# Patient Record
Sex: Female | Born: 1955 | Race: Black or African American | Hispanic: No | Marital: Married | Smoking: Never smoker
Health system: Southern US, Community
[De-identification: ages and names within clinical notes are randomized; demographics above are authoritative.]

## PROBLEM LIST (undated history)

## (undated) DIAGNOSIS — J45991 Cough variant asthma: Secondary | ICD-10-CM

## (undated) DIAGNOSIS — I10 Essential (primary) hypertension: Secondary | ICD-10-CM

## (undated) DIAGNOSIS — J320 Chronic maxillary sinusitis: Secondary | ICD-10-CM

## (undated) DIAGNOSIS — I7300 Raynaud's syndrome without gangrene: Secondary | ICD-10-CM

## (undated) DIAGNOSIS — R928 Other abnormal and inconclusive findings on diagnostic imaging of breast: Secondary | ICD-10-CM

## (undated) DIAGNOSIS — K449 Diaphragmatic hernia without obstruction or gangrene: Secondary | ICD-10-CM

## (undated) DIAGNOSIS — K648 Other hemorrhoids: Secondary | ICD-10-CM

## (undated) DIAGNOSIS — K219 Gastro-esophageal reflux disease without esophagitis: Secondary | ICD-10-CM

## (undated) HISTORY — DX: Other abnormal and inconclusive findings on diagnostic imaging of breast: R92.8

## (undated) HISTORY — DX: Essential (primary) hypertension: I10

## (undated) HISTORY — DX: Diaphragmatic hernia without obstruction or gangrene: K44.9

## (undated) HISTORY — DX: Chronic maxillary sinusitis: J32.0

## (undated) HISTORY — DX: Cough variant asthma: J45.991

## (undated) HISTORY — DX: Raynaud's syndrome without gangrene: I73.00

## (undated) HISTORY — DX: Gastro-esophageal reflux disease without esophagitis: K21.9

## (undated) HISTORY — DX: Other hemorrhoids: K64.8

## (undated) HISTORY — PX: CHOLECYSTECTOMY: SHX55

---

## 1992-09-23 HISTORY — PX: KNEE SURGERY: SHX244

## 1994-09-23 HISTORY — PX: OTHER SURGICAL HISTORY: SHX169

## 2001-05-21 ENCOUNTER — Ambulatory Visit (HOSPITAL_COMMUNITY): Admission: RE | Admit: 2001-05-21 | Discharge: 2001-05-21 | Payer: Self-pay | Admitting: Gastroenterology

## 2001-05-21 ENCOUNTER — Encounter: Payer: Self-pay | Admitting: Gastroenterology

## 2001-09-15 ENCOUNTER — Emergency Department (HOSPITAL_COMMUNITY): Admission: EM | Admit: 2001-09-15 | Discharge: 2001-09-15 | Payer: Self-pay | Admitting: Emergency Medicine

## 2002-03-15 ENCOUNTER — Encounter: Payer: Self-pay | Admitting: Gastroenterology

## 2002-03-15 ENCOUNTER — Ambulatory Visit (HOSPITAL_COMMUNITY): Admission: RE | Admit: 2002-03-15 | Discharge: 2002-03-15 | Payer: Self-pay | Admitting: Gastroenterology

## 2004-11-30 ENCOUNTER — Ambulatory Visit: Payer: Self-pay | Admitting: Internal Medicine

## 2005-01-17 ENCOUNTER — Ambulatory Visit: Payer: Self-pay | Admitting: Internal Medicine

## 2005-03-20 ENCOUNTER — Ambulatory Visit: Payer: Self-pay | Admitting: Internal Medicine

## 2005-06-05 ENCOUNTER — Ambulatory Visit: Payer: Self-pay | Admitting: Internal Medicine

## 2005-06-25 ENCOUNTER — Ambulatory Visit: Payer: Self-pay | Admitting: Internal Medicine

## 2005-07-26 ENCOUNTER — Ambulatory Visit: Payer: Self-pay | Admitting: Internal Medicine

## 2005-07-31 ENCOUNTER — Ambulatory Visit: Payer: Self-pay | Admitting: Internal Medicine

## 2005-09-26 ENCOUNTER — Ambulatory Visit: Payer: Self-pay | Admitting: Internal Medicine

## 2005-11-13 ENCOUNTER — Ambulatory Visit: Payer: Self-pay | Admitting: Internal Medicine

## 2005-12-11 ENCOUNTER — Ambulatory Visit: Payer: Self-pay | Admitting: Internal Medicine

## 2006-02-28 ENCOUNTER — Ambulatory Visit: Payer: Self-pay | Admitting: Internal Medicine

## 2006-04-07 ENCOUNTER — Ambulatory Visit: Payer: Self-pay | Admitting: Internal Medicine

## 2006-06-24 ENCOUNTER — Ambulatory Visit: Payer: Self-pay | Admitting: Gastroenterology

## 2006-09-11 ENCOUNTER — Ambulatory Visit: Payer: Self-pay | Admitting: Family Medicine

## 2006-09-30 ENCOUNTER — Encounter: Payer: Self-pay | Admitting: Internal Medicine

## 2006-09-30 ENCOUNTER — Ambulatory Visit: Payer: Self-pay | Admitting: Gastroenterology

## 2006-09-30 LAB — HM COLONOSCOPY

## 2006-11-28 ENCOUNTER — Ambulatory Visit: Payer: Self-pay | Admitting: Internal Medicine

## 2006-12-30 ENCOUNTER — Ambulatory Visit: Payer: Self-pay | Admitting: Internal Medicine

## 2007-07-16 ENCOUNTER — Encounter: Payer: Self-pay | Admitting: Internal Medicine

## 2007-07-28 ENCOUNTER — Encounter: Payer: Self-pay | Admitting: Internal Medicine

## 2007-12-15 ENCOUNTER — Ambulatory Visit: Payer: Self-pay | Admitting: Internal Medicine

## 2007-12-15 DIAGNOSIS — J309 Allergic rhinitis, unspecified: Secondary | ICD-10-CM | POA: Insufficient documentation

## 2007-12-15 DIAGNOSIS — J320 Chronic maxillary sinusitis: Secondary | ICD-10-CM

## 2007-12-15 DIAGNOSIS — K648 Other hemorrhoids: Secondary | ICD-10-CM

## 2007-12-15 DIAGNOSIS — K219 Gastro-esophageal reflux disease without esophagitis: Secondary | ICD-10-CM | POA: Insufficient documentation

## 2007-12-15 DIAGNOSIS — K449 Diaphragmatic hernia without obstruction or gangrene: Secondary | ICD-10-CM

## 2007-12-15 DIAGNOSIS — I10 Essential (primary) hypertension: Secondary | ICD-10-CM | POA: Insufficient documentation

## 2007-12-15 DIAGNOSIS — I7300 Raynaud's syndrome without gangrene: Secondary | ICD-10-CM

## 2007-12-15 HISTORY — DX: Other hemorrhoids: K64.8

## 2007-12-15 HISTORY — DX: Chronic maxillary sinusitis: J32.0

## 2007-12-15 HISTORY — DX: Diaphragmatic hernia without obstruction or gangrene: K44.9

## 2007-12-15 HISTORY — DX: Raynaud's syndrome without gangrene: I73.00

## 2008-01-12 ENCOUNTER — Ambulatory Visit: Payer: Self-pay | Admitting: Internal Medicine

## 2008-05-10 ENCOUNTER — Encounter: Payer: Self-pay | Admitting: Internal Medicine

## 2008-05-23 ENCOUNTER — Ambulatory Visit: Payer: Self-pay | Admitting: Internal Medicine

## 2008-08-01 ENCOUNTER — Telehealth: Payer: Self-pay | Admitting: *Deleted

## 2008-08-01 DIAGNOSIS — R928 Other abnormal and inconclusive findings on diagnostic imaging of breast: Secondary | ICD-10-CM

## 2008-08-01 HISTORY — DX: Other abnormal and inconclusive findings on diagnostic imaging of breast: R92.8

## 2008-08-03 ENCOUNTER — Encounter: Payer: Self-pay | Admitting: Internal Medicine

## 2008-08-09 ENCOUNTER — Ambulatory Visit: Payer: Self-pay | Admitting: Internal Medicine

## 2008-08-09 LAB — CONVERTED CEMR LAB
BUN: 10 mg/dL (ref 6–23)
Basophils Absolute: 0 10*3/uL (ref 0.0–0.1)
Basophils Relative: 0.5 % (ref 0.0–3.0)
CO2: 30 meq/L (ref 19–32)
Calcium: 9 mg/dL (ref 8.4–10.5)
Chloride: 107 meq/L (ref 96–112)
Creatinine, Ser: 0.8 mg/dL (ref 0.4–1.2)
Eosinophils Absolute: 0.1 10*3/uL (ref 0.0–0.7)
Eosinophils Relative: 2.4 % (ref 0.0–5.0)
GFR calc Af Amer: 97 mL/min
GFR calc non Af Amer: 80 mL/min
Glucose, Bld: 91 mg/dL (ref 70–99)
HCT: 38.1 % (ref 36.0–46.0)
Hemoglobin: 13 g/dL (ref 12.0–15.0)
Hgb A1c MFr Bld: 5.8 % (ref 4.6–6.0)
Lymphocytes Relative: 42.6 % (ref 12.0–46.0)
MCHC: 34.1 g/dL (ref 30.0–36.0)
MCV: 89 fL (ref 78.0–100.0)
Monocytes Absolute: 0.3 10*3/uL (ref 0.1–1.0)
Monocytes Relative: 6.4 % (ref 3.0–12.0)
Neutro Abs: 1.9 10*3/uL (ref 1.4–7.7)
Neutrophils Relative %: 48.1 % (ref 43.0–77.0)
Platelets: 215 10*3/uL (ref 150–400)
Potassium: 3.5 meq/L (ref 3.5–5.1)
RBC: 4.28 M/uL (ref 3.87–5.11)
RDW: 12.6 % (ref 11.5–14.6)
Sodium: 142 meq/L (ref 135–145)
TSH: 1.41 microintl units/mL (ref 0.35–5.50)
WBC: 4 10*3/uL — ABNORMAL LOW (ref 4.5–10.5)

## 2008-09-12 ENCOUNTER — Telehealth: Payer: Self-pay | Admitting: Internal Medicine

## 2008-10-19 ENCOUNTER — Ambulatory Visit: Payer: Self-pay | Admitting: Internal Medicine

## 2008-10-19 DIAGNOSIS — J45991 Cough variant asthma: Secondary | ICD-10-CM

## 2008-10-19 HISTORY — DX: Cough variant asthma: J45.991

## 2009-06-12 ENCOUNTER — Emergency Department (HOSPITAL_COMMUNITY): Admission: EM | Admit: 2009-06-12 | Discharge: 2009-06-12 | Payer: Self-pay | Admitting: Emergency Medicine

## 2009-09-04 ENCOUNTER — Ambulatory Visit: Payer: Self-pay | Admitting: Internal Medicine

## 2009-09-04 ENCOUNTER — Telehealth: Payer: Self-pay | Admitting: Internal Medicine

## 2009-11-20 ENCOUNTER — Encounter: Payer: Self-pay | Admitting: Internal Medicine

## 2010-02-06 ENCOUNTER — Ambulatory Visit: Payer: Self-pay | Admitting: Family Medicine

## 2010-05-02 ENCOUNTER — Ambulatory Visit: Payer: Self-pay | Admitting: Internal Medicine

## 2010-05-02 DIAGNOSIS — M25579 Pain in unspecified ankle and joints of unspecified foot: Secondary | ICD-10-CM | POA: Insufficient documentation

## 2010-05-03 ENCOUNTER — Ambulatory Visit: Payer: Self-pay | Admitting: Internal Medicine

## 2010-06-06 ENCOUNTER — Ambulatory Visit: Payer: Self-pay | Admitting: Internal Medicine

## 2010-06-20 DIAGNOSIS — S335XXA Sprain of ligaments of lumbar spine, initial encounter: Secondary | ICD-10-CM | POA: Insufficient documentation

## 2010-07-14 ENCOUNTER — Ambulatory Visit: Payer: Self-pay | Admitting: Family Medicine

## 2010-07-16 ENCOUNTER — Telehealth: Payer: Self-pay | Admitting: Internal Medicine

## 2010-09-05 ENCOUNTER — Ambulatory Visit: Payer: Self-pay | Admitting: Internal Medicine

## 2010-10-23 NOTE — Assessment & Plan Note (Signed)
Summary: 1 month rov/njr   Vital Signs:  Patient profile:   55 year old female Height:      67 inches Weight:      218 pounds BMI:     34.27 Temp:     98.2 degrees F oral Pulse rate:   76 / minute Resp:     14 per minute BP sitting:   140 / 80  (left arm)  Vitals Entered By: Willy Eddy, [name] (June 06, 2010 4:45 PM) CC: roa- pt states she doesnt take any meds and didnt get prednisone or flexeril filled-requesting lxray results Is Patient Diabetic? No   Primary Care Provider:  Stacie Glaze MD  CC:  roa- pt states she doesnt take any meds and didnt get prednisone or flexeril filled-requesting lxray results.  History of Present Illness: pt had presented with acute back pain with symptosm consistant with sciatica she was given a course of steroids and muscle relaxants and lumbar films with flexion and extension were ordered she did not start the steroids the back pain has resolved and the xray did not show evidence of loss of disc height or spodylosis she has a hx of recurrent muscle spasm of back   Preventive Screening-Counseling & Management  Alcohol-Tobacco     Smoking Status: never  Problems Prior to Update: 1)  Ankle Pain, Right  (ICD-719.47) 2)  Sciatica  (ICD-724.3) 3)  Acute Sinusitis, Unspecified  (ICD-461.9) 4)  Cough Variant Asthma  (ICD-493.82) 5)  Chronic Maxillary Sinusitis  (ICD-473.0) 6)  Mammogram, Abnormal  (ICD-793.80) 7)  Weight Gain  (ICD-783.1) 8)  Chronic Maxillary Sinusitis  (ICD-473.0) 9)  Family History Diabetes 1st Degree Relative  (ICD-V18.0) 10)  Hypertension  (ICD-401.9) 11)  Raynaud's Disease  (ICD-443.0) 12)  Hiatal Hernia  (ICD-553.3) 13)  Hemorrhoids, Internal  (ICD-455.0) 14)  Allergic Rhinitis  (ICD-477.9) 15)  Gerd  (ICD-530.81)  Medications Prior to Update: 1)  Omeprazole 40 Mg Cpdr (Omeprazole) .... One By Mouth Daily 2)  Singulair 10 Mg  Tabs (Montelukast Sodium) .... Once Daily As Needed 3)  Cyclobenzaprine Hcl  10 Mg Tabs (Cyclobenzaprine Hcl) .... One By Mouth Three Times A Day For 10 Days 4)  Prednisone 20 Mg Tabs (Prednisone) .... Two By Mouth For 4 Days The 1 By Mouth For 4 Days The 1/2 By Mouth For 4 Days 5)  Lodrane 24 12 Mg Xr24h-Cap (Brompheniramine Maleate) .... One By Mouth Daily  Current Medications (verified): 1)  Singulair 10 Mg  Tabs (Montelukast Sodium) .... Once Daily As Needed 2)  Lodrane 24 12 Mg Xr24h-Cap (Brompheniramine Maleate) .... One By Mouth Daily As Needed 3)  Etodolac 300 Mg Caps (Etodolac) .... One By Mouth Two Times A Day  Call If This Medication Causes Stomach Pain 4)  Cyclobenzaprine Hcl 10 Mg Tabs (Cyclobenzaprine Hcl) .... One By Mouth Three Times A Day As Needed Spasm  Allergies (verified): No Known Drug Allergies  Past History:  Family History: Last updated: 12/15/2007 Father: HTN Mother: [id number] PULSE Siblings: 2 brothers and 2 sisters Family History Diabetes 1st degree relative Family History Hypertension  Social History: Last updated: 12/15/2007 Married Never Smoked  Risk Factors: Smoking Status: never (06/06/2010)  Past medical, surgical, family and social histories (including risk factors) reviewed, and no changes noted (except as noted below).  Past Medical History: Reviewed history from 12/15/2007 and no changes required. GERD Allergic rhinitis Hypertension  Past Surgical History: Reviewed history from 12/15/2007 and no changes required. Cholecystectomy 1995 knee surgery  1994 trauma to arm in machine 1996  Family History: Reviewed history from 12/15/2007 and no changes required. Father: HTN Mother: [id number] PULSE Siblings: 2 brothers and 2 sisters Family History Diabetes 1st degree relative Family History Hypertension  Social History: Reviewed history from 12/15/2007 and no changes required. Married Never Smoked  Review of Systems  The patient denies anorexia, fever, weight loss, weight gain, vision loss,  decreased hearing, hoarseness, chest pain, syncope, dyspnea on exertion, peripheral edema, prolonged cough, headaches, hemoptysis, abdominal pain, melena, hematochezia, severe indigestion/heartburn, hematuria, incontinence, genital sores, muscle weakness, suspicious skin lesions, transient blindness, difficulty walking, depression, unusual weight change, abnormal bleeding, enlarged lymph nodes, angioedema, and breast masses.    Physical Exam  General:  Well-developed,well-nourished,in no acute distress; alert,appropriate and cooperative throughout examination Head:  Normocephalic and atraumatic without obvious abnormalities. No apparent alopecia or balding. Eyes:  pupils equal and pupils round.   Ears:  R ear normal and L ear normal.   Neck:  No deformities, masses, or tenderness noted. Lungs:  Normal respiratory effort, chest expands symmetrically. Lungs are clear to auscultation, no crackles or wheezes. Heart:  Normal rate and regular rhythm. S1 and S2 normal without gallop, murmur, click, rub or other extra sounds. Abdomen:  Bowel sounds positive,abdomen soft and non-tender without masses, organomegaly or hernias noted. Msk:  no joint instability and trigger point tenderness.   Extremities:  No clubbing, cyanosis, edema, or deformity noted with normal full range of motion of all joints.   Neurologic:  No cranial nerve deficits noted. Station and gait are normal. Plantar reflexes are down-going bilaterally. DTRs are symmetrical throughout. Sensory, motor and coordinative functions appear intact.   Impression & Recommendations:  Problem # 1:  SCIATICA (ICD-724.3) the pt  did not complete the medication she feels better, diagnosis revied to muscel spasms The following medications were removed from the medication list:    Cyclobenzaprine Hcl 10 Mg Tabs (Cyclobenzaprine hcl) ..... One by mouth three times a day for 10 days Her updated medication list for this problem includes:    Etodolac 300  Mg Caps (Etodolac) ..... One by mouth two times a day  call if this medication causes stomach pain    Cyclobenzaprine Hcl 10 Mg Tabs (Cyclobenzaprine hcl) ..... One by mouth three times a day as needed spasm  Discussed use of moist heat or ice, modified activities, medications, and stretching/strengthening exercises. Back care instructions given. To be seen in 2 weeks if no improvement; sooner if worsening of symptoms.   Problem # 2:  HYPERTENSION (ICD-401.9) Assessment: Unchanged  BP today: 140/80 Prior BP: 130/80 (05/02/2010)  Prior 10 Yr Risk Heart Disease: Not enough information (12/15/2007)  Labs Reviewed: K+: 3.5 (08/09/2008) Creat: : 0.8 (08/09/2008)     Problem # 3:  LUMBAR STRAIN (ICD-847.2) posture, core strengthening and strething  Complete Medication List: 1)  Singulair 10 Mg Tabs (Montelukast sodium) .... Once daily as needed 2)  Lodrane 24 12 Mg Xr24h-cap (Brompheniramine maleate) .... One by mouth daily as needed 3)  Etodolac 300 Mg Caps (Etodolac) .... One by mouth two times a day  call if this medication causes stomach pain 4)  Cyclobenzaprine Hcl 10 Mg Tabs (Cyclobenzaprine hcl) .... One by mouth three times a day as needed spasm  Patient Instructions: 1)  Please schedule a follow-up appointment in 3 months. 2)  use peroxide and warm water to clean ears Prescriptions: CYCLOBENZAPRINE HCL 10 MG TABS (CYCLOBENZAPRINE HCL) one by mouth three times a day as needed spasm  #  30 x 0   Entered and Authorized by:   Stacie Glaze MD   Signed by:   Stacie Glaze MD on 06/06/2010   Method used:   Electronically to        [facility] (retail)       [address]       [place], [place]  [address]       Ph: [phone]       Fax: [phone]   RxID:   [id number] ETODOLAC 300 MG CAPS (ETODOLAC) one by mouth two times a day  call if this medication causes stomach pain  #60 x 3   Entered and Authorized by:   Stacie Glaze MD   Signed  by:   Stacie Glaze MD on 06/06/2010   Method used:   Electronically to        [facility] (retail)       [address]       [place], [place]  [address]       Ph: [phone]       Fax: [phone]   RxID:   [id number]

## 2010-10-23 NOTE — Assessment & Plan Note (Signed)
Summary: consult re: pain in lft/cjr   Vital Signs:  Patient profile:   55 year old female Height:      67 inches Weight:      214 pounds BMI:     33.64 Temp:     98.2 degrees F oral Pulse rate:   76 / minute Resp:     14 per minute BP sitting:   130 / 80  (left arm)  Vitals Entered By: Willy Eddy, [name] (May 02, 2010 4:32 PM) CC: c/o bilateral leg pain Is Patient Diabetic? No   CC:  c/o bilateral leg pain.  History of Present Illness: severe myalgias in legs sinus pain the left leg feels "sore to the bone" all down the legs the right leg hurts at the ankle she has no shoulder pain or arm pain she has mild SI joint pain and pan n the left groin  the pain interfers with sleep  she lost her mother 2 weeks ago and has increased grief  Preventive Screening-Counseling & Management  Alcohol-Tobacco     Smoking Status: never  Problems Prior to Update: 1)  Sciatica  (ICD-724.3) 2)  Acute Sinusitis, Unspecified  (ICD-461.9) 3)  Cough Variant Asthma  (ICD-493.82) 4)  Chronic Maxillary Sinusitis  (ICD-473.0) 5)  Mammogram, Abnormal  (ICD-793.80) 6)  Weight Gain  (ICD-783.1) 7)  Chronic Maxillary Sinusitis  (ICD-473.0) 8)  Family History Diabetes 1st Degree Relative  (ICD-V18.0) 9)  Hypertension  (ICD-401.9) 10)  Raynaud's Disease  (ICD-443.0) 11)  Hiatal Hernia  (ICD-553.3) 12)  Hemorrhoids, Internal  (ICD-455.0) 13)  Allergic Rhinitis  (ICD-477.9) 14)  Gerd  (ICD-530.81)  Current Problems (verified): 1)  Sciatica  (ICD-724.3) 2)  Acute Sinusitis, Unspecified  (ICD-461.9) 3)  Cough Variant Asthma  (ICD-493.82) 4)  Chronic Maxillary Sinusitis  (ICD-473.0) 5)  Mammogram, Abnormal  (ICD-793.80) 6)  Weight Gain  (ICD-783.1) 7)  Chronic Maxillary Sinusitis  (ICD-473.0) 8)  Family History Diabetes 1st Degree Relative  (ICD-V18.0) 9)  Hypertension  (ICD-401.9) 10)  Raynaud's Disease  (ICD-443.0) 11)  Hiatal Hernia  (ICD-553.3) 12)  Hemorrhoids, Internal   (ICD-455.0) 13)  Allergic Rhinitis  (ICD-477.9) 14)  Gerd  (ICD-530.81)  Medications Prior to Update: 1)  Protonix 40 Mg  Tbec (Pantoprazole Sodium) .... Once Daily 2)  Singulair 10 Mg  Tabs (Montelukast Sodium) .... Once Daily 3)  Patanol 0.1 % Soln (Olopatadine Hcl) .... Two Sprays in Nostril Daily 4)  Amoxicillin-Pot Clavulanate 875-125 Mg Tabs (Amoxicillin-Pot Clavulanate) .... One By Mouth Two Times A Day For 10 Mdays 5)  Atuss Ds 30-4-30 Mg/96ml Susp (Pseudoephed Hcl-Cpm-Dm Hbr Tan) .... Two Tsp By Mouth Two Times A Day 6)  Lansoprazole 30 Mg Cpdr (Lansoprazole) .... One By Mouth Daily  Current Medications (verified): 1)  Omeprazole 40 Mg Cpdr (Omeprazole) .... One By Mouth Daily 2)  Singulair 10 Mg  Tabs (Montelukast Sodium) .... Once Daily As Needed 3)  Cyclobenzaprine Hcl 10 Mg Tabs (Cyclobenzaprine Hcl) .... One By Mouth Three Times A Day For 10 Days 4)  Prednisone 20 Mg Tabs (Prednisone) .... Two By Mouth For 4 Days The 1 By Mouth For 4 Days The 1/2 By Mouth For 4 Days  Allergies (verified): No Known Drug Allergies  Past History:  Family History: Last updated: 12/15/2007 Father: HTN Mother: [id number] PULSE Siblings: 2 brothers and 2 sisters Family History Diabetes 1st degree relative Family History Hypertension  Social History: Last updated: 12/15/2007 Married Never Smoked  Risk Factors: Smoking Status: never (05/02/2010)  Past medical, surgical, family and social histories (including risk factors) reviewed, and no changes noted (except as noted below).  Past Medical History: Reviewed history from 12/15/2007 and no changes required. GERD Allergic rhinitis Hypertension  Past Surgical History: Reviewed history from 12/15/2007 and no changes required. Cholecystectomy 1995 knee surgery 1994 trauma to arm in machine 1996  Family History: Reviewed history from 12/15/2007 and no changes required. Father: HTN Mother: ZO:[id number] PULSE Siblings: 2  brothers and 2 sisters Family History Diabetes 1st degree relative Family History Hypertension  Social History: Reviewed history from 12/15/2007 and no changes required. Married Never Smoked  Review of Systems       The patient complains of hoarseness.  The patient denies anorexia, fever, weight loss, weight gain, vision loss, decreased hearing, chest pain, syncope, dyspnea on exertion, peripheral edema, prolonged cough, headaches, hemoptysis, abdominal pain, melena, hematochezia, severe indigestion/heartburn, hematuria, incontinence, genital sores, muscle weakness, suspicious skin lesions, transient blindness, difficulty walking, depression, unusual weight change, abnormal bleeding, enlarged lymph nodes, angioedema, and breast masses.         leg pain  Physical Exam  General:  Well-developed,well-nourished,in no acute distress; alert,appropriate and cooperative throughout examination Head:  Normocephalic and atraumatic without obvious abnormalities. No apparent alopecia or balding. Eyes:  pupils equal and pupils round.   Ears:  R ear normal and L ear normal.   Nose:  swollen nasal mucosa otherwise no acute change Neck:  No deformities, masses, or tenderness noted. Lungs:  Normal respiratory effort, chest expands symmetrically. Lungs are clear to auscultation, no crackles or wheezes. Heart:  Normal rate and regular rhythm. S1 and S2 normal without gallop, murmur, click, rub or other extra sounds. Abdomen:  Bowel sounds positive,abdomen soft and non-tender without masses, organomegaly or hernias noted. Msk:  normal ROM and no joint instability.   Extremities:  No clubbing, cyanosis, edema, or deformity noted with normal full range of motion of all joints.   Neurologic:  alert & oriented X3, cranial nerves II-XII intact, strength normal in all extremities, sensation intact to light touch, and DTRs symmetrical and normal.     Impression & Recommendations:  Problem # 1:  HYPERTENSION  (ICD-401.9)  BP today: 130/80 Prior BP: 110/80 (02/06/2010)  Prior 10 Yr Risk Heart Disease: Not enough information (12/15/2007)  Labs Reviewed: K+: 3.5 (08/09/2008) Creat: : 0.8 (08/09/2008)     Problem # 2:  SCIATICA (ICD-724.3)  Discussed use of moist heat or ice, modified activities, medications, and stretching/strengthening exercises. Back care instructions given. To be seen in 2 weeks if no improvement; sooner if worsening of symptoms.   Her updated medication list for this problem includes:    Cyclobenzaprine Hcl 10 Mg Tabs (Cyclobenzaprine hcl) ..... One by mouth three times a day for 10 days  Orders: T-Lumbar Spine w/Flex & Ext 4 Views (72120TC)  Problem # 3:  HIATAL HERNIA (ICD-553.3)  The following medications were removed from the medication list:    Lansoprazole 30 Mg Cpdr (Lansoprazole) ..... One by mouth daily Her updated medication list for this problem includes:    Omeprazole 40 Mg Cpdr (Omeprazole) ..... One by mouth daily  Labs Reviewed: Hgb: 13.0 (08/09/2008)   Hct: 38.1 (08/09/2008)  Problem # 4:  ANKLE PAIN, RIGHT (ICD-719.47)  wrapped  Orders: [place] Wrap,  <3 in/yd (J8119)  Complete Medication List: 1)  Omeprazole 40 Mg Cpdr (Omeprazole) .... One by mouth daily 2)  Singulair 10 Mg Tabs (Montelukast sodium) .... Once daily as needed 3)  Cyclobenzaprine Hcl  10 Mg Tabs (Cyclobenzaprine hcl) .... One by mouth three times a day for 10 days 4)  Prednisone 20 Mg Tabs (Prednisone) .... Two by mouth for 4 days the 1 by mouth for 4 days the 1/2 by mouth for 4 days 5)  Lodrane 24 12 Mg Xr24h-cap (Brompheniramine maleate) .... One by mouth daily  Patient Instructions: 1)  Please schedule a follow-up appointment in 1 month. Prescriptions: OMEPRAZOLE 40 MG CPDR (OMEPRAZOLE) one by mouth daily  #30 x 3   Entered and Authorized by:   Stacie Glaze MD   Signed by:   Stacie Glaze MD on 05/02/2010   Method used:   Electronically to        [facility] (retail)       [address]       [place], [place]  [address]       Ph: [phone]       Fax: [phone]   RxID:   [id number] PREDNISONE 20 MG TABS (PREDNISONE) two by mouth for 4 days the 1 by mouth for 4 days the 1/2 by mouth for 4 days  #14 x 0   Entered and Authorized by:   Stacie Glaze MD   Signed by:   Stacie Glaze MD on 05/02/2010   Method used:   Electronically to        [facility] (retail)       [address]       [place], [place]  [address]       Ph: [phone]       Fax: [phone]   RxID:   [id number] CYCLOBENZAPRINE HCL 10 MG TABS (CYCLOBENZAPRINE HCL) one by mouth three times a day for 10 days  #30 x 0   Entered and Authorized by:   Stacie Glaze MD   Signed by:   Stacie Glaze MD on 05/02/2010   Method used:   Electronically to        [facility] (retail)       [address]       [place], [place]  [address]       Ph: [phone]       Fax: [phone]   RxID:   [id number]

## 2010-10-23 NOTE — Assessment & Plan Note (Signed)
Summary: SINUSITIS// RS   Vital Signs:  Patient profile:   55 year old female Temp:     98.3 degrees F oral BP sitting:   110 / 80  (left arm) Cuff size:   regular  Vitals Entered By: Sid Falcon [name] (Feb 06, 2010 11:37 AM) CC: sinusitis   History of Present Illness: Patient seen for the following items.  One-month history of sinus pressure, pain, and nasal congestion. Intermittent greenish nasal discharge. Mostly postnasal drainage. Intermittent cough. Intermittent headache. Increased malaise. No fever or chills. Claritin without relief. History of frequent sinusitis in past. Nonsmoker. Nasal saline irrigation without improvement.  One to two-week history left lower extremity pain radiating from the lower lumbar area. Achy pain 7/10 at worst. Occasional numbness left foot. No weakness. No incontinence. Has not tried any medications. Possibly exacerbated by sitting. No history of chronic pain in back.  no appetite or weight changes  Allergies (verified): No Known Drug Allergies  Past History:  Past Medical History: Last updated: 12/15/2007 GERD Allergic rhinitis Hypertension  Past Surgical History: Last updated: 12/15/2007 Cholecystectomy 1995 knee surgery 1994 trauma to arm in machine 1996  Family History: Last updated: 12/15/2007 Father: HTN Mother: [id number] PULSE Siblings: 2 brothers and 2 sisters Family History Diabetes 1st degree relative Family History Hypertension  Social History: Last updated: 12/15/2007 Married Never Smoked  Risk Factors: Smoking Status: never (12/15/2007)  Review of Systems  The patient denies anorexia, fever, weight loss, chest pain, syncope, dyspnea on exertion, peripheral edema, prolonged cough, abdominal pain, melena, hematochezia, hematuria, incontinence, and muscle weakness.    Physical Exam  General:  Well-developed,well-nourished,in no acute distress; alert,appropriate and cooperative throughout examination Ears:   External ear exam shows no significant lesions or deformities.  Otoscopic examination reveals clear canals, tympanic membranes are intact bilaterally without bulging, retraction, inflammation or discharge. Hearing is grossly normal bilaterally. Nose:  swollen nasal mucosa otherwise no acute change Mouth:  Oral mucosa and oropharynx without lesions or exudates.  Teeth in good repair. Neck:  No deformities, masses, or tenderness noted. Lungs:  Normal respiratory effort, chest expands symmetrically. Lungs are clear to auscultation, no crackles or wheezes. Heart:  Normal rate and regular rhythm. S1 and S2 normal without gallop, murmur, click, rub or other extra sounds. Extremities:  No clubbing, cyanosis, edema, or deformity noted with normal full range of motion of all joints.   Neurologic:  alert & oriented X3, cranial nerves II-XII intact, strength normal in all extremities, sensation intact to light touch, and DTRs symmetrical and normal.     Impression & Recommendations:  Problem # 1:  ACUTE SINUSITIS, UNSPECIFIED (ICD-461.9) Assessment New  Her updated medication list for this problem includes:    Amoxicillin-pot Clavulanate 875-125 Mg Tabs (Amoxicillin-pot clavulanate) ..... One by mouth two times a day for 10 mdays    Atuss Ds 30-4-30 Mg/46ml Susp (Pseudoephed hcl-cpm-dm hbr tan) .Marland Kitchen..Marland Kitchen Two tsp by mouth two times a day  Problem # 2:  SCIATICA (ICD-724.3) Assessment: New nonfocal neuro exam.  Rec trial of OTC NSAID and f/u with primary in 2-3 weeks if no better.  Complete Medication List: 1)  Protonix 40 Mg Tbec (Pantoprazole sodium) .... Once daily 2)  Singulair 10 Mg Tabs (Montelukast sodium) .... Once daily 3)  Patanol 0.1 % Soln (Olopatadine hcl) .... Two sprays in nostril daily 4)  Amoxicillin-pot Clavulanate 875-125 Mg Tabs (Amoxicillin-pot clavulanate) .... One by mouth two times a day for 10 mdays 5)  Atuss Ds 30-4-30 Mg/38ml Susp (Pseudoephed  hcl-cpm-dm hbr tan) .... Two tsp by  mouth two times a day 6)  Lansoprazole 30 Mg Cpdr (Lansoprazole) .... One by mouth daily  Patient Instructions: 1)  Try Advil or Aleve for the next couple of weeks for back pain. 2)  Followup with Dr. Lovell Sheehan if back pain not resolving over the next couple of weeks Prescriptions: SINGULAIR 10 MG  TABS (MONTELUKAST SODIUM) once daily  #30 x 6   Entered by:   Sid Falcon [name]   Authorized by:   Evelena Peat MD   Signed by:   Sid Falcon [name] on 60/45/4098   Method used:   Electronically to        [facility] (retail)       [address]       [place], [place]  [address]       Ph: [phone]       Fax: [phone]   RxID:   [id number] AMOXICILLIN-POT CLAVULANATE 875-125 MG TABS (AMOXICILLIN-POT CLAVULANATE) one by mouth two times a day for 10 mdays  #20 x 0   Entered and Authorized by:   Evelena Peat MD   Signed by:   Evelena Peat MD on 02/06/2010   Method used:   Print then Give to Patient   RxID:   [id number]

## 2010-10-23 NOTE — Assessment & Plan Note (Signed)
Summary: HEADACHE/DIZZINESS/RCD   Vital Signs:  Patient profile:   55 year old female Weight:      215 pounds BMI:     33.80 O2 Sat:      99 % on Room air Temp:     99.0 degrees F oral Pulse rate:   88 / minute BP sitting:   134 / 82  (left arm)  Vitals Entered By: Doristine Devoid CMA (July 14, 2010 11:10 AM)  O2 Flow:  Room air CC: HA and sinus pressure had episode of dizziness and cough     History of Present Illness: 55 yo woman here today for dizziness.  occurred yesterday while at [employer].  drank water and sxs improved.  was able to finish workout.  'it's like a humming sound in my head'.  went to bed early.  woke w/ 'dizzy headache'.  took a benadryl w/ some improvement.  woke this AM w/ increased head pressure, facial pain, tooth pain, ears clogged.  now w/ cough this AM.  no fevers at home.  had URI 2 weeks ago w/ some improvement.  'i just don't feel good'.  Current Medications (verified): 1)  Singulair 10 Mg  Tabs (Montelukast Sodium) .... Once Daily As Needed 2)  Lodrane 24 12 Mg Xr24h-Cap (Brompheniramine Maleate) .... One By Mouth Daily As Needed 3)  Etodolac 300 Mg Caps (Etodolac) .... One By Mouth Two Times A Day  Call If This Medication Causes Stomach Pain 4)  Cyclobenzaprine Hcl 10 Mg Tabs (Cyclobenzaprine Hcl) .... One By Mouth Three Times A Day As Needed Spasm 5)  Amoxicillin 500 Mg Tabs (Amoxicillin) .... 2 Tabs By Mouth Two Times A Day X10 Day.  Take W/ Food. 6)  Tessalon 200 Mg Caps (Benzonatate) .... Take One Capsule By Mouth Three Times A Day As Needed For Cough  Allergies (verified): No Known Drug Allergies  Review of Systems      See HPI  Physical Exam  General:  uncomfortable appearing Head:  + TTP over frontal and maxillary sinuses Eyes:  no injxn or inflammation, PERRL, EOMI Ears:  R ear normal and L ear normal.   Nose:  marked turbinate edema and congestion Mouth:  Oral mucosa and oropharynx without lesions or exudates.  Teeth in good  repair. Neck:  No deformities, masses, or tenderness noted. Lungs:  Normal respiratory effort, chest expands symmetrically. Lungs are clear to auscultation, no crackles or wheezes.  + hacking cough during exam Heart:  Normal rate and regular rhythm. S1 and S2 normal without gallop, murmur, click, rub or other extra sounds. Neurologic:  alert & oriented X3, cranial nerves II-XII intact, strength normal in all extremities, sensation intact to light touch, gait normal, and DTRs symmetrical and normal.   Cervical Nodes:  bilateral LAD   Impression & Recommendations:  Problem # 1:  ACUTE SINUSITIS, UNSPECIFIED (ICD-461.9) Assessment Unchanged pt's sinus infxn likely the cause of her dizziness- no red flags on PE.  start Amox.  tessalon for cough.  reviewed supportive care and red flags that should prompt return.  Pt expresses understanding and is in agreement w/ this plan. Her updated medication list for this problem includes:    Amoxicillin 500 Mg Tabs (Amoxicillin) .Marland Kitchen... 2 tabs by mouth two times a day x10 day.  take w/ food.    Tessalon 200 Mg Caps (Benzonatate) .Marland Kitchen... Take one capsule by mouth three times a day as needed for cough  Complete Medication List: 1)  Singulair 10 Mg Tabs (Montelukast  sodium) .... Once daily as needed 2)  Lodrane 24 12 Mg Xr24h-cap (Brompheniramine maleate) .... One by mouth daily as needed 3)  Etodolac 300 Mg Caps (Etodolac) .... One by mouth two times a day  call if this medication causes stomach pain 4)  Cyclobenzaprine Hcl 10 Mg Tabs (Cyclobenzaprine hcl) .... One by mouth three times a day as needed spasm 5)  Amoxicillin 500 Mg Tabs (Amoxicillin) .... 2 tabs by mouth two times a day x10 day.  take w/ food. 6)  Tessalon 200 Mg Caps (Benzonatate) .... Take one capsule by mouth three times a day as needed for cough  Patient Instructions: 1)  This is a sinus infection 2)  Take the Amoxicillin as directed- take w/ food to avoid upset stomach 3)  Drink plenty of  fluids 4)  Tylenol or ibuprofen for pain and fever 5)  Tessalon as needed for cough- you can add robitussin if needed 6)  Mucinex to thin your chest congestion 7)  Call with any questions or concerns! 8)  REST!! 9)  Hang in there!!! Prescriptions: TESSALON 200 MG CAPS (BENZONATATE) Take one capsule by mouth three times a day as needed for cough  #60 x 0   Entered and Authorized by:   Neena Rhymes MD   Signed by:   Neena Rhymes MD on 07/14/2010   Method used:   Electronically to        [facility] (retail)       [address]       [place], [place]  [address]       Ph: [phone]       Fax: [phone]   RxID:   [id number] AMOXICILLIN 500 MG TABS (AMOXICILLIN) 2 tabs by mouth two times a day x10 day.  take w/ food.  #40 x 0   Entered and Authorized by:   Neena Rhymes MD   Signed by:   Neena Rhymes MD on 07/14/2010   Method used:   Electronically to        [facility] (retail)       [address]       [place], [place]  [address]       Ph: [phone]       Fax: [phone]   RxID:   [id number]    Orders Added: 1)  Est. Patient Level III [607[address]]

## 2010-10-23 NOTE — Progress Notes (Signed)
Summary: Call A Nurse  Phone Note From Other Clinic   Reason for Call: Need Referral Information Summary of Call: was seen at [facility] Initial call taken by: Willy Eddy, [name],  July 16, 2010 11:20 AM     Call-A-Nurse Triage Call Report Triage Record Num: [id number] Operator: Durward Mallard [facility] Patient Name: Holly Arellano Call Date & Time: 07/14/2010 10:22:06AM Patient Phone: [phone] PCP: Darryll Capers Patient Gender: Female PCP Fax : [phone] Patient DOB: Dec 01, 1955 Practice Name: [facility] Reason for Call: Caller states that she has areally bad h/a and face and neck is hurting; also has a cough; this morning head was hurtingto where she was dizzy; still feeling a little dizzy with the headache; rates 9/10; did not take anything for the pain; did take Benadryl; when she lays down feels like it is spinning a little; called back line of office for possible appt today; conferenced with office and appt made for today as soon as possible Protocol(s) Used: Dizziness or Vertigo Recommended Outcome per Protocol: See [name] within 4 hours Reason for Outcome: Having sensations of turning or spinning that affects balance AND not responsive to 4 hours of home care Care Advice:  ~

## 2010-10-25 NOTE — Assessment & Plan Note (Signed)
Summary: 3 month rov/njr   Vital Signs:  Patient profile:   55 year old female Height:      67 inches Weight:      220 pounds BMI:     34.58 Temp:     98.2 degrees F oral Pulse rate:   80 / minute Resp:     14 per minute BP sitting:   140 / 80  (left arm)  Vitals Entered By: Willy Eddy, [name] (September 05, 2010 4:01 PM) CC: roa- c/o couigh and congestion Is Patient Diabetic? No   Primary Care Shatana Saxton:  Stacie Glaze MD  CC:  roa- c/o couigh and congestion.  History of Present Illness: Has noted that her "dizzyness" has worsened. She noted that these episodes have increased. The pt was seen in urgent care and Amoxil was rxed... this helped but the symptoms resumed after she finised the antibiotics. ( the rx was amoxil 500) She states that she feels good intermintantly and has mild head aches. She feels tender on scalp. No severe pain.  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) 9)  Family History Diabetes 1st Degree Relative  (ICD-V18.0) 10)  Hypertension  (ICD-401.9) 11)  Raynaud's Disease  (ICD-443.0) 12)  Hiatal Hernia  (ICD-553.3) 13)  Hemorrhoids, Internal  (ICD-455.0) 14)  Allergic Rhinitis  (ICD-477.9) 15)  Gerd  (ICD-530.81)  Current Problems (verified): 1)  Lumbar Strain  (ICD-847.2) 2)  Ankle Pain, Right  (ICD-719.47) 3)  Acute Sinusitis, Unspecified  (ICD-461.9) 4)  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 Once Daily As Needed 2)  Lodrane 24 12 Mg Xr24h-Cap (Brompheniramine Maleate) .... One By Mouth Daily As Needed 3)  Etodolac 300 Mg Caps (Etodolac) .... One By Mouth Two Times A Day  Call If This Medication Causes Stomach Pain 4)  Cyclobenzaprine Hcl 10 Mg Tabs (Cyclobenzaprine Hcl) .... One By Mouth Three Times A Day As Needed Spasm 5)  Tessalon 200 Mg Caps (Benzonatate) .... Take One Capsule By Mouth Three Times A Day As Needed For Cough  Current Medications (verified): 1)  Singulair 10 Mg  Tabs (Montelukast Sodium) .... Once Daily As Needed 2)  Lodrane 24 12 Mg Xr24h-Cap (Brompheniramine Maleate) .... One By Mouth Daily As Needed 3)  Etodolac 300 Mg Caps (Etodolac) .... One By Mouth Two Times A Day  Call If This Medication Causes Stomach Pain 4)  Cyclobenzaprine Hcl 10 Mg Tabs (Cyclobenzaprine Hcl) .... One By Mouth Three Times A Day As Needed Spasm 5)  Tessalon 200 Mg Caps (Benzonatate) .... Take One Capsule By Mouth Three Times A Day  As Needed For Cough 6)  Smz-Tmp Ds 800-160 Mg Tabs (Sulfamethoxazole-Trimethoprim) .... One By Mouth Two Times A Day 7)  Prednisone 20 Mg Tabs (Prednisone) .... Two By Mouth For 4 Days, Then One For 4 Days Then 1/4 For 4 Days 8)  Bromax 11 Mg Xr12h-Tab (Brompheniramine Maleate) .... One By Mouth Two Times A Day For 10 Days 9)  Lansoprazole 30 Mg Cpdr (Lansoprazole) .... One By Mouth Daily  Allergies (verified): No Known Drug Allergies  Past History:  Family History: Last updated: 12/15/2007 Father: HTN Mother: [id number] PULSE Siblings: 2 brothers and 2 sisters Family History  Diabetes 1st degree relative Family History Hypertension  Social History: Last updated: 12/15/2007 Married Never Smoked  Risk Factors: Smoking Status: never (09/05/2010)  Past medical, surgical, family and social histories (including risk factors) reviewed, and no changes noted (except as noted below).  Past Medical History: Reviewed history from 12/15/2007 and no changes required. GERD Allergic rhinitis Hypertension  Past Surgical History: Reviewed history from 12/15/2007 and no changes required. Cholecystectomy 1995 knee surgery 1994 trauma to arm in machine 1996  Family History: Reviewed history from 12/15/2007 and no changes required. Father: HTN Mother: [id number] PULSE Siblings: 2 brothers and 2 sisters Family History Diabetes 1st degree relative Family History Hypertension  Social History: Reviewed history from 12/15/2007 and no changes required. Married Never Smoked  Review of Systems  The patient denies anorexia, fever, weight loss, weight gain, vision loss, decreased hearing, hoarseness, chest pain, syncope, dyspnea on exertion, peripheral edema, prolonged cough, headaches, hemoptysis, abdominal pain, melena, hematochezia, severe indigestion/heartburn, hematuria, incontinence, genital sores, muscle weakness, suspicious skin lesions, transient blindness, difficulty walking, depression, unusual weight change, abnormal bleeding, enlarged lymph nodes, angioedema, and breast masses.    Physical Exam  General:  uncomfortable appearing Head:  + TTP over frontal and maxillary sinuses Eyes:  pupils equal, pupils round, and nystagmus to the left.   Ears:  R ear normal and L ear normal.   Nose:  marked turbinate edema and congestion Mouth:  Oral mucosa and oropharynx without lesions or exudates.  Teeth in good repair. Neck:  No deformities, masses, or tenderness noted. Lungs:  normal respiratory effort and no wheezes.   Heart:  Normal rate and regular rhythm. S1  and S2 normal without gallop, murmur, click, rub or other extra sounds. Abdomen:  Bowel sounds positive,abdomen soft and non-tender without masses, organomegaly or hernias noted.   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 Take one capsule by mouth three times a day as needed for cough    Smz-tmp Ds 800-160 Mg Tabs (Sulfamethoxazole-trimethoprim) ..... One by mouth two times a day  Take antibiotics for full duration. Discussed treatment options including indications for coronal CT scan of sinuses and ENT referral.   Problem # 2:  HYPERTENSION (ICD-401.9) Assessment: Unchanged  BP today: 140/80 Prior BP: 134/82 (07/14/2010)  Prior 10 Yr Risk Heart Disease: Not enough information (12/15/2007)  Labs Reviewed: K+: 3.5 (08/09/2008) Creat: : 0.8 (08/09/2008)     Complete Medication List: 1)  Singulair 10 Mg Tabs (Montelukast sodium) .... Once daily as needed 2)  Lodrane 24 12 Mg Xr24h-cap (Brompheniramine maleate) .... One by mouth daily as needed 3)  Etodolac 300 Mg Caps (Etodolac) .... One by mouth two times a  day  call if this medication causes stomach pain 4)  Cyclobenzaprine Hcl 10 Mg Tabs (Cyclobenzaprine hcl) .... One by mouth three times a day as needed spasm 5)  Tessalon 200 Mg Caps (Benzonatate) .... Take one capsule by mouth three times a day as needed for cough 6)  Smz-tmp Ds 800-160 Mg Tabs (Sulfamethoxazole-trimethoprim) .... One by mouth two times a day 7)  Prednisone 20 Mg Tabs (Prednisone) .... Two by mouth for 4 days, then one for 4 days then 1/4 for 4 days 8)  Bromax 11 Mg Xr12h-tab (Brompheniramine maleate) .... One by mouth two times a day for 10 days 9)  Lansoprazole 30 Mg Cpdr (Lansoprazole) .... One by  mouth daily  Patient Instructions: 1)  Take your antibiotic as prescribed until ALL of it is gone, but stop if you develop a rash or swelling and contact our office as soon as possible. Prescriptions: LANSOPRAZOLE 30 MG CPDR (LANSOPRAZOLE) one by mouth daily  #30 x 11   Entered and Authorized by:   Stacie Glaze MD   Signed by:   Stacie Glaze MD on 09/05/2010   Method used:   Electronically to        [facility] (retail)       [address]       [place], [place]  [address]       Ph: [phone]       Fax: [phone]   RxID:   [id number] BROMAX 11 MG XR12H-TAB (BROMPHENIRAMINE MALEATE) one by mouth two times a day for 10 days  #20 x 0   Entered and Authorized by:   Stacie Glaze MD   Signed by:   Stacie Glaze MD on 09/05/2010   Method used:   Electronically to        [facility] (retail)       [address]       [place], [place]  [address]       Ph: [phone]       Fax: [phone]   RxID:   [id number] PREDNISONE 20 MG TABS (PREDNISONE) two by mouth for 4 days, then one for 4 days then 1/4 for 4 days  #14 x 0   Entered and Authorized by:   Stacie Glaze MD   Signed by:   Stacie Glaze MD on 09/05/2010   Method used:   Electronically to        [facility] (retail)       [address]       [place], [place]  [address]       Ph: [phone]       Fax: [phone]   RxID:   [id number] SMZ-TMP DS 800-160 MG TABS (SULFAMETHOXAZOLE-TRIMETHOPRIM) one by mouth two times a day  #28 x 0   Entered and Authorized by:   Stacie Glaze MD   Signed by:   Stacie Glaze MD on 09/05/2010   Method used:   Electronically to        [facility] (retail)       [address]       [place], [place]  [address]       Ph: [phone]       Fax:  [phone]   RxID:   [id number]    Orders Added: 1)  Est. Patient Level IV [[address]]

## 2011-01-30 ENCOUNTER — Encounter: Payer: Self-pay | Admitting: Internal Medicine

## 2011-02-04 ENCOUNTER — Encounter: Payer: Self-pay | Admitting: Internal Medicine

## 2011-02-04 ENCOUNTER — Ambulatory Visit (INDEPENDENT_AMBULATORY_CARE_PROVIDER_SITE_OTHER): Payer: 59 | Admitting: Internal Medicine

## 2011-02-04 VITALS — BP 140/80 | HR 76 | Temp 98.2°F | Resp 16 | Ht 66.0 in | Wt 212.0 lb

## 2011-02-04 DIAGNOSIS — I10 Essential (primary) hypertension: Secondary | ICD-10-CM

## 2011-02-04 DIAGNOSIS — J320 Chronic maxillary sinusitis: Secondary | ICD-10-CM

## 2011-02-04 DIAGNOSIS — J309 Allergic rhinitis, unspecified: Secondary | ICD-10-CM

## 2011-02-04 DIAGNOSIS — K219 Gastro-esophageal reflux disease without esophagitis: Secondary | ICD-10-CM

## 2011-02-04 LAB — CBC WITH DIFFERENTIAL/PLATELET
Basophils Absolute: 0 10*3/uL (ref 0.0–0.1)
HCT: 38.6 % (ref 36.0–46.0)
Lymphs Abs: 2.1 10*3/uL (ref 0.7–4.0)
Monocytes Absolute: 0.3 10*3/uL (ref 0.1–1.0)
Monocytes Relative: 6.8 % (ref 3.0–12.0)
Platelets: 221 10*3/uL (ref 150.0–400.0)
RDW: 13.6 % (ref 11.5–14.6)

## 2011-02-04 MED ORDER — ESOMEPRAZOLE MAGNESIUM 40 MG PO CPDR: 40.0000 mg | DELAYED_RELEASE_CAPSULE | Freq: Every day | ORAL | Status: DC

## 2011-02-04 MED ORDER — MOMETASONE FUROATE 50 MCG/ACT NA SUSP: 2.0000 | Freq: Every day | NASAL | Status: DC

## 2011-02-04 MED ORDER — METHYLPREDNISOLONE ACETATE 40 MG/ML IJ SUSP
80.0000 mg | Freq: Once | INTRAMUSCULAR | Status: AC
  Administered 2011-02-04: 80 mg via INTRAMUSCULAR

## 2011-02-04 NOTE — Patient Instructions (Signed)
Esophagitis (Heartburn) Esophagitis (heartburn) is a painful, burning sensation in the chest. It may feel worse in certain positions, such as lying down or bending over. It is caused by stomach acid backing up into the tube that carries food from the mouth down to the stomach (lower esophagus). TREATMENT There are a number of non-prescription medicines used to treat heartburn, including:  Antacids.   Acid reducers (also called H-2 blockers).   Proton-pump inhibitors.  HOME CARE INSTRUCTIONS  Raise the head of your bead by putting blocks under the legs.   Eat 2-3 hours before going to bed.   Stop smoking.   Try to reach and maintain a healthy weight.   Do not eat just a few very large meals. Instead, eat many smaller meals throughout the day.   Try to identify foods and beverages that make your symptoms worse, and avoid these.   Avoid tight clothing.   Do not exercise right after eating.  SEEK IMMEDIATE MEDICAL CARE IF YOU:  Have severe chest pain that goes down your arm, or into your jaw or neck.   Feel sweaty, dizzy, or lightheaded.   Are short of breath.   Throw up (vomit) blood.   Have difficulty or pain with swallowing.   Have bloody or black, tarry stools.   Have bouts of heartburn more than three times a week for more than two weeks.  Document Released: 10/17/2004 Document Re-Released: 12/04/2009 [employer] Patient Information 2011 [employer]. 

## 2011-02-04 NOTE — Progress Notes (Signed)
Subjective:    Patient ID: Holly Arellano, female    DOB: June 27, 1956, 55 y.o.   MRN: [id number]  HPI Patient presents for multiple medical problems.  She has a history of hypertension and is concerned because her pulse drops after exercise we reassured her that this is a normal part of the exercise response.  She has severe allergic rhinitis and is currently on no medicines for allergies on examination today she shows no signs of infection or chronic sinusitis other than chronic allergic changes.  She has severe GERD and has seen Dr. Russella Dar in the past she has had an EGD which showed reflux she is not responding to over-the-counter Prevacid therefore we will place her back on Nexium 40 mg by mouth daily   Review of Systems  Constitutional: Negative.   HENT: Negative.   Respiratory: Negative.   Gastrointestinal: Positive for nausea.       [gerd Genitourinary: Negative.   Neurological: Negative.   Psychiatric/Behavioral: Positive for sleep disturbance.   Past Medical History  Diagnosis Date  . GERD (gastroesophageal reflux disease)   . Hypertension    Past Surgical History  Procedure Date  . Cholecystectomy   . Knee surgery 1994  . Trauma to aerm in machine  1996    reports that she has never smoked. She does not have any smokeless tobacco history on file. She reports that she does not drink alcohol or use illicit drugs. family history includes Diabetes in her mother and Hypertension in her father. Not on File     Objective:   Physical Exam  Constitutional: She is oriented to person, place, and time. She appears well-developed and well-nourished. No distress.  HENT:  Head: Normocephalic and atraumatic.  Right Ear: External ear normal.  Left Ear: External ear normal.       Patient has swollen turbinates bilaterally consistent with allergic  Eyes: Conjunctivae and EOM are normal. Pupils are equal, round, and reactive to light.  Neck: Normal range of motion. Neck supple.  No JVD present. No tracheal deviation present. No thyromegaly present.  Cardiovascular: Normal rate, regular rhythm, normal heart sounds and intact distal pulses.   No murmur heard. Pulmonary/Chest: Effort normal and breath sounds normal. She has no wheezes. She exhibits no tenderness.  Abdominal: Soft. Bowel sounds are normal.  Musculoskeletal: Normal range of motion. She exhibits no edema and no tenderness.  Lymphadenopathy:    She has no cervical adenopathy.  Neurological: She is alert and oriented to person, place, and time. She has normal reflexes. No cranial nerve deficit.  Skin: Skin is warm and dry. She is not diaphoretic.  Psychiatric: She has a normal mood and affect. Her behavior is normal.          Assessment & Plan:  She has severe allergic rhinitis will give her a shot of Depo-Medrol today as well as place her on Nasonex and recommended she try an over-the-counter medication such as Zyrtec before bedtime.  She is encouraged to continue with the salt water sprays because this will help in her allergies.  Her lung posterior clear today and she has no signs of asthma her heart examination shows a regular rate and rhythm and her blood pressure is well controlled she has no chest pain with exercise and is doing stable from that standpoint.  Of concern is her persistent reflux she'll be placed back on Nexium we'll give her Nexium samples to aid her in compliance follow back in 2 months to make sure  that the symptoms have abated if not she'll be referred back to the gastroenterologist

## 2011-02-13 ENCOUNTER — Ambulatory Visit (INDEPENDENT_AMBULATORY_CARE_PROVIDER_SITE_OTHER): Payer: 59 | Admitting: Internal Medicine

## 2011-02-13 ENCOUNTER — Encounter: Payer: Self-pay | Admitting: Internal Medicine

## 2011-02-13 DIAGNOSIS — R599 Enlarged lymph nodes, unspecified: Secondary | ICD-10-CM

## 2011-02-13 DIAGNOSIS — R591 Generalized enlarged lymph nodes: Secondary | ICD-10-CM | POA: Insufficient documentation

## 2011-02-13 MED ORDER — AMOXICILLIN-POT CLAVULANATE 875-125 MG PO TABS: 1.0000 | ORAL_TABLET | Freq: Two times a day (BID) | ORAL | Status: AC

## 2011-02-13 NOTE — Progress Notes (Signed)
  Subjective:    Patient ID: Holly Arellano, female    DOB: July 02, 1956, 55 y.o.   MRN: [id number]  HPI Pt presents to clinic for evaluation of possible enlarged lymph node. That's two days ago developed paranasal and frontal headache with ear discomfort. No fever chills or drainage from the ears. Has a one-week history of mild sore throat without cough. Also notes recent dental work including a tooth being pulled approximately one week ago. This morning awoke with enlargement of what she believes to be a right anterior lymph node. It is swollen and tender to touch. No exacerbating or alleviating factors. No other complaints.  Reviewed past medical history, medications and allergies.  Review of Systems  Constitutional: Negative for fever and chills.  HENT: Positive for ear pain and congestion. Negative for facial swelling, neck pain and ear discharge.   Respiratory: Negative for cough and wheezing.   Skin: Negative for color change and rash.       Objective:   Physical Exam  Nursing note and vitals reviewed. Constitutional: She appears well-developed and well-nourished. No distress.  HENT:  Head: Normocephalic and atraumatic.  Right Ear: Tympanic membrane, external ear and ear canal normal.  Left Ear: Tympanic membrane, external ear and ear canal normal.  Nose: Nose normal.  Mouth/Throat: Uvula is midline, oropharynx is clear and moist and mucous membranes are normal. No oral lesions. No oropharyngeal exudate, posterior oropharyngeal edema, posterior oropharyngeal erythema or tonsillar abscesses.  Eyes: Conjunctivae are normal. No scleral icterus.  Neck: Neck supple. No JVD present.       Right anterior soft tissue prominence ? lymph node vs. enlarged submandibular gland. Mildly tender to palpation.  Neurological: She is alert.  Skin: Skin is warm and dry. No rash noted. She is not diaphoretic. No erythema.  Psychiatric: She has a normal mood and affect.          Assessment &  Plan:

## 2011-02-13 NOTE — Assessment & Plan Note (Signed)
Recommend beginning antibiotic therapy with Augmentin twice a day x7 days.Followup if no improvement or worsening.

## 2011-02-26 ENCOUNTER — Encounter: Payer: Self-pay | Admitting: Internal Medicine

## 2011-03-28 ENCOUNTER — Ambulatory Visit (INDEPENDENT_AMBULATORY_CARE_PROVIDER_SITE_OTHER): Payer: 59 | Admitting: Family Medicine

## 2011-03-28 ENCOUNTER — Ambulatory Visit (INDEPENDENT_AMBULATORY_CARE_PROVIDER_SITE_OTHER)
Admission: RE | Admit: 2011-03-28 | Discharge: 2011-03-28 | Disposition: A | Discharge status: Home / Self Care | Payer: 59 | Source: Ambulatory Visit | Attending: Family Medicine | Admitting: Family Medicine

## 2011-03-28 ENCOUNTER — Encounter: Payer: Self-pay | Admitting: Family Medicine

## 2011-03-28 DIAGNOSIS — J029 Acute pharyngitis, unspecified: Secondary | ICD-10-CM

## 2011-03-28 DIAGNOSIS — R059 Cough, unspecified: Secondary | ICD-10-CM

## 2011-03-28 DIAGNOSIS — J329 Chronic sinusitis, unspecified: Secondary | ICD-10-CM

## 2011-03-28 DIAGNOSIS — R05 Cough: Secondary | ICD-10-CM

## 2011-03-28 MED ORDER — AMOXICILLIN-POT CLAVULANATE 875-125 MG PO TABS: 1.0000 | ORAL_TABLET | Freq: Two times a day (BID) | ORAL | Status: AC

## 2011-03-28 NOTE — Progress Notes (Signed)
  Subjective:    Patient ID: Holly Arellano, female    DOB: Jul 22, 1956, 55 y.o.   MRN: [id number]  HPI Patient is nonsmoker seen with persistent cough off-and-on for over one month. Recently placed on Augmentin and symptoms seemed improved-briefly. Intermittent symptoms of sore throat and also nasal congestion and bilateral frontal sinus pressure. No history of asthma. Has frequent postnasal drip symptoms and uses Nasonex for that. Also takes Zyrtec intermittently. She has history of reflux which has been controlled with Nexium. Denies any hemoptysis. Rapid strep negative today. Her father is a smoker as that is her only exposure   Review of Systems As per HPI    Objective:   Physical Exam  Constitutional: She is oriented to person, place, and time. She appears well-developed and well-nourished. No distress.  HENT:  Right Ear: External ear normal.  Left Ear: External ear normal.  Mouth/Throat: Oropharynx is clear and moist. No oropharyngeal exudate.  Neck: Neck supple. No thyromegaly present.  Cardiovascular: Normal rate and regular rhythm.   Pulmonary/Chest: No respiratory distress. She has no wheezes. She has no rales.  Musculoskeletal: She exhibits no edema.  Lymphadenopathy:    She has no cervical adenopathy.  Neurological: She is alert and oriented to person, place, and time.          Assessment & Plan:  Persistent cough. Differential includes recurrent versus chronic sinusitis. Augmentin 825 mg twice a day for 14 days. Consider CT sinuses if no better. Chest x-ray ordered given duration of cough. She does have some postnasal drip will continue Nasonex and antihistamines for that. Intermittent sore throat. Rapid strep neg.  ?secondary to postnasal drip.

## 2011-03-28 NOTE — Patient Instructions (Signed)
Be in touch if cough not improved in 2 weeks.

## 2011-03-29 NOTE — Progress Notes (Signed)
Quick Note:  Pt informed ______ 

## 2011-04-08 ENCOUNTER — Telehealth: Payer: Self-pay | Admitting: *Deleted

## 2011-04-08 NOTE — Telephone Encounter (Signed)
Talked with pt and she is feeling  Better - did not go to er as  Suggested- offered ov today,but refused stating she was better- encouraged to call in several days if not better

## 2011-04-08 NOTE — Telephone Encounter (Signed)
[facility] Triage Call Report Triage Record Num: [id number] Operator: Fernand Parkins Patient Name: [facility] Call Date & Time: 04/06/2011 10:04:29AM Patient Phone: [phone] PCP: Darryll Capers Patient Gender: Female PCP Fax : [phone] Patient DOB: 1956/02/04 Practice Name: [facility] Reason for Call: throat and tongue feels funny, kind of swollen; has nervous feeling; chest feels strange THE PATIENT REFUSED 911 Jovee/pt calling regarding tongue and throat feels funny. Pt advises chest feels "nervous" and heart is beating funny like having palpitations. Pt advises feels dizzy with ringing in her head. Pt advises sxs onset x 1 week. Pt advises is being treated for sinus infection. All emergent sxs r/ow ith exception of see provider within 24 hrs d/t previously evaluated and worsening symptoms interferring with ability to carry out activities of daily living - per Dizziness or Vertigo guidelines. Care advice given. Pt verbalized understanding and advised would got to [facility] for evaluation. Protocol(s) Used: Dizziness or Vertigo Recommended Outcome per Protocol: See Provider within 24 hours Reason for Outcome: Previously evaluated and worsening symptoms interfering with ability to carry out activities of daily living (ADLs) Care Advice: Call EMS 911 if patient develops confusion, decreased level of consciousness, chest pain, shortness of breath, or focal neurologic abnormalities such as facial droop or weakness of one extremity. ~ ~ DO NOT drive until condition evaluated. ~ Protect from falling or other injury. ~ Should not be alone, arrange for support (family member, friend, etc.). Avoid caffeine (coffee, tea, cola drinks, or chocolate), alcohol, and nicotine (use of tobacco), as use of these substances may worsen symptoms. ~ ~ Call provider immediately if have difficulty walking, vision problems, or weakness. ~ SYMPTOM / CONDITION MANAGEMENT ~ SAFETY /  PROTECTION ~ CAUTIONS ~ List, or take, all current prescription(s), nonprescription or alternative medication(s) to provider for evaluation. ~ Lie still in a dimly lit room and avoid any sudden change in position. A temporary drop in blood pressure sometimes occurs with a quick change to an upright position (postural hypotension) and may cause light-headedness or dizziness. Change position slowly. Making a habit of rising slowly and sitting for a few minutes before standing to walk usually relieves the feeling of faintness. ~ When feeling faint, find a place to lie down if possible, and elevate legs 8 to 12 inches above the heart. If unable to lie down, sit in a chair and lower head between the knees for 3 to 5 minutes. If standing and not able to sit, cross legs and squeeze the knees together to move blood to the heart. ~ 04/06/2011 10:19:40AM Page 1 of 1 [id number]

## 2011-05-13 ENCOUNTER — Ambulatory Visit: Payer: 59 | Admitting: Internal Medicine

## 2011-06-07 ENCOUNTER — Ambulatory Visit: Payer: 59 | Admitting: Internal Medicine

## 2011-07-05 ENCOUNTER — Ambulatory Visit (INDEPENDENT_AMBULATORY_CARE_PROVIDER_SITE_OTHER): Payer: 59 | Admitting: Internal Medicine

## 2011-07-05 ENCOUNTER — Encounter: Payer: Self-pay | Admitting: Internal Medicine

## 2011-07-05 VITALS — BP 146/80 | HR 76 | Temp 98.6°F | Resp 16 | Ht 66.0 in | Wt 210.0 lb

## 2011-07-05 DIAGNOSIS — I7300 Raynaud's syndrome without gangrene: Secondary | ICD-10-CM

## 2011-07-05 DIAGNOSIS — K219 Gastro-esophageal reflux disease without esophagitis: Secondary | ICD-10-CM

## 2011-07-05 DIAGNOSIS — I10 Essential (primary) hypertension: Secondary | ICD-10-CM

## 2011-07-05 DIAGNOSIS — J309 Allergic rhinitis, unspecified: Secondary | ICD-10-CM

## 2011-07-05 MED ORDER — BISOPROLOL-HYDROCHLOROTHIAZIDE 2.5-6.25 MG PO TABS: 1.0000 | ORAL_TABLET | Freq: Every day | ORAL | Status: DC

## 2011-07-05 NOTE — Patient Instructions (Signed)
Watch salt and loose weight

## 2011-08-22 ENCOUNTER — Encounter: Payer: Self-pay | Admitting: Cardiology

## 2011-08-23 ENCOUNTER — Ambulatory Visit (INDEPENDENT_AMBULATORY_CARE_PROVIDER_SITE_OTHER): Payer: 59 | Admitting: Cardiology

## 2011-08-23 ENCOUNTER — Encounter: Payer: Self-pay | Admitting: Cardiology

## 2011-08-23 VITALS — BP 124/88 | HR 73 | Ht 66.0 in | Wt 203.0 lb

## 2011-08-23 DIAGNOSIS — I10 Essential (primary) hypertension: Secondary | ICD-10-CM

## 2011-08-23 DIAGNOSIS — R079 Chest pain, unspecified: Secondary | ICD-10-CM

## 2011-08-23 NOTE — Patient Instructions (Signed)
Your physician has requested that you have en exercise stress myoview. For further information please visit [email or web address] Please follow instruction sheet, as given.  Your physician recommends that you schedule a follow-up appointment in about 3 weeks with Dr Shirlee Latch.

## 2011-08-25 DIAGNOSIS — R079 Chest pain, unspecified: Secondary | ICD-10-CM | POA: Insufficient documentation

## 2011-08-25 NOTE — Progress Notes (Signed)
PCP: Dr. Lovell Sheehan  55 yo with history of HTN presents for evaluation of chest pain.  Patient felt "bad" at work yesterday.  She had sinus congestion and felt "dizzy" while sitting at her desk.  She additionally had chest pain.  She has actually had chest pain on and off for about 2 months.  She will get achy pain that radiates to her neck and to her left shoulder.  There is no particular trigger.  It is not exertional.  It occurs on most days.  The pain will last for 5-10 minutes.  Often it will occur at work while she is sitting at her desk.  She has been under increased stress at work and at home. Her father has dementia and she has been caring for him.  She does have a history of GERD and has not been taking her Nexium.  No exertional dyspnea.    She was seen at [facility] yesterday because of her congestion and dizziness.  She was started on amoxicillin for sinusitis.  She also told them about her chest pain so was referred to this office.  She says that her BP has been running high on occasion.  It looks ok today.    ECG: NSR, normal  PMH: 1. HTN 2. Allergic rhinitis 3. GERD 4. CCY  FH: Mother with diabetes, COPD, CHF.  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   ROS: All systems reviewed and negative except as per HPI.   Current Outpatient Prescriptions  Medication Sig Dispense Refill  . amoxicillin (AMOXIL) 500 MG capsule As directed      . bisoprolol-hydrochlorothiazide (ZIAC) 2.5-6.25 MG per tablet Take 1 tablet by mouth daily.  30 tablet  11  . fluticasone (FLONASE) 50 MCG/ACT nasal spray As directed      . guaiFENesin (MUCINEX) 600 MG 12 hr tablet Take 1,200 mg by mouth 2 (two) times daily.          BP 124/88  Pulse 73  Ht 5\' 6"  (1.676 m)  Wt 92.08 kg (203 lb)  BMI 32.76 kg/m2 General: NAD, overweight.  Neck: No JVD, no thyromegaly or thyroid nodule.  Lungs: Clear to auscultation bilaterally with normal respiratory effort. CV: Nondisplaced  PMI.  Heart regular S1/S2, no S3/S4, no murmur.  No peripheral edema.  No carotid bruit.  Normal pedal pulses.  Abdomen: Soft, nontender, no hepatosplenomegaly, no distention.  Skin: Intact without lesions or rashes.  Neurologic: Alert and oriented x 3.  Psych: Normal affect. Extremities: No clubbing or cyanosis.  HEENT: Normal.

## 2011-08-25 NOTE — Assessment & Plan Note (Signed)
Atypical chest pain.  Main risk factor is HTN.  She does have a history of GERD and has been off her PPI though she says that the chest pain she has been having is not typical for her GERD.  I will have her restart her Nexium.  I will arrange for an ETT-myoview to assess for ischemia.  She is scared about doing a treadmill (says BP rises a lot when she walks on a treadmill), but we can switch over to [place] if needed.  It is possible that her symptoms are stress-related as well.

## 2011-08-25 NOTE — Assessment & Plan Note (Signed)
She is taking bisoprolol/HCTZ.  BP is ok today but will need to be monitored over time.

## 2011-08-27 ENCOUNTER — Other Ambulatory Visit: Payer: Self-pay | Admitting: *Deleted

## 2011-08-27 ENCOUNTER — Telehealth: Payer: Self-pay | Admitting: *Deleted

## 2011-08-27 NOTE — Progress Notes (Signed)
Addended by: Jacqlyn Krauss on: 08/27/2011 08:30 AM   Modules accepted: Orders

## 2011-08-27 NOTE — Telephone Encounter (Signed)
Thurston Hole,  Can you please canx order for lexiscan and put in order for stress echo?  Merita Norton,  Can you please canx lexiscan and r/s pt for stress echo?  Thanks,  Charmaine ----- Message ----- From: Marca Ancona, MD Sent: 08/26/2011 4:28 PM To: Britt Bolognese Subject: RE: Lexiscan vs Stress Echo   OK to do stress echo.  [place] ----- Message ----- From: Britt Bolognese Sent: 08/26/2011 11:48 AM To: Marca Ancona, MD Subject: Eugenie Birks vs Stress Echo   Dr. Shirlee Latch,  Pt scheduled for ETT myoview 08-29-11. Will need peer review or do you want to schedule as a Lexiscan or stress echo?  Thanks,  Charmaine

## 2011-08-29 ENCOUNTER — Encounter (HOSPITAL_COMMUNITY): Payer: 59 | Admitting: Radiology

## 2011-09-02 ENCOUNTER — Ambulatory Visit (HOSPITAL_COMMUNITY): Payer: 59 | Attending: Cardiology | Admitting: Radiology

## 2011-09-02 DIAGNOSIS — R072 Precordial pain: Secondary | ICD-10-CM

## 2011-09-02 DIAGNOSIS — K219 Gastro-esophageal reflux disease without esophagitis: Secondary | ICD-10-CM | POA: Insufficient documentation

## 2011-09-02 DIAGNOSIS — I7300 Raynaud's syndrome without gangrene: Secondary | ICD-10-CM | POA: Insufficient documentation

## 2011-09-02 DIAGNOSIS — I10 Essential (primary) hypertension: Secondary | ICD-10-CM | POA: Insufficient documentation

## 2011-09-02 DIAGNOSIS — R079 Chest pain, unspecified: Secondary | ICD-10-CM | POA: Insufficient documentation

## 2011-09-06 ENCOUNTER — Ambulatory Visit: Payer: 59 | Admitting: Internal Medicine

## 2011-09-12 ENCOUNTER — Telehealth: Payer: Self-pay | Admitting: Cardiology

## 2011-09-12 ENCOUNTER — Encounter: Payer: Self-pay | Admitting: Cardiology

## 2011-09-12 ENCOUNTER — Ambulatory Visit (INDEPENDENT_AMBULATORY_CARE_PROVIDER_SITE_OTHER): Payer: 59 | Admitting: Cardiology

## 2011-09-12 DIAGNOSIS — I10 Essential (primary) hypertension: Secondary | ICD-10-CM

## 2011-09-12 DIAGNOSIS — R079 Chest pain, unspecified: Secondary | ICD-10-CM

## 2011-09-12 NOTE — Assessment & Plan Note (Signed)
BP excellent.  She needs to exercise more and lose some weight.  With weight loss, I suspect she could come off BP meds.

## 2011-09-12 NOTE — Assessment & Plan Note (Signed)
Atypical, suspect noncardiac.  Stress echo was normal.  No further workup necessary.  Possible musculoskeletal pain due to the way she sits when she types at work.

## 2011-09-12 NOTE — Telephone Encounter (Signed)
Pt has a important meeting today and may be late for her appt and she needs to know if she will be charged $50 if she can't make it.

## 2011-09-12 NOTE — Progress Notes (Signed)
PCP: Dr. Lovell Sheehan  55 yo with history of HTN presented for evaluation of chest pain.  Patient had been having atypical chest pain on and off for about 2 months.  I had her do a stress echo: this was normal with no evidence for ischemia or infarction.  She continues to have the atypical chest pain spells, but less often.  She is trying to lose weight.  She does not get much exercise.    PMH: 1. HTN 2. Allergic rhinitis 3. GERD 4. CCY 5. Atypical chest pain: Stress echo (12/12) no evidence for ischemia or infarction.   FH: Mother with diabetes, COPD, CHF.  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    Current Outpatient Prescriptions  Medication Sig Dispense Refill  . amoxicillin (AMOXIL) 500 MG capsule As directed      . bisoprolol-hydrochlorothiazide (ZIAC) 2.5-6.25 MG per tablet Take 1 tablet by mouth daily.  30 tablet  11  . fluticasone (FLONASE) 50 MCG/ACT nasal spray As directed      . guaiFENesin (MUCINEX) 600 MG 12 hr tablet Take 1,200 mg by mouth 2 (two) times daily.          BP 120/82  Pulse 84  Ht 5\' 6"  (1.676 m)  Wt 91.627 kg (202 lb)  BMI 32.60 kg/m2 General: NAD, overweight.  Neck: No JVD, no thyromegaly or thyroid nodule.  Lungs: Clear to auscultation bilaterally with normal respiratory effort. CV: Nondisplaced PMI.  Heart regular S1/S2, no S3/S4, no murmur.  No peripheral edema.  No carotid bruit.  Normal pedal pulses.  Abdomen: Soft, nontender, no hepatosplenomegaly, no distention.  Neurologic: Alert and oriented x 3.  Psych: Normal affect. Extremities: No clubbing or cyanosis.

## 2011-09-12 NOTE — Patient Instructions (Signed)
Your physician recommends that you schedule a follow-up appointment as needed with Dr McLean.  

## 2011-09-12 NOTE — Telephone Encounter (Signed)
Pt may be a few minutes late for 4pm appt. Dr Shirlee Latch will not charge a late fee. Discussed with pt

## 2011-09-27 ENCOUNTER — Encounter: Payer: Self-pay | Admitting: Internal Medicine

## 2011-09-27 ENCOUNTER — Ambulatory Visit (INDEPENDENT_AMBULATORY_CARE_PROVIDER_SITE_OTHER): Payer: 59 | Admitting: Internal Medicine

## 2011-09-27 VITALS — BP 146/100 | HR 76 | Temp 98.2°F | Resp 16 | Ht 66.0 in | Wt 202.0 lb

## 2011-09-27 DIAGNOSIS — I10 Essential (primary) hypertension: Secondary | ICD-10-CM

## 2011-09-27 DIAGNOSIS — J3089 Other allergic rhinitis: Secondary | ICD-10-CM

## 2011-09-27 MED ORDER — FLUTICASONE PROPIONATE 50 MCG/ACT NA SUSP: 2.0000 | Freq: Every day | NASAL | Status: DC

## 2011-09-27 MED ORDER — BISOPROLOL-HYDROCHLOROTHIAZIDE 2.5-6.25 MG PO TABS: 1.0000 | ORAL_TABLET | Freq: Every day | ORAL | Status: DC

## 2011-09-27 NOTE — Progress Notes (Signed)
Subjective:    Patient ID: Holly Arellano, female    DOB: 1956-05-08, 55 y.o.   MRN: [id number]  HPI Patient is a 56 year old African American female who is followed for new onset hypertension at the last office visit we started her on z-axis but do to some confusion about the links of her therapy she discontinued it after 2 weeks.  She was then seen by cardiology after an episode of chest pain and evaluated by Dr. Shirlee Latch. This was a referral from an urgent care she was seen for sinus infection and was noted to have an elevated blood pressure and some chest discomfort.  She had EKG urgent medical and was sent to cardiology cardiology evaluated her with a stress test which was read as normal but he did note that she had a hypertensive response and wanted her to stay on the beta blocker but unfortunately there was some confusion and she has not been on medications  she has chronic allergic rhinnitis  Review of Systems  Constitutional: Negative for activity change, appetite change and fatigue.  HENT: Negative for ear pain, congestion, neck pain, postnasal drip and sinus pressure.   Eyes: Negative for redness and visual disturbance.  Respiratory: Negative for cough, shortness of breath and wheezing.   Gastrointestinal: Negative for abdominal pain and abdominal distention.  Genitourinary: Negative for dysuria, frequency and menstrual problem.  Musculoskeletal: Negative for myalgias, joint swelling and arthralgias.  Skin: Negative for rash and wound.  Neurological: Negative for dizziness, weakness and headaches.  Hematological: Negative for adenopathy. Does not bruise/bleed easily.  Psychiatric/Behavioral: Negative for sleep disturbance and decreased concentration.   Past Medical History  Diagnosis Date  . GERD (gastroesophageal reflux disease)   . Hypertension     History   Social History  . Marital Status: Married    Spouse Name: N/A    Number of Children: N/A  . Years of  Education: N/A   Occupational History  . Not on file.   Social History Main Topics  . Smoking status: Never Smoker   . Smokeless tobacco: Not on file  . Alcohol Use: No  . Drug Use: No  . Sexually Active: Not on file   Other Topics Concern  . Not on file   Social History Narrative  . No narrative on file    Past Surgical History  Procedure Date  . Cholecystectomy   . Knee surgery 1994  . Trauma to aerm in machine  1996    Family History  Problem Relation Age of Onset  . Diabetes Mother   . Hypertension Father     No Known Allergies  Current Outpatient Prescriptions on File Prior to Visit  Medication Sig Dispense Refill  . fluticasone (FLONASE) 50 MCG/ACT nasal spray As directed      . bisoprolol-hydrochlorothiazide (ZIAC) 2.5-6.25 MG per tablet Take 1 tablet by mouth daily.  30 tablet  11    BP 146/100  Pulse 76  Temp 98.2 F (36.8 C)  Resp 16  Ht 5\' 6"  (1.676 m)  Wt 202 lb (91.627 kg)  BMI 32.60 kg/m2       Objective:   Physical Exam  Nursing note and vitals reviewed. Constitutional: She is oriented to person, place, and time. She appears well-developed and well-nourished.  HENT:  Head: Normocephalic and atraumatic.  Eyes: Conjunctivae and EOM are normal. Pupils are equal, round, and reactive to light.  Neck: Normal range of motion. Neck supple.  Pulmonary/Chest: Effort normal and breath sounds normal.  Abdominal: Soft. Bowel sounds are normal.  Musculoskeletal: Normal range of motion.  Neurological: She is alert and oriented to person, place, and time.  Skin: Skin is warm and dry.          Assessment & Plan:  Chest severe persistent allergic rhinitis we recommend that she continue to use the Flonase as well as the salt water lavage of her sinuses.  She will also be compliant with the ziac for hypertension and take that on a daily basis.

## 2011-09-27 NOTE — Patient Instructions (Signed)
The patient is instructed to continue all medications as prescribed. Schedule followup with check out clerk upon leaving the clinic  

## 2011-12-03 ENCOUNTER — Encounter: Payer: Self-pay | Admitting: Internal Medicine

## 2011-12-27 ENCOUNTER — Encounter: Payer: Self-pay | Admitting: Internal Medicine

## 2011-12-27 ENCOUNTER — Ambulatory Visit (INDEPENDENT_AMBULATORY_CARE_PROVIDER_SITE_OTHER): Payer: 59 | Admitting: Internal Medicine

## 2011-12-27 VITALS — BP 140/82 | HR 76 | Temp 98.3°F | Resp 16 | Ht 66.0 in | Wt 198.0 lb

## 2011-12-27 DIAGNOSIS — E785 Hyperlipidemia, unspecified: Secondary | ICD-10-CM

## 2011-12-27 DIAGNOSIS — J309 Allergic rhinitis, unspecified: Secondary | ICD-10-CM

## 2011-12-27 DIAGNOSIS — I10 Essential (primary) hypertension: Secondary | ICD-10-CM

## 2011-12-27 DIAGNOSIS — D649 Anemia, unspecified: Secondary | ICD-10-CM

## 2011-12-27 DIAGNOSIS — T887XXA Unspecified adverse effect of drug or medicament, initial encounter: Secondary | ICD-10-CM

## 2011-12-27 DIAGNOSIS — J310 Chronic rhinitis: Secondary | ICD-10-CM

## 2011-12-27 MED ORDER — MONTELUKAST SODIUM 10 MG PO TABS: 10.0000 mg | ORAL_TABLET | Freq: Every day | ORAL | Status: DC

## 2011-12-27 NOTE — Patient Instructions (Addendum)
The patient is instructed to continue all medications as prescribed. Schedule followup with check out clerk upon leaving the clinic  

## 2011-12-27 NOTE — Progress Notes (Signed)
Subjective:    Patient ID: Holly Arellano, female    DOB: 07-22-1956, 56 y.o.   MRN: [id number]  HPI The patient is a 56 year old African American female who presents for followup of hypertension allergies and chronic vasomotor rhinitis.  Also history of cough variant asthma when she coughs in the fall and winter seasons where she is most allergic discomfort.  She is also due screening for diabetes for her kidney and potassium function on a diuretic as well as a CBC with a history of prior anemia   Review of Systems  Constitutional: Negative for activity change, appetite change and fatigue.  HENT: Positive for congestion, rhinorrhea and tinnitus. Negative for ear pain, neck pain, postnasal drip and sinus pressure.   Eyes: Negative for redness and visual disturbance.  Respiratory: Negative for cough, shortness of breath and wheezing.   Gastrointestinal: Negative for abdominal pain and abdominal distention.  Genitourinary: Negative for dysuria, frequency and menstrual problem.  Musculoskeletal: Negative for myalgias, joint swelling and arthralgias.  Skin: Negative for rash and wound.  Neurological: Negative for dizziness, weakness and headaches.  Hematological: Negative for adenopathy. Does not bruise/bleed easily.  Psychiatric/Behavioral: Negative for sleep disturbance and decreased concentration.   Past Medical History  Diagnosis Date  . GERD (gastroesophageal reflux disease)   . Hypertension     History   Social History  . Marital Status: Married    Spouse Name: N/A    Number of Children: N/A  . Years of Education: N/A   Occupational History  . Not on file.   Social History Main Topics  . Smoking status: Never Smoker   . Smokeless tobacco: Not on file  . Alcohol Use: No  . Drug Use: No  . Sexually Active: Not on file   Other Topics Concern  . Not on file   Social History Narrative  . No narrative on file    Past Surgical History  Procedure Date  .  Cholecystectomy   . Knee surgery 1994  . Trauma to aerm in machine  1996    Family History  Problem Relation Age of Onset  . Diabetes Mother   . Hypertension Father     No Known Allergies  Current Outpatient Prescriptions on File Prior to Visit  Medication Sig Dispense Refill  . bisoprolol-hydrochlorothiazide (ZIAC) 2.5-6.25 MG per tablet Take 1 tablet by mouth daily.  30 tablet  11  . fluticasone (FLONASE) 50 MCG/ACT nasal spray Place 2 sprays into the nose daily. As directed  16 g  11  . montelukast (SINGULAIR) 10 MG tablet Take 1 tablet (10 mg total) by mouth at bedtime.  30 tablet  11    BP 140/82  Pulse 76  Temp 98.3 F (36.8 C)  Resp 16  Ht 5\' 6"  (1.676 m)  Wt 198 lb (89.812 kg)  BMI 31.96 kg/m2       Objective:   Physical Exam  Nursing note and vitals reviewed. Constitutional: She is oriented to person, place, and time. She appears well-developed and well-nourished. No distress.  HENT:  Head: Normocephalic and atraumatic.  Right Ear: External ear normal.  Left Ear: External ear normal.  Nose: Nose normal.  Mouth/Throat: Oropharynx is clear and moist.  Eyes: Conjunctivae and EOM are normal. Pupils are equal, round, and reactive to light.  Neck: Normal range of motion. Neck supple. No JVD present. No tracheal deviation present. No thyromegaly present.  Cardiovascular: Normal rate, regular rhythm, normal heart sounds and intact distal pulses.  No murmur heard. Pulmonary/Chest: Effort normal and breath sounds normal. She has no wheezes. She exhibits no tenderness.  Abdominal: Soft. Bowel sounds are normal.  Musculoskeletal: Normal range of motion. She exhibits no edema and no tenderness.  Lymphadenopathy:    She has no cervical adenopathy.  Neurological: She is alert and oriented to person, place, and time. She has normal reflexes. No cranial nerve deficit.  Skin: Skin is warm and dry. She is not diaphoretic.  Psychiatric: She has a normal mood and affect. Her  behavior is normal.          Assessment & Plan:  Patient is a 56 year old female with hypertension stable on medication.  We'll check a basic metabolic panel to [place]. monitor renal function and potassium on a diuretic beta blocker.  She is worsening allergic rhinitis and vasomotor rhinitis she's been off the Singulair we will resume the Singulair 10 mg by mouth daily and encourage her to take her Flonase as scheduled.  We also suggested that she could take the Zyrtec at bedtime.  Otherwise she is stable but in need of some screening labs his history of anemia will check CBC differential has a history of hyperlipidemia we'll do a lipid panel

## 2011-12-28 LAB — HEPATIC FUNCTION PANEL
ALT: 13 U/L (ref 0–35)
AST: 13 U/L (ref 0–37)
Albumin: 4.3 g/dL (ref 3.5–5.2)
Alkaline Phosphatase: 68 U/L (ref 39–117)
Total Protein: 6.3 g/dL (ref 6.0–8.3)

## 2011-12-28 LAB — CBC WITH DIFFERENTIAL/PLATELET
Basophils Relative: 0 % (ref 0–1)
Eosinophils Absolute: 0.1 10*3/uL (ref 0.0–0.7)
MCH: 28.8 pg (ref 26.0–34.0)
MCHC: 34.3 g/dL (ref 30.0–36.0)
Monocytes Relative: 6 % (ref 3–12)
Neutrophils Relative %: 52 % (ref 43–77)
Platelets: 241 10*3/uL (ref 150–400)

## 2011-12-28 LAB — BASIC METABOLIC PANEL
CO2: 26 mEq/L (ref 19–32)
Chloride: 109 mEq/L (ref 96–112)
Glucose, Bld: 94 mg/dL (ref 70–99)
Potassium: 4.3 mEq/L (ref 3.5–5.3)
Sodium: 143 mEq/L (ref 135–145)

## 2011-12-28 LAB — TSH: TSH: 1.43 u[IU]/mL (ref 0.350–4.500)

## 2012-04-28 ENCOUNTER — Ambulatory Visit (INDEPENDENT_AMBULATORY_CARE_PROVIDER_SITE_OTHER): Payer: 59 | Admitting: Internal Medicine

## 2012-04-28 ENCOUNTER — Encounter: Payer: Self-pay | Admitting: Internal Medicine

## 2012-04-28 VITALS — BP 134/72 | HR 72 | Temp 98.2°F | Resp 16 | Ht 66.0 in | Wt 200.0 lb

## 2012-04-28 DIAGNOSIS — J309 Allergic rhinitis, unspecified: Secondary | ICD-10-CM

## 2012-04-28 DIAGNOSIS — J019 Acute sinusitis, unspecified: Secondary | ICD-10-CM

## 2012-04-28 DIAGNOSIS — E785 Hyperlipidemia, unspecified: Secondary | ICD-10-CM

## 2012-04-28 DIAGNOSIS — I10 Essential (primary) hypertension: Secondary | ICD-10-CM

## 2012-04-28 MED ORDER — AZITHROMYCIN 250 MG PO TABS: ORAL_TABLET | ORAL | Status: AC

## 2012-04-28 MED ORDER — OMEGA-3 FATTY ACIDS 1000 MG PO CAPS: 2.0000 g | ORAL_CAPSULE | Freq: Every day | ORAL | Status: AC

## 2012-04-28 NOTE — Progress Notes (Signed)
  Subjective:    Patient ID: Holly Arellano, female    DOB: 1956/07/12, 56 y.o.   MRN: [id number]  HPI  Patient is a 56 year old female who is followed for hypertension a history of gastroesophageal reflux and screening blood work that was done in March revealed mild elevation of triglycerides and total cholesterol.  Episodic GERD symptoms relieved with gasvicon  Her LDL was 130 and HDL was approximately 36.  Today she has an acute complaint of sinus congestion a short-term duration, mild headache but no fever or chills  Review of Systems  Constitutional: Negative for activity change, appetite change and fatigue.  HENT: Negative for ear pain, congestion, neck pain, postnasal drip and sinus pressure.   Eyes: Negative for redness and visual disturbance.  Respiratory: Negative for cough, shortness of breath and wheezing.   Gastrointestinal: Negative for abdominal pain and abdominal distention.  Genitourinary: Negative for dysuria, frequency and menstrual problem.  Musculoskeletal: Negative for myalgias, joint swelling and arthralgias.  Skin: Negative for rash and wound.  Neurological: Negative for dizziness, weakness and headaches.  Hematological: Negative for adenopathy. Does not bruise/bleed easily.  Psychiatric/Behavioral: Negative for disturbed wake/sleep cycle and decreased concentration.       Objective:   Physical Exam  Nursing note and vitals reviewed. Constitutional: She is oriented to person, place, and time. She appears well-developed and well-nourished. No distress.  HENT:  Head: Normocephalic and atraumatic.       Nasal conjestion  Eyes: Conjunctivae and EOM are normal. Pupils are equal, round, and reactive to light.  Neck: Normal range of motion. Neck supple. No JVD present. No tracheal deviation present. No thyromegaly present.  Cardiovascular: Normal rate, regular rhythm, normal heart sounds and intact distal pulses.   No murmur heard. Pulmonary/Chest: Effort  normal and breath sounds normal. She has no wheezes. She exhibits no tenderness.  Abdominal: Soft. Bowel sounds are normal.  Musculoskeletal: Normal range of motion. She exhibits no edema and no tenderness.  Lymphadenopathy:    She has no cervical adenopathy.  Neurological: She is alert and oriented to person, place, and time. She has normal reflexes. No cranial nerve deficit.  Skin: Skin is warm and dry. She is not diaphoretic.  Psychiatric: She has a normal mood and affect. Her behavior is normal.          Assessment & Plan:  Mild to moderate elevation of cholesterol I recommend that you take an omega-3 or fish oill supplement twice a day  Stable  Blood pressure   Allergic rhinnitis  Acute sinusitis

## 2012-04-28 NOTE — Patient Instructions (Addendum)
Recommend Gaviscon for episodic GERD. If symptoms increase in frequency notify office

## 2012-04-29 LAB — ALLERGEN FOOD PROFILE SPECIFIC IGE
Apple: <0.1 kU/L
Chicken IgE: <0.1 kU/L
Egg White IgE: <0.1 kU/L
Milk IgE: <0.1 kU/L
Orange: <0.1 kU/L
Peanut IgE: <0.1 kU/L
Shrimp IgE: <0.1 kU/L
Tuna IgE: <0.1 kU/L

## 2012-04-29 LAB — ~~LOC~~ ALLERGY PANEL
Allergen, Cedar tree, t12: <0.1 kU/L
Allergen, Comm Silver Birch, t9: <0.1 kU/L
Allergen, D pternoyssinus,d7: <0.1 kU/L
Allergen, Mulberry, t76: <0.1 kU/l
Alternaria Alternata: <0.1 kU/L
Aspergillus fumigatus, IgG: <0.1 kU/L
Bahia Grass: <0.1 kU/L
Bermuda Grass: <0.1 kU/L
Box Elder IgE: <0.1 kU/L
Cat Dander: <0.1 kU/L
Cladosporium Herbarum: <0.1 kU/L
Cockroach: 0.11 kU/L — ABNORMAL HIGH
Common Ragweed: <0.1 kU/L
D. farinae: <0.1 kU/L
Dog Dander: <0.1 kU/L
Elm IgE: <0.1 kU/L
Johnson Grass: <0.1 kU/L
Mucor Racemosus: <0.1 kU/L
Mugwort: <0.1 kU/L
Nettle: <0.1 kU/L
Oak: <0.1 kU/L
Pecan/Hickory Tree IgE: <0.1 kU/L
Penicillium Notatum: <0.1 kU/L
Plantain: <0.1 kU/L
Rough Pigweed  IgE: <0.1 kU/L
Sheep Sorrel IgE: <0.1 kU/L
Stemphylium Botryosum: <0.1 kU/L
Sweet Gum: <0.1 kU/L
Timothy Grass: <0.1 kU/L

## 2012-06-03 ENCOUNTER — Telehealth: Payer: Self-pay | Admitting: Internal Medicine

## 2012-06-03 NOTE — Telephone Encounter (Signed)
Pt informed- all normal except slight allergy to cockroaches

## 2012-06-03 NOTE — Telephone Encounter (Signed)
Caller: Holly Arellano/Patient; Phone: [phone]; Reason for Call: Please call pt with results of allergy testing.

## 2012-07-03 NOTE — Progress Notes (Signed)
  Subjective:    Patient ID: Holly Arellano, female    DOB: 10-18-1955, 56 y.o.   MRN: [id number]  HPI    Review of Systems     Objective:   Physical Exam        Assessment & Plan:  No charge visit for blood pressure check

## 2012-08-21 ENCOUNTER — Ambulatory Visit (INDEPENDENT_AMBULATORY_CARE_PROVIDER_SITE_OTHER): Payer: 59 | Admitting: Family Medicine

## 2012-08-21 ENCOUNTER — Encounter: Payer: Self-pay | Admitting: Family Medicine

## 2012-08-21 ENCOUNTER — Telehealth: Payer: Self-pay | Admitting: Internal Medicine

## 2012-08-21 VITALS — BP 140/100 | HR 107 | Temp 97.3°F | Wt 211.0 lb

## 2012-08-21 DIAGNOSIS — J069 Acute upper respiratory infection, unspecified: Secondary | ICD-10-CM

## 2012-08-21 MED ORDER — HYDROCODONE-HOMATROPINE 5-1.5 MG/5ML PO SYRP: 5.0000 mL | ORAL_SOLUTION | Freq: Four times a day (QID) | ORAL | Status: AC | PRN

## 2012-08-21 NOTE — Telephone Encounter (Signed)
Patient Information:  Caller Name: [facility]  Phone: [phone]  Patient: Holly Arellano, Holly Arellano  Gender: Female  DOB: 02/05/1956  Age: 56 Years  PCP: Darryll Capers (Adults only)   Symptoms  Reason For Call & Symptoms: Sinus congestion/ Headache, sore throat, dry cough  Reviewed Health History In EMR: Yes  Reviewed Medications In EMR: Yes  Reviewed Allergies In EMR: Yes  Reviewed Surgeries / Procedures: Yes  Date of Onset of Symptoms: 08/07/2012  Treatments Tried: Advil Sinus Cold and Benadryl  Treatments Tried Worked: No  Guideline(s) Used:  Colds  Disposition Per Guideline:   See Today or Tomorrow in Office  Reason For Disposition Reached:   Sinus congestion (pressure, fullness) present > 10 days  Advice Given:  For a Stuffy Nose - Use Nasal Washes:  Introduction  How it Helps:  Methods  Humidifier:  If the air in your home is dry, use a cool-mist humidifier  Call Back If:  Difficulty breathing occurs  You become worse  Office Follow Up:  Does the office need to follow up with this patient?: No  Instructions For The Office: N/A  Appointment Scheduled:  08/21/2012 10:45:00  RN Note:  Appointment scheduled 08/21/12 at 10:45 for evaluation

## 2012-08-21 NOTE — Progress Notes (Signed)
  Subjective:    Patient ID: Holly Arellano, female    DOB: [birth year], 56 y.o.   MRN: [id number]  HPI  Acute visit. Patient seen with one to two-week history of some sinus congestion and cough which is mostly nonproductive. She's had some mild sore throat past couple days. No fever. No chills. Took Benadryl and Advil cold and sinus without much improvement of cough. Cough has been most bothersome especially at night. No wheezing. Nonsmoker.  Patient relates some intermittent dizziness off and on for several months. She had workup last year with stress echo which was unremarkable. Never had syncope. Symptoms are not positional. No headaches. No focal neurologic symptoms.  Review of Systems  Constitutional: Negative for fever and chills.  HENT: Positive for congestion and sore throat.   Respiratory: Positive for cough.   Cardiovascular: Negative for chest pain.  Neurological: Positive for dizziness.       Objective:   Physical Exam  Constitutional: She is oriented to person, place, and time. She appears well-developed and well-nourished.  HENT:  Right Ear: External ear normal.  Left Ear: External ear normal.  Mouth/Throat: Oropharynx is clear and moist.  Neck: Neck supple.  Cardiovascular: Normal rate and regular rhythm.   Pulmonary/Chest: Effort normal and breath sounds normal. No respiratory distress. She has no wheezes. She has no rales.  Lymphadenopathy:    She has no cervical adenopathy.  Neurological: She is alert and oriented to person, place, and time. No cranial nerve deficit.          Assessment & Plan:  Viral URI. Hycodan cough syrup for nighttime use as needed. She will continue with over-the-counter medications otherwise. Followup promptly for any fever.

## 2012-08-21 NOTE — Patient Instructions (Addendum)
Follow up for any fever or worsening symptoms. 

## 2012-08-28 ENCOUNTER — Encounter: Payer: Self-pay | Admitting: Internal Medicine

## 2012-08-28 ENCOUNTER — Ambulatory Visit (INDEPENDENT_AMBULATORY_CARE_PROVIDER_SITE_OTHER): Payer: 59 | Admitting: Internal Medicine

## 2012-08-28 VITALS — BP 140/90 | HR 76 | Temp 98.3°F | Resp 16 | Ht 66.0 in | Wt 212.0 lb

## 2012-08-28 DIAGNOSIS — I10 Essential (primary) hypertension: Secondary | ICD-10-CM

## 2012-08-28 DIAGNOSIS — J309 Allergic rhinitis, unspecified: Secondary | ICD-10-CM

## 2012-08-28 MED ORDER — BISOPROLOL-HYDROCHLOROTHIAZIDE 2.5-6.25 MG PO TABS: 1.0000 | ORAL_TABLET | Freq: Every day | ORAL | Status: AC

## 2012-08-28 MED ORDER — MOMETASONE FUROATE 50 MCG/ACT NA SUSP: 2.0000 | Freq: Every day | NASAL | Status: DC

## 2012-08-28 NOTE — Progress Notes (Signed)
Subjective:    Patient ID: Holly Arellano, female    DOB: [birth year], 56 y.o.   MRN: [id number]  HPI Blood pressure  Not stable and compliance is the issue Weight increased  Back to baseline Had a viral infection Hx of chronic sinus infections   Review of Systems  Constitutional: Positive for fatigue. Negative for activity change and appetite change.  HENT: Positive for congestion, rhinorrhea and postnasal drip. Negative for ear pain, neck pain and sinus pressure.   Eyes: Negative for redness and visual disturbance.  Respiratory: Negative for cough, shortness of breath and wheezing.   Gastrointestinal: Negative for abdominal pain and abdominal distention.  Genitourinary: Negative for dysuria, frequency and menstrual problem.  Musculoskeletal: Negative for myalgias, joint swelling and arthralgias.  Skin: Negative for rash and wound.  Neurological: Positive for weakness. Negative for dizziness and headaches.  Hematological: Negative for adenopathy. Does not bruise/bleed easily.  Psychiatric/Behavioral: Negative for sleep disturbance and decreased concentration.   Past Medical History  Diagnosis Date  . GERD (gastroesophageal reflux disease)   . Hypertension     History   Social History  . Marital Status: Married    Spouse Name: N/A    Number of Children: N/A  . Years of Education: N/A   Occupational History  . Not on file.   Social History Main Topics  . Smoking status: Never Smoker   . Smokeless tobacco: Not on file  . Alcohol Use: No  . Drug Use: No  . Sexually Active: Not on file   Other Topics Concern  . Not on file   Social History Narrative  . No narrative on file    Past Surgical History  Procedure Date  . Cholecystectomy   . Knee surgery 1994  . Trauma to aerm in machine  1996    Family History  Problem Relation Age of Onset  . Diabetes Mother   . Hypertension Father     No Known Allergies  Current Outpatient Prescriptions on File Prior to  Visit  Medication Sig Dispense Refill  . fish oil-omega-3 fatty acids 1000 MG capsule Take 2 capsules (2 g total) by mouth daily.      Marland Kitchen HYDROcodone-homatropine (HYCODAN) 5-1.5 MG/5ML syrup Take 5 mLs by mouth every 6 (six) hours as needed for cough.  120 mL  0  . montelukast (SINGULAIR) 10 MG tablet Take 1 tablet (10 mg total) by mouth at bedtime.  30 tablet  11  . mometasone (NASONEX) 50 MCG/ACT nasal spray Place 2 sprays into the nose daily.  17 g  12    BP 140/90  Pulse 76  Temp 98.3 F (36.8 C)  Resp 16  Ht 5\' 6"  (1.676 m)  Wt 212 lb (96.163 kg)  BMI 34.22 kg/m2       Objective:   Physical Exam  Nursing note and vitals reviewed. Constitutional: She is oriented to person, place, and time. She appears well-developed and well-nourished. No distress.  HENT:  Head: Normocephalic and atraumatic.  Nose: Nose normal.       Swollen  TMS  Eyes: Conjunctivae normal and EOM are normal. Pupils are equal, round, and reactive to light.       Purple turbinates  Neck: Normal range of motion. Neck supple. No JVD present. No tracheal deviation present. No thyromegaly present.  Cardiovascular: Normal rate, regular rhythm, normal heart sounds and intact distal pulses.   No murmur heard. Pulmonary/Chest: Effort normal and breath sounds normal. She has no wheezes. She exhibits  no tenderness.  Abdominal: Soft. Bowel sounds are normal.  Musculoskeletal: Normal range of motion. She exhibits no edema and no tenderness.  Lymphadenopathy:    She has no cervical adenopathy.  Neurological: She is alert and oriented to person, place, and time. She has normal reflexes. No cranial nerve deficit.  Skin: Skin is warm and dry. She is not diaphoretic.  Psychiatric: She has a normal mood and affect. Her behavior is normal.          Assessment & Plan:  She has had some symptoms manic elevation of her blood pressure she admits that she is going 30 days without taking her blood pressure  medications.  Because of her age and risk factors she is at high risk for an event of cardiovascular and cerebrovascular we discussed these things and urged compliance with her medications.  She has chronic allergic rhinitis and had a recent viral upper respiratory tract infection flare but her nasal passages are erythematous and purple in color and swollen creating eustachian tube dysfunction and postnasal drip we'll start her on Nasonex 2 sprays each nostril twice daily

## 2012-08-28 NOTE — Patient Instructions (Addendum)
Take your blood pressure medicine every day or you are at risk because of your age and blood pressure of a stroke Only way you can reduce or eliminate your blood pressure medicine is to lose around 25 pounds Use the nasonex twice a day

## 2012-11-27 ENCOUNTER — Ambulatory Visit: Payer: 59 | Admitting: Internal Medicine

## 2013-01-15 ENCOUNTER — Ambulatory Visit (INDEPENDENT_AMBULATORY_CARE_PROVIDER_SITE_OTHER): Payer: 59 | Admitting: Internal Medicine

## 2013-01-15 ENCOUNTER — Encounter: Payer: Self-pay | Admitting: Internal Medicine

## 2013-01-15 VITALS — BP 150/80 | HR 80 | Temp 98.3°F | Resp 16 | Ht 66.0 in | Wt 208.0 lb

## 2013-01-15 DIAGNOSIS — R7309 Other abnormal glucose: Secondary | ICD-10-CM

## 2013-01-15 DIAGNOSIS — R5381 Other malaise: Secondary | ICD-10-CM

## 2013-01-15 DIAGNOSIS — I10 Essential (primary) hypertension: Secondary | ICD-10-CM

## 2013-01-15 DIAGNOSIS — R7303 Prediabetes: Secondary | ICD-10-CM

## 2013-01-15 LAB — BASIC METABOLIC PANEL
BUN: 17 mg/dL (ref 6–23)
Creatinine, Ser: 1 mg/dL (ref 0.4–1.2)
GFR: 77.12 mL/min (ref >60.00)
Glucose, Bld: 93 mg/dL (ref 70–99)

## 2013-01-15 LAB — TSH: TSH: 0.77 u[IU]/mL (ref 0.35–5.50)

## 2013-01-15 NOTE — Patient Instructions (Signed)
How to Avoid Diabetes Problems You can do a lot to prevent or slow down diabetes problems. Following your diabetes plan and taking care of yourself can reduce your risk of serious or life-threatening complications. Below, you will find certain things you can do to prevent diabetes problems. MANAGE YOUR DIABETES Follow your caregiver's, nurse educator's, and dietician's instructions for managing your diabetes. They will teach you the basics of diabetes care. They can help answer questions you may have. Learn about diabetes and make healthy choices regarding eating and physical activity. Monitor your blood glucose level regularly. Your caregiver will help you decide how often to check your blood glucose level depending on your treatment goals and how well you are meeting them.  DO NOT SMOKE Smoking and diabetes are a dangerous combination. Smoking raises your risk for diabetes problems. If you quit smoking, you will lower your risk for heart attack, stroke, nerve disease, and kidney disease. Your cholesterol and your blood pressure levels may improve. Your blood circulation will also improve. If you smoke, ask your caregiver for help in quitting. KEEP YOUR BLOOD PRESSURE UNDER CONTROL Keeping your blood pressure under control will help prevent damage to your eyes, kidneys, heart, and blood vessels. Blood pressure consists of 2 numbers. The top number should be below 130, and the bottom number should be below 80 (130/80). Keep your blood pressure as close to these numbers as you can. If you already have kidney disease, you may want even lower blood pressure to protect your kidneys. Talk to your caregiver to make sure that your blood pressure goal is right for your needs. Meal planning, medicines, and exercise can help you reach your blood pressure target. Have your blood pressure checked at every visit with your caregiver. KEEP YOUR CHOLESTEROL UNDER CONTROL Normal cholesterol levels will help prevent heart  disease and stroke. These are the biggest health problems for people with diabetes. Keeping cholesterol levels under control can also help with blood flow. Have your cholesterol level checked at least once a year. Meal planning, exercise, and medicines can help you reach your cholesterol targets. SCHEDULE AND KEEP YOUR ANNUAL PHYSICAL EXAMS AND EYE EXAMS Your caregiver will tell you how often he or she wants to see you depending on your plan of treatment. It is important that you keep these appointments so that possible problems can be identified early and complications can be avoided or treated.  Every visit with your caregiver should include your weight, blood pressure, and an evaluation of your blood glucose control.  Your hemoglobin A1c should be checked:  At least twice a year if you are at your goal.  Every 3 months if there are changes in treatment.  If you are not meeting your goals.  Your blood lipids should be checked yearly. You should also be checked yearly to see if you have protein in your urine (microalbumin).  Schedule a dilated eye exam if you have type 1 diabetes within 5 years of your diagnosis and then yearly. Schedule a dilated eye exam if you have type 2 diabetes at diagnosis and then yearly. All exams thereafter can be extended to every 2 to 3 years if one or more exams have been normal. KEEP YOUR VACCINES CURRENT The flu vaccine is recommended yearly. The formula for the vaccine changes every year and needs to be updated for the best protection against current viruses. In addition, you should get a vaccination against pneumonia at least once in your life. However, there are some  instances where another vaccine is recommended. Check with your caregiver. TAKE CARE OF YOUR FEET  Diabetes may cause you to have a poor blood supply (circulation) to your legs and feet. Because of this, the skin may be thinner, break easier, and heal more slowly. You also may have nerve damage in  your legs and feet causing decreased feeling. You may not notice minor injuries to your feet that could lead to serious problems or infections. Taking care of your feet is very important. Visual foot exams are performed at every routine medical visit. The exams check for cuts, injuries, or other problems with the feet. A comprehensive foot exam should be done yearly. This includes visual inspection as well as assessing foot pulses and testing for loss of sensation. You should also do the following:  Inspect your feet daily for cuts, calluses, blisters, ingrown toenails, and signs of infection, such as redness, swelling, or pus.  Wash and dry your feet thoroughly, especially between the toes.  Avoid soaking your feet regularly in hot water baths.  Moisturize dry skin with lotion, avoiding areas between your toes.  Cut toenails straight across and file the edges.  Avoid shoes that do not fit well or have areas that irritate your skin.  Avoid going barefooted or wearing only socks. Your feet need protection. TAKE CARE OF YOUR TEETH People with poorly controlled diabetes are more likely to have gum (periodontal) disease. These infections make diabetes harder to control. Periodontal diseases, if left untreated, can lead to tooth loss. Brush your teeth twice a day, floss, and see your dentist for checkups and cleaning every 6 months, or 2 times a year. ASK YOUR CAREGIVER ABOUT TAKING ASPIRIN Taking aspirin daily is recommended to help prevent cardiovascular disease in people with and without diabetes. Ask your caregiver if this would benefit you and what dose he or she would recommend. DRINK RESPONSIBLY Moderate amounts of alcohol (less than 1 drink per day for adult women and less than 2 drinks per day for adult men) have a minimal effect on blood glucose if ingested with food. It is important to eat food with alcohol to avoid hypoglycemia. People should avoid alcohol if they have a history of  alcohol abuse or dependence, if they are pregnant, and if they have liver disease, pancreatitis, advanced neuropathy, or severe hypertriglyceridemia. LESSEN STRESS Living with diabetes can be stressful. When you are under stress, your blood glucose may be affected in two ways:  Stress hormones may cause your blood glucose to rise.  You may be distracted from taking good care of yourself. It is a good idea to be aware of your stress level and make changes that are necessary to help you better manage challenging situations. Support groups, planned relaxation, a hobby you enjoy, meditation, healthy relationships, and exercise all work to lower your stress level. If your efforts do not seem to be helping, get help from your caregiver or a trained mental health professional. Document Released: 05/28/2011 Document Revised: 12/02/2011 Document Reviewed: 05/28/2011 [employer] Patient Information 2013 [place], [place].   Good snacks are fruit and nuts and 1 oz of cheeze

## 2013-01-15 NOTE — Progress Notes (Signed)
  Subjective:    Patient ID: Holly Arellano, female    DOB: 09/09/1956, 57 y.o.   MRN: [id number]  HPI Increased appetite and thirst Concerned about DM Possible prediabetes Allergies and eye drainage seen in urgent care    Review of Systems  Constitutional: Negative for activity change, appetite change and fatigue.  HENT: Positive for congestion, rhinorrhea and postnasal drip. Negative for ear pain, neck pain and sinus pressure.   Eyes: Positive for pain, discharge, redness and itching. Negative for visual disturbance.  Respiratory: Negative for cough, shortness of breath and wheezing.   Gastrointestinal: Negative for abdominal pain and abdominal distention.  Genitourinary: Negative for dysuria, frequency and menstrual problem.  Musculoskeletal: Negative for myalgias, joint swelling and arthralgias.  Skin: Negative for rash and wound.  Neurological: Negative for dizziness, weakness and headaches.  Hematological: Negative for adenopathy. Does not bruise/bleed easily.  Psychiatric/Behavioral: Negative for sleep disturbance and decreased concentration.       Objective:   Physical Exam  Nursing note and vitals reviewed. Constitutional: She is oriented to person, place, and time. She appears well-developed and well-nourished.  Eyes: Conjunctivae are normal. Pupils are equal, round, and reactive to light.  Red and inflammed and itching  Cardiovascular: Normal rate and regular rhythm.   Pulmonary/Chest: Effort normal and breath sounds normal.  Abdominal: Soft. Bowel sounds are normal.  Neurological: She is alert and oriented to person, place, and time.          Assessment & Plan:  Healthy snack between meals Increased allergies and sinus pressure Power steroid spray for seasonal allergies Monitor a1c and bmet

## 2013-05-06 ENCOUNTER — Encounter: Payer: Self-pay | Admitting: Internal Medicine

## 2013-05-18 ENCOUNTER — Ambulatory Visit (INDEPENDENT_AMBULATORY_CARE_PROVIDER_SITE_OTHER): Payer: 59 | Admitting: Family Medicine

## 2013-05-18 ENCOUNTER — Telehealth: Payer: Self-pay | Admitting: Internal Medicine

## 2013-05-18 ENCOUNTER — Encounter: Payer: Self-pay | Admitting: Family Medicine

## 2013-05-18 VITALS — BP 124/86 | Temp 98.2°F | Wt 213.0 lb

## 2013-05-18 DIAGNOSIS — J029 Acute pharyngitis, unspecified: Secondary | ICD-10-CM

## 2013-05-18 DIAGNOSIS — R0600 Dyspnea, unspecified: Secondary | ICD-10-CM

## 2013-05-18 DIAGNOSIS — R0609 Other forms of dyspnea: Secondary | ICD-10-CM

## 2013-05-18 DIAGNOSIS — R0989 Other specified symptoms and signs involving the circulatory and respiratory systems: Secondary | ICD-10-CM

## 2013-05-18 DIAGNOSIS — J302 Other seasonal allergic rhinitis: Secondary | ICD-10-CM

## 2013-05-18 DIAGNOSIS — J309 Allergic rhinitis, unspecified: Secondary | ICD-10-CM

## 2013-05-18 NOTE — Progress Notes (Signed)
Chief Complaint  Patient presents with  . Sore Throat    HPI:  Acute visit for sore throat: -started:yesterday -symptoms: nasal congestion, drainage, cough, sore throat, has allergies - chronic pnd, nasal congestion, sneezing, cough -denies: fevers, NVD -exposure to strep: yes - with grandson last weekend -no taking antihistamine or nasal spray for her allergies - didn't know if should take it  ROS: See pertinent positives and negatives per HPI.  Past Medical History  Diagnosis Date  . GERD (gastroesophageal reflux disease)   . Hypertension     Past Surgical History  Procedure Laterality Date  . Cholecystectomy    . Knee surgery  1994  . Trauma to aerm in machine   1996    Family History  Problem Relation Age of Onset  . Diabetes Mother   . Hypertension Father     History   Social History  . Marital Status: Married    Spouse Name: N/A    Number of Children: N/A  . Years of Education: N/A   Social History Main Topics  . Smoking status: Never Smoker   . Smokeless tobacco: None  . Alcohol Use: No  . Drug Use: No  . Sexual Activity: None   Other Topics Concern  . None   Social History Narrative  . None    Current outpatient prescriptions:bisoprolol-hydrochlorothiazide (ZIAC) 2.5-6.25 MG per tablet, Take 1 tablet by mouth daily., Disp: 30 tablet, Rfl: 11;  mometasone (NASONEX) 50 MCG/ACT nasal spray, Place 2 sprays into the nose daily., Disp: 17 g, Rfl: 12;  montelukast (SINGULAIR) 10 MG tablet, Take 1 tablet (10 mg total) by mouth at bedtime., Disp: 30 tablet, Rfl: 11  EXAM:  Filed Vitals:   05/18/13 0959  BP: 124/86  Temp: 98.2 F (36.8 C)    Body mass index is 34.4 kg/(m^2).  GENERAL: vitals reviewed and listed above, alert, oriented, appears well hydrated and in no acute distress  HEENT: atraumatic, conjunttiva clear, no obvious abnormalities on inspection of external nose and ears, normal appearance of ear canals and TMs, clear nasal congestion  with pale boggy turbinates, mild post oropharyngeal erythema with PND and cobblestoning, no tonsillar edema or exudate, no sinus TTP  NECK: no obvious masses on inspection  LUNGS: clear to auscultation bilaterally, no wheezes, rales or rhonchi, good air movement  CV: HRRR, no peripheral edema  MS: moves all extremities without noticeable abnormality  PSYCH: pleasant and cooperative, no obvious depression or anxiety  ASSESSMENT AND PLAN:  Discussed the following assessment and plan:  Sore throat - Plan: POCT rapid strep A, Throat culture ([facility])  Seasonal allergies  PND (paroxysmal nocturnal dyspnea)  -this is more likely uncontrolled allergies per hx and exam and she is not taking medications -advised daily INS and antihistamine, singulair if this in not working in 2 weeks and follow up with PCP -rapid strep neg, culture pending and will do abx if positive - she denies any abx allergies -Patient advised to return or notify a doctor immediately if symptoms worsen or persist or new concerns arise.  There are no Patient Instructions on file for this visit.   Kriste Basque R.

## 2013-05-18 NOTE — Telephone Encounter (Signed)
Patient Information:  Caller Name: [facility]  Phone: [phone]  Patient: Holly Arellano, Holly Arellano  Gender: Female  DOB: [birth year]  Age: 57 Years  PCP: Darryll Capers (Adults only)  Office Follow Up:  Does the office need to follow up with this patient?: No  Instructions For The Office: N/A  RN Note:  pt reports her throat pain is moderate-severe  Symptoms  Reason For Call & Symptoms: pt was seen in UC 3 weeks ago but symptoms did improve.  Pt states now she is having sore throat, headache.  Pt states she was exposed to strep throat over the weekend (grandchild)  Reviewed Health History In EMR: Yes  Reviewed Medications In EMR: Yes  Reviewed Allergies In EMR: Yes  Reviewed Surgeries / Procedures: Yes  Date of Onset of Symptoms: 05/16/2013  Guideline(s) Used:  Sore Throat  Disposition Per Guideline:   See Today in Office  Reason For Disposition Reached:   Patient wants to be seen  Advice Given:  N/A  Patient Will Follow Care Advice:  YES  Appointment Scheduled:  05/18/2013 10:00:00 Appointment Scheduled Provider:  Kriste Basque (Family Practice)

## 2013-05-18 NOTE — Patient Instructions (Signed)
-  taking the nasonex every day and claritin OR zyrtec daily  -if symptoms improved but not resolved in 2 weeks, also take the singulair daily  -follow up with your doctor if allergy symptoms persist  -We have ordered labs or studies at this visit. It can take up to 1-2 weeks for results and processing. We will contact you with instructions IF your results are abnormal. Normal results will be released to your [facility]. If you have not heard from [place] or can not find your results in [facility] in 2 weeks please contact our office.

## 2013-05-20 LAB — CULTURE, GROUP A STREP

## 2013-05-21 NOTE — Progress Notes (Signed)
Quick Note:  Left a detailed message for pt at personalized voicemail. ______

## 2013-05-28 ENCOUNTER — Ambulatory Visit (INDEPENDENT_AMBULATORY_CARE_PROVIDER_SITE_OTHER)
Admission: RE | Admit: 2013-05-28 | Discharge: 2013-05-28 | Disposition: A | Discharge status: Home / Self Care | Payer: 59 | Source: Ambulatory Visit | Attending: Family Medicine | Admitting: Family Medicine

## 2013-05-28 ENCOUNTER — Ambulatory Visit (INDEPENDENT_AMBULATORY_CARE_PROVIDER_SITE_OTHER): Payer: 59 | Admitting: Family Medicine

## 2013-05-28 ENCOUNTER — Encounter: Payer: Self-pay | Admitting: Family Medicine

## 2013-05-28 ENCOUNTER — Telehealth: Payer: Self-pay | Admitting: Internal Medicine

## 2013-05-28 VITALS — BP 120/80 | Temp 98.3°F | Wt 212.0 lb

## 2013-05-28 DIAGNOSIS — R7303 Prediabetes: Secondary | ICD-10-CM

## 2013-05-28 DIAGNOSIS — E785 Hyperlipidemia, unspecified: Secondary | ICD-10-CM

## 2013-05-28 DIAGNOSIS — E669 Obesity, unspecified: Secondary | ICD-10-CM

## 2013-05-28 DIAGNOSIS — M25562 Pain in left knee: Secondary | ICD-10-CM

## 2013-05-28 DIAGNOSIS — M25569 Pain in unspecified knee: Secondary | ICD-10-CM

## 2013-05-28 DIAGNOSIS — R7309 Other abnormal glucose: Secondary | ICD-10-CM

## 2013-05-28 NOTE — Progress Notes (Signed)
Chief Complaint  Patient presents with  . left knee pain    HPI:  [facility], is a 57 yo F patient of Dr. Lovell Sheehan here for an acute visit for L knee pain: -started a few weeks ago but worsened after starting to walk with husband last week -she was walking when first noticed - sometimes makes popping sound -hx of arthroscopy on this knee remotely -worse with activity -better with rest, ibuprofen, bengay -denies:fevers, chills, swelling, giving away, fall, catching  Obesity/HLD/Prediabetes: -wanted to review labs done by PCp in last year -wonders how to eat healthy  ROS: See pertinent positives and negatives per HPI.  Past Medical History  Diagnosis Date  . GERD (gastroesophageal reflux disease)   . Hypertension     Past Surgical History  Procedure Laterality Date  . Cholecystectomy    . Knee surgery  1994  . Trauma to aerm in machine   1996    Family History  Problem Relation Age of Onset  . Diabetes Mother   . Hypertension Father     History   Social History  . Marital Status: Married    Spouse Name: N/A    Number of Children: N/A  . Years of Education: N/A   Social History Main Topics  . Smoking status: Never Smoker   . Smokeless tobacco: None  . Alcohol Use: No  . Drug Use: No  . Sexual Activity: None   Other Topics Concern  . None   Social History Narrative  . None    Current outpatient prescriptions:bisoprolol-hydrochlorothiazide (ZIAC) 2.5-6.25 MG per tablet, Take 1 tablet by mouth daily., Disp: 30 tablet, Rfl: 11;  mometasone (NASONEX) 50 MCG/ACT nasal spray, Place 2 sprays into the nose daily., Disp: 17 g, Rfl: 12;  montelukast (SINGULAIR) 10 MG tablet, Take 1 tablet (10 mg total) by mouth at bedtime., Disp: 30 tablet, Rfl: 11  EXAM:  Filed Vitals:   05/28/13 1400  BP: 120/80  Temp: 98.3 F (36.8 C)    Body mass index is 34.23 kg/(m^2).  GENERAL: vitals reviewed and listed above, alert, oriented, appears well hydrated and in no  acute distress  HEENT: atraumatic, conjunttiva clear, no obvious abnormalities on inspection of external nose and ears  NECK: no obvious masses on inspection  MS: moves all extremities without noticeable abnormality -gait: walks with feet turned out, slightly more flat footed on L, gait not antalgic -on inspection, query small effusion, not erythema or warmth -TTP rather diffusely, however most TTP at pes anserine and lateral patella patellar crepitus -neg tests: lachman, valgus and varus stress, mcmurry, drawer -she has difficulty with 1 leg squat -no erythema or swelling above or blow knee and NV intact distally  PSYCH: pleasant and cooperative, no obvious depression or anxiety  ASSESSMENT AND PLAN:  Discussed the following assessment and plan:  Left knee pain - Plan: DG Knee Complete 4 Views Left -possible pes anserine bursitis, PFS versus OA likely -she has some mechanical issues and is obese,  likely contributing to strain on knees -will get some plain films, may refer to ortho given hx surgery on this knee if plain films indicate, if worsening or if persists -conservative tx in meantime with return precautions discussed -regardless follow up with PCP in 1 month  Obesity, unspecified  Prediabetes  Hyperlipemia  -discussed lifestyle recs, various diets at length and she seems interested in trying the paleo diet  -Patient advised to return or notify a doctor immediately if symptoms worsen or persist  or new concerns arise.  Patient Instructions  -lets get some xrays of your knee - please go to [facility] to get these done - we will contact you in the next week about these results  -in the meantime advise ice for 10-15 minutes twice daily, tylenol 500-1000mg  up to 3 times daily if needed and ibuprofen and bengay if needed per instructions  -gentle activity  -We recommend the following healthy lifestyle measures: - eat a healthy diet consisting of lots of vegetables,  fruits, beans, nuts, seeds, healthy meats such as white chicken and fish and whole grains.  - avoid fried foods, fast food, processed foods, sodas, red meet and other fattening foods.  - get a least 150 minutes of aerobic exercise per week.  The diet we talked about is the Paleo Diet - can find information, books and recipes on line  Follow up with your doctor in 1 month or sooner if worsening or other concerns     KIM, HANNAH R.

## 2013-05-28 NOTE — Telephone Encounter (Signed)
Patient Information:  Caller Name: [facility]  Phone: [phone]  Patient: Holly, Arellano  Gender: Female  DOB: 05/05/1966  Age: 57 Years  PCP: Darryll Capers (Adults only)  Pregnant: No  Office Follow Up:  Does the office need to follow up with this patient?: No  Instructions For The Office: N/A   Symptoms  Reason For Call & Symptoms: Left leg pain intermittently started 3 weeks ago, for the last 2 weeks pain has constant in front of knee, posterior of calf with tenderness to touch.  Walking causes increased pain.  Patient requesting Appt today  Reviewed Health History In EMR: Yes  Reviewed Medications In EMR: Yes  Reviewed Allergies In EMR: Yes  Reviewed Surgeries / Procedures: Yes  Date of Onset of Symptoms: Unknown  Treatments Tried: Wearing knee brace, applying BenGay  Treatments Tried Worked: Yes OB / GYN:  LMP: Unknown  Guideline(s) Used:  Leg Pain  Disposition Per Guideline:   Go to ED Now (or to Office with PCP Approval)  Reason For Disposition Reached:   Thigh or calf pain in only one leg and present > 1 hour  Advice Given:  N/A  Patient Will Follow Care Advice:  YES  Appointment Scheduled:  05/28/2013 14:00:00 Appointment Scheduled Provider:  Kriste Basque (Family Practice)

## 2013-05-28 NOTE — Patient Instructions (Addendum)
-  lets get some xrays of your knee - please go to [facility] to [facility] these done - we will contact you in the next week about these results  -in the meantime advise ice for 10-15 minutes twice daily, tylenol 500-1000mg  up to 3 times daily if needed and ibuprofen and bengay if needed per instructions  -gentle activity  -We recommend the following healthy lifestyle measures: - eat a healthy diet consisting of lots of vegetables, fruits, beans, nuts, seeds, healthy meats such as white chicken and fish and whole grains.  - avoid fried foods, fast food, processed foods, sodas, red meet and other fattening foods.  - get a least 150 minutes of aerobic exercise per week.  The diet we talked about is the Paleo Diet - can find information, books and recipes on line  Follow up with your doctor in 1 month or sooner if worsening or other concerns

## 2013-06-02 NOTE — Progress Notes (Signed)
Quick Note:  Called and spoke with pt and pt is aware. Pt would like referral. ______

## 2013-06-03 ENCOUNTER — Other Ambulatory Visit: Payer: Self-pay | Admitting: Family Medicine

## 2013-06-03 DIAGNOSIS — M25562 Pain in left knee: Secondary | ICD-10-CM

## 2013-06-03 NOTE — Progress Notes (Signed)
Quick Note:    Referral placed  ______

## 2013-10-29 ENCOUNTER — Ambulatory Visit (INDEPENDENT_AMBULATORY_CARE_PROVIDER_SITE_OTHER): Payer: 59 | Admitting: Internal Medicine

## 2013-10-29 ENCOUNTER — Encounter: Payer: Self-pay | Admitting: Internal Medicine

## 2013-10-29 VITALS — BP 130/86 | HR 83 | Temp 98.2°F | Resp 20 | Ht 66.0 in | Wt 217.0 lb

## 2013-10-29 DIAGNOSIS — R42 Dizziness and giddiness: Secondary | ICD-10-CM

## 2013-10-29 DIAGNOSIS — J309 Allergic rhinitis, unspecified: Secondary | ICD-10-CM

## 2013-10-29 LAB — GLUCOSE, POCT (MANUAL RESULT ENTRY): POC Glucose: 106 mg/dl — AB (ref 70–99)

## 2013-10-29 NOTE — Progress Notes (Signed)
Subjective:    Patient ID: Holly Arellano, female    DOB: 12-Feb-1956, 58 y.o.   MRN: [id number]  HPI   58 year old patient who has a history of treated hypertension but presently is off medications. She has a history of allergic rhinitis. She has a number of complaints including dizziness fatigue sore throat dry cough and just a general sense of unwellness. She is concerned about food allergies and possible diabetes.  Past Medical History  Diagnosis Date  . GERD (gastroesophageal reflux disease)   . Hypertension     History   Social History  . Marital Status: Married    Spouse Name: N/A    Number of Children: N/A  . Years of Education: N/A   Occupational History  . Not on file.   Social History Main Topics  . Smoking status: Never Smoker   . Smokeless tobacco: Not on file  . Alcohol Use: No  . Drug Use: No  . Sexual Activity: Not on file   Other Topics Concern  . Not on file   Social History Narrative  . No narrative on file    Past Surgical History  Procedure Laterality Date  . Cholecystectomy    . Knee surgery  1994  . Trauma to aerm in machine   1996    Family History  Problem Relation Age of Onset  . Diabetes Mother   . Hypertension Father     No Known Allergies  Current Outpatient Prescriptions on File Prior to Visit  Medication Sig Dispense Refill  . mometasone (NASONEX) 50 MCG/ACT nasal spray Place 2 sprays into the nose daily.  17 g  12  . montelukast (SINGULAIR) 10 MG tablet Take 1 tablet (10 mg total) by mouth at bedtime.  30 tablet  11   No current facility-administered medications on file prior to visit.    BP 130/86  Pulse 83  Temp(Src) 98.2 F (36.8 C) (Oral)  Resp 20  Ht 5\' 6"  (1.676 m)  Wt 217 lb (98.431 kg)  BMI 35.04 kg/m2  SpO2 98%       Review of Systems  Constitutional: Positive for fatigue.  HENT: Positive for postnasal drip and sore throat. Negative for congestion, dental problem, hearing loss, rhinorrhea, sinus  pressure and tinnitus.   Eyes: Negative for pain, discharge and visual disturbance.  Respiratory: Negative for cough and shortness of breath.   Cardiovascular: Negative for chest pain, palpitations and leg swelling.  Gastrointestinal: Negative for nausea, vomiting, abdominal pain, diarrhea, constipation, blood in stool and abdominal distention.  Genitourinary: Negative for dysuria, urgency, frequency, hematuria, flank pain, vaginal bleeding, vaginal discharge, difficulty urinating, vaginal pain and pelvic pain.  Musculoskeletal: Negative for arthralgias, gait problem and joint swelling.  Skin: Negative for rash.  Neurological: Positive for light-headedness. Negative for dizziness, syncope, speech difficulty, weakness, numbness and headaches.  Hematological: Negative for adenopathy.  Psychiatric/Behavioral: Negative for behavioral problems, dysphoric mood and agitation. The patient is not nervous/anxious.        Objective:   Physical Exam  Constitutional: She is oriented to person, place, and time. She appears well-developed and well-nourished.  Obese;  repeat blood pressure improved at 122/82   HENT:  Head: Normocephalic.  Right Ear: External ear normal.  Left Ear: External ear normal.  Mouth/Throat: Oropharynx is clear and moist.  Low hanging soft palate with pharyngeal crowding  Eyes: Conjunctivae and EOM are normal. Pupils are equal, round, and reactive to light.  Neck: Normal range of motion. Neck supple.  No thyromegaly present.  Cardiovascular: Normal rate, regular rhythm, normal heart sounds and intact distal pulses.   Pulmonary/Chest: Effort normal and breath sounds normal.  Abdominal: Soft. Bowel sounds are normal. She exhibits no mass. There is no tenderness.  Musculoskeletal: Normal range of motion.  Lymphadenopathy:    She has no cervical adenopathy.  Neurological: She is alert and oriented to person, place, and time.  Skin: Skin is warm and dry. No rash noted.    Psychiatric: She has a normal mood and affect. Her behavior is normal.          Assessment & Plan:   Allergic rhinitis Probable mild viral syndrome. Exam unremarkable. We'll schedule CPX in 3 months with PCP

## 2013-10-29 NOTE — Progress Notes (Signed)
Pre-visit discussion using our clinic review tool. No additional management support is needed unless otherwise documented below in the visit note.  

## 2013-10-29 NOTE — Patient Instructions (Signed)
Limit your sodium (Salt) intake    It is important that you exercise regularly, at least 20 minutes 3 to 4 times per week.  If you develop chest pain or shortness of breath seek  medical attention.  You need to lose weight.  Consider a lower calorie diet and regular exercise.  Return in 3 months for follow-up  

## 2014-02-21 ENCOUNTER — Encounter: Payer: Self-pay | Admitting: Family Medicine

## 2014-02-21 ENCOUNTER — Ambulatory Visit (INDEPENDENT_AMBULATORY_CARE_PROVIDER_SITE_OTHER): Payer: 59 | Admitting: Family Medicine

## 2014-02-21 VITALS — BP 122/80 | HR 79 | Temp 99.5°F | Ht 66.0 in | Wt 216.0 lb

## 2014-02-21 DIAGNOSIS — J329 Chronic sinusitis, unspecified: Secondary | ICD-10-CM

## 2014-02-21 DIAGNOSIS — J309 Allergic rhinitis, unspecified: Secondary | ICD-10-CM

## 2014-02-21 MED ORDER — HYDROCOD POLST-CHLORPHEN POLST 10-8 MG/5ML PO LQCR: 5.0000 mL | Freq: Two times a day (BID) | ORAL | Status: DC | PRN

## 2014-02-21 MED ORDER — DOXYCYCLINE HYCLATE 100 MG PO TABS: 100.0000 mg | ORAL_TABLET | Freq: Two times a day (BID) | ORAL | Status: DC

## 2014-02-21 MED ORDER — FLUTICASONE PROPIONATE 50 MCG/ACT NA SUSP: 2.0000 | Freq: Every day | NASAL | Status: DC

## 2014-02-21 NOTE — Progress Notes (Signed)
No chief complaint on file.   HPI:  Upper Resp Infection: -started: 2 weeks ago and treated with amoxicillin at urgent care for sinusitis -symptoms:improvedbut then returned when abx stopped recently, nasal congestion, drainage, sneezing, mild max sinus discomfort, cough -denies:fever, SOB, NVD -has tried: musinex, amoxicillin -sick contacts/travel/risks: denies flu exposure, tick exposure or or Ebola risks -Hx of: allergies ROS: See pertinent positives and negatives per HPI.  Past Medical History  Diagnosis Date  . GERD (gastroesophageal reflux disease)   . Hypertension     Past Surgical History  Procedure Laterality Date  . Cholecystectomy    . Knee surgery  1994  . Trauma to aerm in machine   1996    Family History  Problem Relation Age of Onset  . Diabetes Mother   . Hypertension Father     History   Social History  . Marital Status: Married    Spouse Name: N/A    Number of Children: N/A  . Years of Education: N/A   Social History Main Topics  . Smoking status: Never Smoker   . Smokeless tobacco: None  . Alcohol Use: No  . Drug Use: No  . Sexual Activity: None   Other Topics Concern  . None   Social History Narrative  . None    Current outpatient prescriptions:Fexofenadine HCl (MUCINEX ALLERGY PO), Take by mouth., Disp: , Rfl: ;  chlorpheniramine-HYDROcodone (TUSSIONEX PENNKINETIC ER) 10-8 MG/5ML LQCR, Take 5 mLs by mouth every 12 (twelve) hours as needed., Disp: 115 mL, Rfl: 0;  doxycycline (VIBRA-TABS) 100 MG tablet, Take 1 tablet (100 mg total) by mouth 2 (two) times daily., Disp: 20 tablet, Rfl: 0 fluticasone (FLONASE) 50 MCG/ACT nasal spray, Place 2 sprays into both nostrils daily., Disp: 16 g, Rfl: 1  EXAM:  Filed Vitals:   02/21/14 1010  BP: 122/80  Pulse: 79  Temp: 99.5 F (37.5 C)    Body mass index is 34.88 kg/(m^2).  GENERAL: vitals reviewed and listed above, alert, oriented, appears well hydrated and in no acute distress  HEENT:  atraumatic, conjunttiva clear, no obvious abnormalities on inspection of external nose and ears, normal appearance of ear canals and TMs, clear nasal congestion with boggy turbinates, mild post oropharyngeal erythema with PND, no tonsillar edema or exudate, no sinus TTP  NECK: no obvious masses on inspection  LUNGS: clear to auscultation bilaterally, no wheezes, rales or rhonchi, good air movement  CV: HRRR, no peripheral edema  MS: moves all extremities without noticeable abnormality  PSYCH: pleasant and cooperative, no obvious depression or anxiety  ASSESSMENT AND PLAN:  Discussed the following assessment and plan:  Rhinosinusitis - Plan: chlorpheniramine-HYDROcodone (TUSSIONEX PENNKINETIC ER) 10-8 MG/5ML LQCR, doxycycline (VIBRA-TABS) 100 MG tablet, fluticasone (FLONASE) 50 MCG/ACT nasal spray  ALLERGIC RHINITIS  -given HPI and exam findings today, a serious infection or illness is unlikely. We discussed potential etiologies, with VURI being most likely, and advised supportive care and monitoring. We discussed treatment side effects, likely course, antibiotic misuse, transmission, and signs of developing a serious illness. -more likely AR per exam and advised tx of allergies -in case worsens with doxy given history possible sinusitis and was tx with amox -of course, we advised to return or notify a doctor immediately if symptoms worsen or persist or new concerns arise.    Patient Instructions  -start allegra daily and flonase daily for 1 month  -if not improving or worsening take doxycycline and if see a doctor if feeling sick -As we discussed, we have  prescribed a new medication (DOXYCYCLINE) for you at this appointment. We discussed the common and serious potential adverse effects of this medication and you can review these and more with the pharmacist when you pick up your medication.  Please follow the instructions for use carefully and notify us immediately if you have any  problems taking this medication.  You have decided to try a medication that is a controlled substance (CODIENE COUGH MEDICATION) for treatment of your medical problem. While this medication has benefits and can help you with your illness, as we discussed, it also has considerable risks. You have been properly informed of the risks and alternatives and re-iterate here that this medication can cause:  Altered Mental Capacity or Alertness: Do not drive or operate machinery while taking this medication  I advise: -that you use as little of this medication as possible and only as directed for as short of time as possible -keep this medication in a safe and locked location away from children or others -do not share this medication -dispose of any unused medication in a medication drop box -do not take this medication with other controlled or sedating substances, alcohol or drugs other then those approved by your doctor   Follow up as needed        Terressa KoyanagiHannah R. Kim

## 2014-02-21 NOTE — Progress Notes (Signed)
Pre visit review using our clinic review tool, if applicable. No additional management support is needed unless otherwise documented below in the visit note. 

## 2014-02-21 NOTE — Patient Instructions (Signed)
-  start allegra daily and flonase daily for 1 month  -if not improving or worsening take doxycycline and if see a doctor if feeling sick -As we discussed, we have prescribed a new medication (DOXYCYCLINE) for you at this appointment. We discussed the common and serious potential adverse effects of this medication and you can review these and more with the pharmacist when you pick up your medication.  Please follow the instructions for use carefully and notify us immediately if you have any problems taking this medication.  You have decided to try a medication that is a controlled substance (CODIENE COUGH MEDICATION) for treatment of your medical problem. While this medication has benefits and can help you with your illness, as we discussed, it also has considerable risks. You have been properly informed of the risks and alternatives and re-iterate here that this medication can cause:  Altered Mental Capacity or Alertness: Do not drive or operate machinery while taking this medication  I advise: -that you use as little of this medication as possible and only as directed for as short of time as possible -keep this medication in a safe and locked location away from children or others -do not share this medication -dispose of any unused medication in a medication drop box -do not take this medication with other controlled or sedating substances, alcohol or drugs other then those approved by your doctor   Follow up as needed

## 2014-05-03 ENCOUNTER — Encounter: Payer: Self-pay | Admitting: Family Medicine

## 2014-05-13 ENCOUNTER — Other Ambulatory Visit: Payer: 59

## 2014-05-17 ENCOUNTER — Telehealth: Payer: Self-pay | Admitting: *Deleted

## 2014-05-17 NOTE — Telephone Encounter (Signed)
Dr Selena Batten reviewed the left breast ultrasound from 05/06/2014 [facility]) and wanted to be sure the pt had a follow up mammogram and ultrasound in 6 months.  I called the pt and she stated she has a follow up scheduled for 10/2014.

## 2014-05-18 ENCOUNTER — Encounter: Payer: Self-pay | Admitting: Family Medicine

## 2014-05-18 ENCOUNTER — Ambulatory Visit (INDEPENDENT_AMBULATORY_CARE_PROVIDER_SITE_OTHER): Payer: 59 | Admitting: Family Medicine

## 2014-05-18 VITALS — BP 124/84 | HR 88 | Temp 98.2°F | Ht 66.75 in | Wt 215.0 lb

## 2014-05-18 DIAGNOSIS — E785 Hyperlipidemia, unspecified: Secondary | ICD-10-CM

## 2014-05-18 DIAGNOSIS — Z0000 Encounter for general adult medical examination without abnormal findings: Secondary | ICD-10-CM

## 2014-05-18 DIAGNOSIS — Z23 Encounter for immunization: Secondary | ICD-10-CM

## 2014-05-18 DIAGNOSIS — E669 Obesity, unspecified: Secondary | ICD-10-CM

## 2014-05-18 DIAGNOSIS — R739 Hyperglycemia, unspecified: Secondary | ICD-10-CM

## 2014-05-18 DIAGNOSIS — R7309 Other abnormal glucose: Secondary | ICD-10-CM

## 2014-05-18 DIAGNOSIS — J309 Allergic rhinitis, unspecified: Secondary | ICD-10-CM

## 2014-05-18 LAB — LIPID PANEL
CHOLESTEROL: 198 mg/dL (ref 0–200)
HDL: 34.2 mg/dL — ABNORMAL LOW (ref >39.00)
LDL Cholesterol: 127 mg/dL — ABNORMAL HIGH (ref 0–99)
NONHDL: 163.8
Total CHOL/HDL Ratio: 6
Triglycerides: 182 mg/dL — ABNORMAL HIGH (ref 0.0–149.0)
VLDL: 36.4 mg/dL (ref 0.0–40.0)

## 2014-05-18 LAB — HEMOGLOBIN A1C: HEMOGLOBIN A1C: 6 % (ref 4.6–6.5)

## 2014-05-18 NOTE — Addendum Note (Signed)
Addended by: Johnella Moloney on: 05/18/2014 09:05 AM   Modules accepted: Orders

## 2014-05-18 NOTE — Patient Instructions (Signed)
-  We have ordered labs or studies at this visit. It can take up to 1-2 weeks for results and processing. We will contact you with instructions IF your results are abnormal. Normal results will be released to your [facility]. If you have not heard from [place] or can not find your results in [facility] in 2 weeks please contact our office.  -Vitamin D 1000 IU daily; calcium  total from diet and supplement if needed  -PLEASE SIGN UP FOR MYCHART TODAY   We recommend the following healthy lifestyle measures: - eat a healthy diet consisting of lots of vegetables, fruits, beans, nuts, seeds, healthy meats such as white chicken and fish and whole grains.  - avoid fried foods, fast food, processed foods, sodas, red meet and other fattening foods.  - get a least 150 minutes of aerobic exercise per week.   Follow up in: 4-6 months or sooner as needed

## 2014-05-18 NOTE — Progress Notes (Signed)
Pre visit review using our clinic review tool, if applicable. No additional management support is needed unless otherwise documented below in the visit note. 

## 2014-05-18 NOTE — Progress Notes (Signed)
No chief complaint on file.   HPI:  Here for CPE:  -Concerns and/or follow up today:   1)Prediabetes: -diet and exercise advised -denies: polyuria, polydipsia, vision changes  2)HLD: -no meds -stable  3)Obesity: -Diet: variety of foods, balance and well rounded, larger portion sizes -Exercise: no regular exercise  4)Allergic Rhinitis: -worsened when stopped allegra the last few weeks -tinnitus - has seen ENT about this per her report and told this was fine  -Taking folic acid, vitamin D or calcium: takes a multivitamin  -Diabetes and Dyslipidemia Screening: doing today  -Hx of HTN: no  -Vaccines: tdap today  -pap history: sees gyn, Dr. Adron Bene, UTD  -FDLMP: n/a  -sexual activity: yes, female partner, no new partners  -wants STI testing: no  -FH breast, colon or ovarian ca: sees FH;  Last mammogram: had mammo last week Last colon cancer screening: see HM  Breast Ca Risk Assessment:  Sees gyn  -Alcohol, Tobacco, drug use: see social history  Review of Systems - no fevers, unintentional weight loss, vision loss, hearing loss, chest pain, sob, hemoptysis, melena, hematochezia, hematuria, genital discharge, changing or concerning skin lesions, bleeding, bruising, loc, thoughts of self harm or SI  Past Medical History  Diagnosis Date  . GERD (gastroesophageal reflux disease)   . Hypertension   . HEMORRHOIDS, INTERNAL 12/15/2007    Qualifier: Diagnosis of  By: Mayford Knife [name], Domenic Polite RAYNAUD'S DISEASE 12/15/2007    Qualifier: Diagnosis of  By: Mayford Knife, [name], Domenic Polite   . Chronic maxillary sinusitis 12/15/2007    Qualifier: Diagnosis of  By: Lovell Sheehan MD, Balinda Quails   . Cough variant asthma 10/19/2008    Qualifier: Diagnosis of  By: Lovell Sheehan MD, Balinda Quails HIATAL HERNIA 12/15/2007    Qualifier: Diagnosis of  By: Mayford Knife, [name], Domenic Polite   . MAMMOGRAM, ABNORMAL 08/01/2008    Qualifier: Diagnosis of  By: Mayford Knife [name], Domenic Polite     Past Surgical History  Procedure  Laterality Date  . Cholecystectomy    . Knee surgery  1994  . Trauma to aerm in machine   1996    Family History  Problem Relation Age of Onset  . Diabetes Mother   . Hypertension Father     History   Social History  . Marital Status: Married    Spouse Name: N/A    Number of Children: N/A  . Years of Education: N/A   Social History Main Topics  . Smoking status: Never Smoker   . Smokeless tobacco: None  . Alcohol Use: No  . Drug Use: No  . Sexual Activity: None   Other Topics Concern  . 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 moist mucus membranes.  NECK: supple, no masses or lymphadenopathy  LUNGS: clear to auscultation bilaterally, no  rales, rhonchi or wheeze  CV: HRRR, no peripheral edema or cyanosis, normal pedal pulses  BREAST: declined  ABDOMEN: bowel sounds normal, soft, non tender to palpation, no masses, no rebound or guarding  GU: declined  RECTAL: refused  SKIN: no rash or abnormal lesions  MS: normal gait, moves all extremities normally  NEURO: CN II-XII grossly intact, normal muscle strength and sensation to light touch on extremities  PSYCH: normal affect, pleasant and cooperative  ASSESSMENT AND PLAN:  Discussed the following assessment and plan:  Visit for preventive health examination -Discussed and advised all [place] preventive services health task force level A and B  recommendations for age, sex and risks. -Advised at least 150 minutes of exercise per week and a healthy diet low in saturated fats and sweets and consisting of fresh fruits and vegetables, lean meats such as fish and white chicken and whole grains. -tdap today -FASTING labs, studies and vaccines per orders this encounter  Obesity, unspecified -discussed lifestyle changes and importance of healthy diet and regular exercise at length   Hyperlipemia - Plan: Lipid Panel  Hyperglycemia - Plan: Hemoglobin A1c  Allergic Rhinitis: -restart antihistamine  -Discussed and advised all [place] preventive services health task force level A and B recommendations for age, sex and risks.  -Advised at least 150 minutes of exercise per week and a healthy diet low in saturated fats and sweets and consisting of fresh fruits and vegetables, lean meats such as fish and white chicken and whole grains.  -tdap today  -FASTING labs, studies and vaccines per orders this encounter  Orders Placed This Encounter  Procedures  . Lipid Panel  . Hemoglobin A1c    Patient advised to return to clinic immediately if symptoms worsen or persist or new concerns.  Patient Instructions  -We have ordered labs or studies at this visit. It can take up to 1-2 weeks for results and processing. We will contact you with instructions IF your results are abnormal. Normal results will be released to your [facility]. If you have not heard from [place] or can not find your results in [facility] in 2 weeks please contact our office.  -Vitamin D 1000 IU daily; calcium  total from diet and supplement if needed  -PLEASE SIGN UP FOR MYCHART TODAY   We recommend the following healthy lifestyle measures: - eat a healthy diet consisting of lots of vegetables, fruits, beans, nuts, seeds, healthy meats such as white chicken and fish and whole grains.  - avoid fried foods, fast food, processed foods, sodas, red meet and other fattening foods.  - get  a least 150 minutes of aerobic exercise per week.   Follow up in: 4-6 months or sooner as needed     Return in about 4 months (around 09/17/2014) for follow up.  Kriste Basque R.

## 2014-05-20 ENCOUNTER — Encounter: Payer: 59 | Admitting: Internal Medicine

## 2014-05-20 ENCOUNTER — Encounter: Payer: 59 | Admitting: Family Medicine

## 2014-05-26 ENCOUNTER — Encounter: Payer: Self-pay | Admitting: Family Medicine

## 2014-07-18 ENCOUNTER — Emergency Department (HOSPITAL_COMMUNITY)
Admission: EM | Admit: 2014-07-18 | Discharge: 2014-07-18 | Disposition: A | Discharge status: Home / Self Care | Payer: 59 | Attending: Emergency Medicine | Admitting: Emergency Medicine

## 2014-07-18 ENCOUNTER — Encounter (HOSPITAL_COMMUNITY): Payer: Self-pay | Admitting: Emergency Medicine

## 2014-07-18 ENCOUNTER — Emergency Department (HOSPITAL_COMMUNITY): Payer: 59

## 2014-07-18 DIAGNOSIS — I10 Essential (primary) hypertension: Secondary | ICD-10-CM | POA: Diagnosis not present

## 2014-07-18 DIAGNOSIS — S99911A Unspecified injury of right ankle, initial encounter: Secondary | ICD-10-CM | POA: Diagnosis present

## 2014-07-18 DIAGNOSIS — T1490XA Injury, unspecified, initial encounter: Secondary | ICD-10-CM

## 2014-07-18 DIAGNOSIS — S93401A Sprain of unspecified ligament of right ankle, initial encounter: Secondary | ICD-10-CM | POA: Diagnosis not present

## 2014-07-18 DIAGNOSIS — Y92481 Parking lot as the place of occurrence of the external cause: Secondary | ICD-10-CM | POA: Insufficient documentation

## 2014-07-18 DIAGNOSIS — J45909 Unspecified asthma, uncomplicated: Secondary | ICD-10-CM | POA: Insufficient documentation

## 2014-07-18 DIAGNOSIS — Y9389 Activity, other specified: Secondary | ICD-10-CM | POA: Insufficient documentation

## 2014-07-18 DIAGNOSIS — Z79899 Other long term (current) drug therapy: Secondary | ICD-10-CM | POA: Insufficient documentation

## 2014-07-18 DIAGNOSIS — Z8719 Personal history of other diseases of the digestive system: Secondary | ICD-10-CM | POA: Diagnosis not present

## 2014-07-18 DIAGNOSIS — W1839XA Other fall on same level, initial encounter: Secondary | ICD-10-CM | POA: Insufficient documentation

## 2014-07-18 MED ORDER — NAPROXEN 500 MG PO TABS: 500.0000 mg | ORAL_TABLET | Freq: Two times a day (BID) | ORAL | Status: DC

## 2014-07-18 NOTE — Discharge Instructions (Signed)
Please read and follow all provided instructions.  Your diagnoses today include:  1. Injury   2. Ankle sprain, right, initial encounter     Tests performed today include:  An x-ray of your ankle - does NOT show any broken bones  Vital signs. See below for your results today.   Medications prescribed:   Naproxen - anti-inflammatory pain medication  Do not exceed 500mg  naproxen every 12 hours, take with food  You have been prescribed an anti-inflammatory medication or NSAID. Take with food. Take smallest effective dose for the shortest duration needed for your pain. Stop taking if you experience stomach pain or vomiting.   Take any prescribed medications only as directed.  Home care instructions:   Follow any educational materials contained in this packet  Follow R.I.C.E. Protocol:  R - rest your injury   I  - use ice on injury without applying directly to skin  C - compress injury with bandage or splint  E - elevate the injury as much as possible  Follow-up instructions: Please follow-up with your primary care provider or the provided orthopedic (bone specialist) if you continue to have significant pain or trouble walking in 1 week. In this case you may have a severe sprain that requires further care.   Return instructions:   Please return if your toes are numb or tingling, appear gray or blue, or you have severe pain (also elevate leg and loosen splint or wrap)  Please return to the Emergency Department if you experience worsening symptoms.   Please return if you have any other emergent concerns.  Additional Information:  Your vital signs today were: BP 157/94   Pulse 85   Temp(Src) 98.4 F (36.9 C) (Oral)   Resp 18   Ht 5\' 6"  (1.676 m)   Wt 212 lb (96.163 kg)   BMI 34.23 kg/m2   SpO2 99% If your blood pressure (BP) was elevated above 135/85 this visit, please have this repeated by your doctor within one month. -------------- Your caregiver has diagnosed you as  suffering from an ankle sprain. Ankle sprain occurs when the ligaments that hold the ankle joint together are stretched or torn. It may take 4 to 6 weeks to heal.  For Activity: If prescribed crutches, use crutches with non-weight bearing for the first few days. Then, you may walk on your ankle as the pain allows, or as instructed. Start gradually with weight bearing on the affected ankle. Once you can walk pain free, then try jogging. When you can run forwards, then you can try moving side-to-side. If you cannot walk without crutches in one week, you need a re-check. --------------

## 2014-07-18 NOTE — ED Provider Notes (Signed)
CSN: [id number]     Arrival date & time 07/18/14  2159 History  This chart was scribed for non-physician practitioner working with Holly RazorStephen Kohut, MD by Elveria Risingimelie Horne, ED Scribe. This patient was seen in room WTR6/WTR6 and the patient's care was started at 10:26 PM.   Chief Complaint  Patient presents with  . Ankle Pain  . Foot Injury   The history is provided by the patient. No language interpreter was used.   HPI Comments: Holly MedicusFlossie Mae Arellano is a 58 y.o. female who presents to the Emergency Department with a right ankle injury incurred this evening at 5pm. Patient reports walking through the ED parking lot after visiting her father, stepping into disrupted cement and twisting her ankle. Patient reports worsening pain and swelling since the injury. Patient's pain exacerbated with ambulation and bearing weight.   Past Medical History  Diagnosis Date  . GERD (gastroesophageal reflux disease)   . Hypertension   . HEMORRHOIDS, INTERNAL 12/15/2007    Qualifier: Diagnosis of  By: Mayford KnifeWilliams, [name], Domenic PoliteBonnye M   . RAYNAUD'S DISEASE 12/15/2007    Qualifier: Diagnosis of  By: Mayford KnifeWilliams, [name], Domenic PoliteBonnye M   . Chronic maxillary sinusitis 12/15/2007    Qualifier: Diagnosis of  By: Lovell SheehanJenkins MD, Balinda QuailsJohn E   . Cough variant asthma 10/19/2008    Qualifier: Diagnosis of  By: Lovell SheehanJenkins MD, Balinda QuailsJohn E   . HIATAL HERNIA 12/15/2007    Qualifier: Diagnosis of  By: Mayford KnifeWilliams, [name], Domenic PoliteBonnye M   . MAMMOGRAM, ABNORMAL 08/01/2008    Qualifier: Diagnosis of  By: Mayford KnifeWilliams, [name], Domenic PoliteBonnye M    Past Surgical History  Procedure Laterality Date  . Cholecystectomy    . Knee surgery  1994  . Trauma to aerm in machine   1996   Family History  Problem Relation Age of Onset  . Diabetes Mother   . Hypertension Father    History  Substance Use Topics  . Smoking status: Never Smoker   . Smokeless tobacco: Not on file  . Alcohol Use: No   OB History   Grav Para Term Preterm Abortions TAB SAB Ect Mult Living                 Review of Systems   Constitutional: Negative for fever, chills and activity change.  Musculoskeletal: Positive for arthralgias and gait problem. Negative for back pain, joint swelling and neck pain.  Skin: Negative for wound.  Neurological: Negative for weakness and numbness.      Allergies  Review of patient's allergies indicates no known allergies.  Home Medications   Prior to Admission medications   Medication Sig Start Date End Date Taking? Authorizing Provider  loratadine (CLARITIN) 10 MG tablet Take 10 mg by mouth daily.   Yes Historical Provider, MD  Multiple Vitamin (MULTIVITAMIN) capsule Take 1 capsule by mouth daily.   Yes Historical Provider, MD   Triage Vitals: BP 157/94  Pulse 85  Temp(Src) 98.4 F (36.9 C) (Oral)  Resp 18  Ht 5\' 6"  (1.676 m)  Wt 212 lb (96.163 kg)  BMI 34.23 kg/m2  SpO2 99%  Physical Exam  Nursing note and vitals reviewed. Constitutional: She appears well-developed and well-nourished.  HENT:  Head: Normocephalic and atraumatic.  Eyes: Conjunctivae are normal.  Neck: Normal range of motion. Neck supple.  Cardiovascular:  Pulses:      Dorsalis pedis pulses are 2+ on the right side, and 2+ on the left side.       Posterior tibial pulses are 2+  on the right side, and 2+ on the left side.  Musculoskeletal: She exhibits edema and tenderness.  Patient complains of pain with palpation of the lateral right ankle. She denies pain with palpation over the fibular head of the affected side. She denies pain in the hip of the affected side. There is tenderness over the plantar fascia as well.   Neurological: She is alert.  Distal motor, sensation, and vascular intact.   Skin: Skin is warm and dry.  Psychiatric: She has a normal mood and affect.    ED Course  Procedures (including critical care time)  COORDINATION OF CARE: 10:29 PM- Awaiting imaging. Discussed treatment plan with patient at bedside and patient agreed to plan.   Labs Review Labs Reviewed - No data to  display  Imaging Review No results found.   EKG Interpretation None      Vital signs reviewed and are as follows: Filed Vitals:   07/18/14 2207  BP: 157/94  Pulse: 85  Temp: 98.4 F (36.9 C)  Resp: 18   11:05 PM patient informed of x-ray results. Provided with ASO and crutches. Patient was counseled on RICE protocol and told to rest injury, use ice for no longer than 15 minutes every hour, compress the area, and elevate above the level of their heart as much as possible to reduce swelling. Questions answered. Patient verbalized understanding.    Encourage follow-up with Ortho in one week if not improved.  MDM   Final diagnoses:  Ankle sprain, right, initial encounter   Patient with ankle and foot injury. X-ray negative. Will treat as sprain. Conservative measures indicated with orthopedic follow-up if no improvement. Foot is neurovascularly intact. No concern for compartment syndrome.  I personally performed the services described in this documentation, which was scribed in my presence. The recorded information has been reviewed and is accurate.    Renne CriglerJoshua Calyse Murcia, PA-C 07/18/14 2307

## 2014-07-18 NOTE — ED Notes (Signed)
Pt states that she fell coming into the hospital this afternoon to visit her father; pt states that this happened around 4:30pm; pt c/o rt foot and ankle swelling; pt states that she thinks she rolled off the rt side of her ankle; no obvious deformity noted to right foot or ankle

## 2014-07-20 NOTE — ED Provider Notes (Signed)
Medical screening examination/treatment/procedure(s) were performed by non-physician practitioner and as supervising physician I was immediately available for consultation/collaboration.   EKG Interpretation None       Phyliss Hulick, MD 07/20/14 1117 

## 2014-09-21 IMAGING — CR DG KNEE COMPLETE 4+V*L*
4 series · 4 of 4 positions shown · non-contrast
Comparison: None

CLINICAL DATA: Left lateral knee pain.

EXAM:
LEFT KNEE - COMPLETE 4+ VIEW

[view not recorded (1 of 4)]
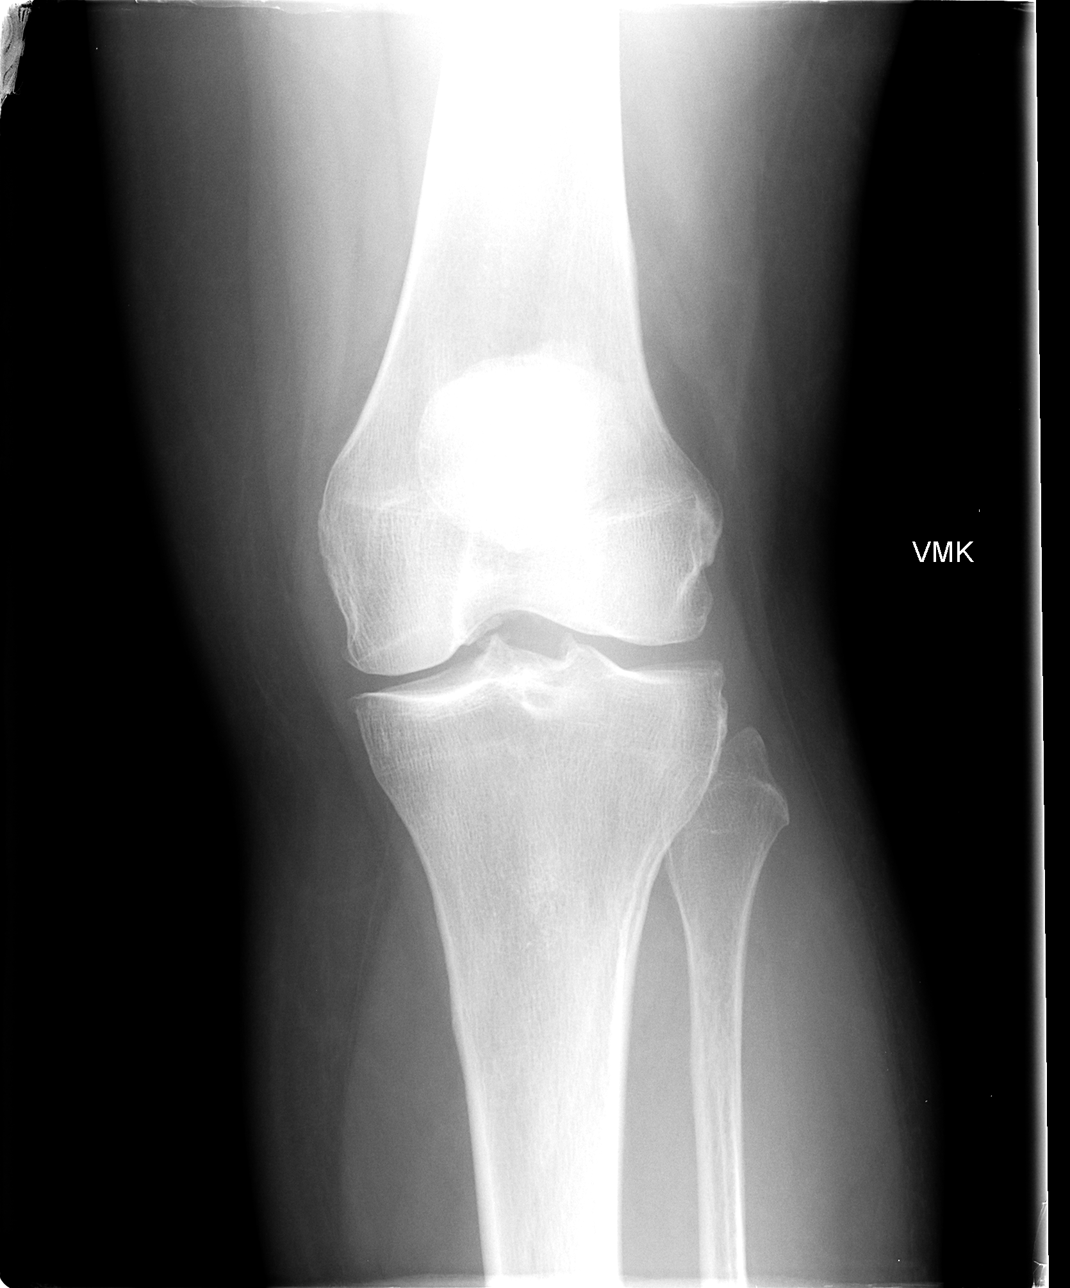

[view not recorded (2 of 4)]
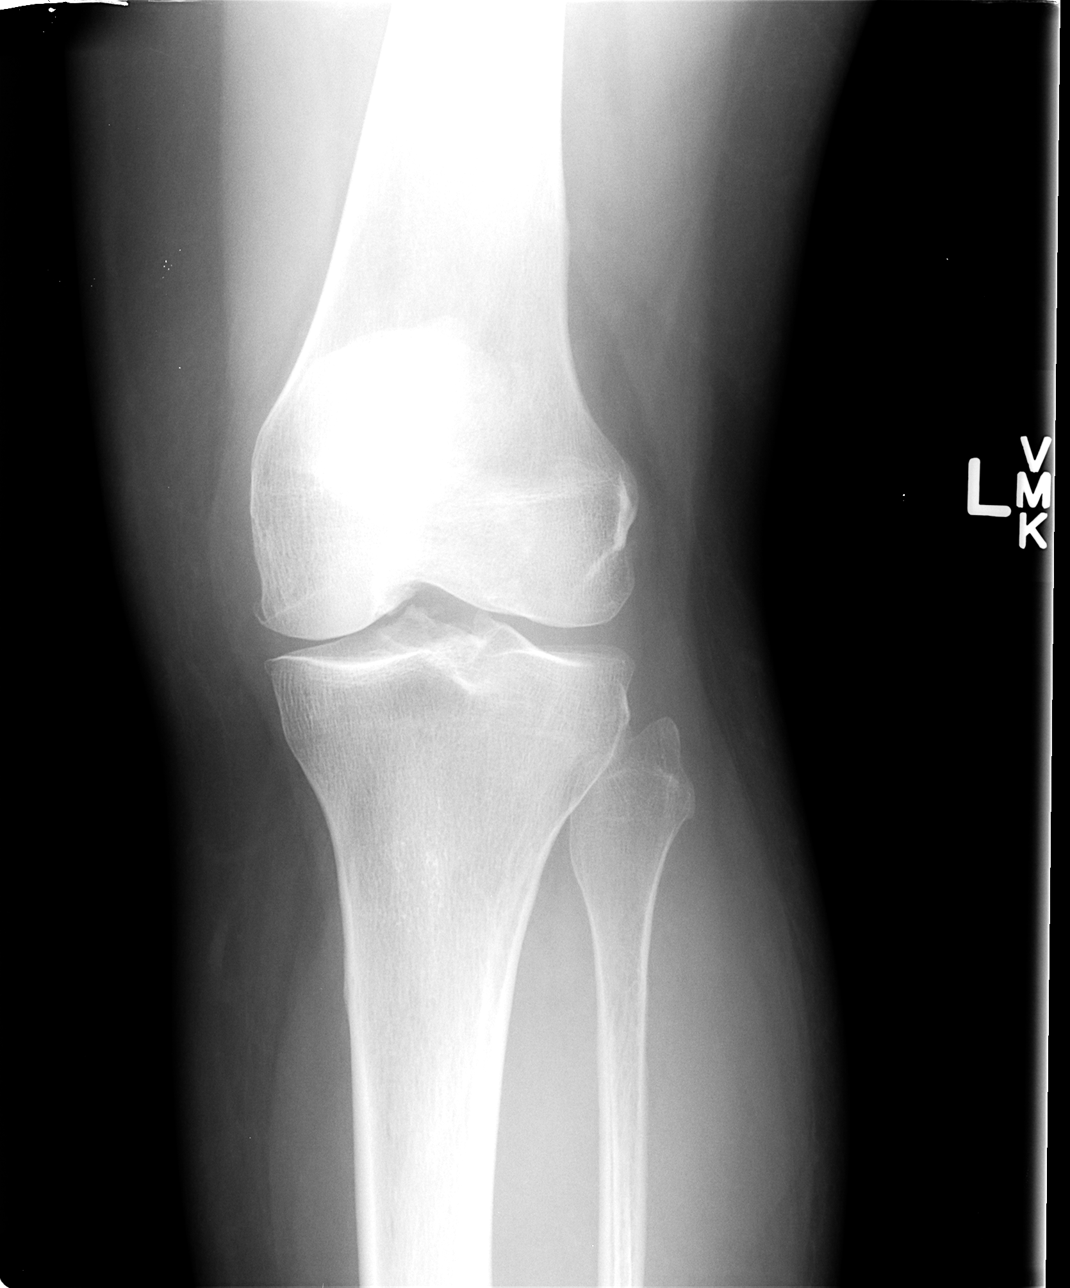

[view not recorded (3 of 4)]
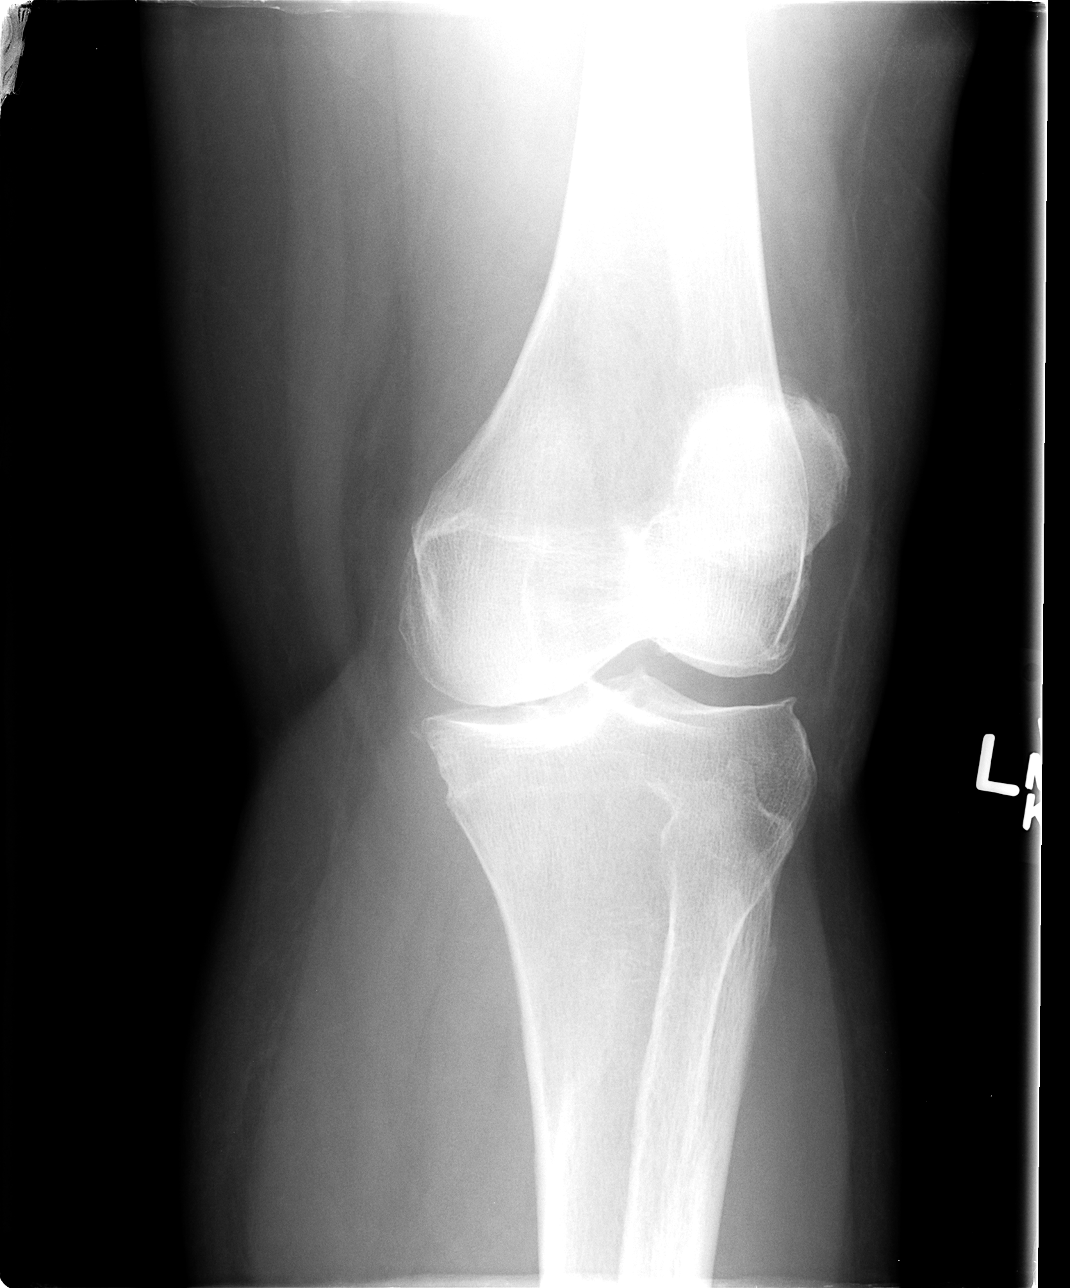

[view not recorded (4 of 4)]
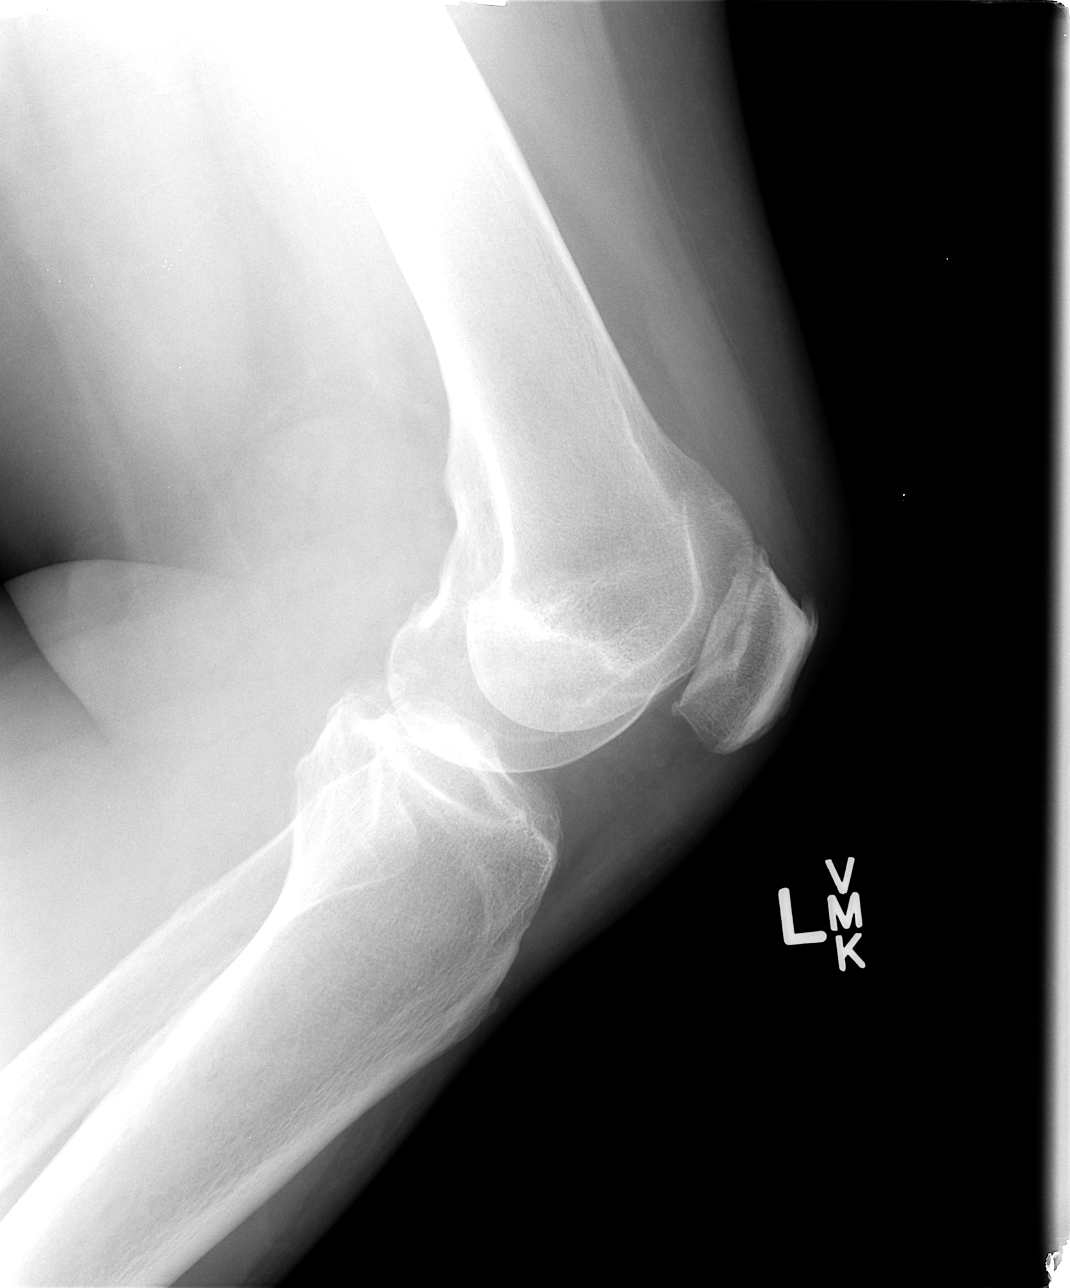

[4 of 4 positions shown; findings below may reference images not displayed]

FINDINGS: Moderate degenerative spurring within all 3 compartments of the left
knee. No acute bony abnormality. Specifically, no fracture,
subluxation, or dislocation. Soft tissues are intact. No joint
effusion.
IMPRESSION: Degenerative changes. No acute findings.

## 2015-07-05 LAB — HM MAMMOGRAPHY

## 2015-07-11 ENCOUNTER — Encounter: Payer: Self-pay | Admitting: Family Medicine

## 2015-07-18 ENCOUNTER — Encounter: Payer: Self-pay | Admitting: Family Medicine

## 2015-09-19 ENCOUNTER — Encounter: Payer: Self-pay | Admitting: Family Medicine

## 2015-09-19 ENCOUNTER — Ambulatory Visit (INDEPENDENT_AMBULATORY_CARE_PROVIDER_SITE_OTHER): Payer: 59 | Admitting: Family Medicine

## 2015-09-19 VITALS — BP 120/84 | HR 84 | Temp 97.7°F | Ht 66.0 in | Wt 213.1 lb

## 2015-09-19 DIAGNOSIS — R739 Hyperglycemia, unspecified: Secondary | ICD-10-CM | POA: Diagnosis not present

## 2015-09-19 DIAGNOSIS — E785 Hyperlipidemia, unspecified: Secondary | ICD-10-CM | POA: Diagnosis not present

## 2015-09-19 DIAGNOSIS — E669 Obesity, unspecified: Secondary | ICD-10-CM

## 2015-09-19 DIAGNOSIS — Z0000 Encounter for general adult medical examination without abnormal findings: Secondary | ICD-10-CM

## 2015-09-19 DIAGNOSIS — J309 Allergic rhinitis, unspecified: Secondary | ICD-10-CM

## 2015-09-19 LAB — LIPID PANEL
Cholesterol: 204 mg/dL — ABNORMAL HIGH (ref 0–200)
HDL: 42.4 mg/dL (ref >39.00)
LDL Cholesterol: 142 mg/dL — ABNORMAL HIGH (ref 0–99)
NONHDL: 161.55
Total CHOL/HDL Ratio: 5
Triglycerides: 98 mg/dL (ref 0.0–149.0)
VLDL: 19.6 mg/dL (ref 0.0–40.0)

## 2015-09-19 LAB — BASIC METABOLIC PANEL
BUN: 12 mg/dL (ref 6–23)
CALCIUM: 9.2 mg/dL (ref 8.4–10.5)
CHLORIDE: 110 meq/L (ref 96–112)
CO2: 29 mEq/L (ref 19–32)
CREATININE: 0.83 mg/dL (ref 0.40–1.20)
GFR: 90.37 mL/min (ref >60.00)
Glucose, Bld: 105 mg/dL — ABNORMAL HIGH (ref 70–99)
Potassium: 4.4 mEq/L (ref 3.5–5.1)
Sodium: 145 mEq/L (ref 135–145)

## 2015-09-19 NOTE — Progress Notes (Signed)
Pre visit review using our clinic review tool, if applicable. No additional management support is needed unless otherwise documented below in the visit note. 

## 2015-09-19 NOTE — Progress Notes (Signed)
HPI:  Here for CPE/ Has not been here in > 1 year.  -Concerns and/or follow up today:   1)Prediabetes: -diet and exercise advised -denies: polyuria, polydipsia, vision changes  2)HLD: -no meds -stable  3)Obesity: -Diet: variety of foods, balance and well rounded, larger portion sizes -Exercise: no regular exercise  4)Allergic Rhinitis: -takes claritin intromittently -denies: SOB, wheezing, sinus pain  -Diet: variety of foods, balance and well rounded, larger portion sizes  -Exercise: no regular exercise  -Taking folic acid, vitamin D or calcium: no  -Diabetes and Dyslipidemia Screening:FASTING for labs  -Hx of HTN: no  -Vaccines: refused flu vaccine  -pap history: sees gyn and done  -wants STI testing (Hep C if born [birth year]): wants hep c scren olnly  -FH breast, colon or ovarian ca: see FH Last mammogram: done Last colon cancer screening: done and will call GI to update FH  Breast Ca Risk Assessment: -sees gyn for breast health   -Alcohol, Tobacco, drug use: see social history  Review of Systems - no fevers, unintentional weight loss, vision loss, hearing loss, chest pain, sob, hemoptysis, melena, hematochezia, hematuria, genital discharge, changing or concerning skin lesions, bleeding, bruising, loc, thoughts of self harm or SI  Past Medical History  Diagnosis Date  . GERD (gastroesophageal reflux disease)   . Hypertension   . HEMORRHOIDS, INTERNAL 12/15/2007    Qualifier: Diagnosis of  By: Mayford Knife [name], Domenic Polite RAYNAUD'S DISEASE 12/15/2007    Qualifier: Diagnosis of  By: Mayford Knife, [name], Domenic Polite   . Chronic maxillary sinusitis 12/15/2007    Qualifier: Diagnosis of  By: Lovell Sheehan MD, Balinda Quails   . Cough variant asthma 10/19/2008    Qualifier: Diagnosis of  By: Lovell Sheehan MD, Balinda Quails HIATAL HERNIA 12/15/2007    Qualifier: Diagnosis of  By: Mayford Knife, [name], Domenic Polite   . MAMMOGRAM, ABNORMAL 08/01/2008    Qualifier: Diagnosis of  By: Mayford Knife [name], Domenic Polite      Past Surgical History  Procedure Laterality Date  . Cholecystectomy    . Knee surgery  1994  . Trauma to aerm in machine   1996    Family History  Problem Relation Age of Onset  . Diabetes Mother   . Hypertension Father     Social History   Social History  . Marital Status: Married    Spouse Name: N/A  . Number of Children: N/A  . Years of Education: N/A   Social History Main Topics  . Smoking status: Never Smoker   . Smokeless tobacco: None  . Alcohol Use: No  . Drug Use: No  . Sexual Activity: Not Asked   Other Topics Concern  . 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  loratadine (CLARITIN) 10 MG tablet, Take 10 mg by mouth daily., Disp: , Rfl:  .  Multiple Vitamin (MULTIVITAMIN) capsule, Take 1 capsule by mouth daily., Disp: , Rfl:   EXAM:  Filed Vitals:   09/19/15 0827  BP: 120/84  Pulse: 84  Temp: 97.7 F (36.5 C)    GENERAL: vitals reviewed and listed below, alert, oriented, appears well hydrated and in no acute distress  HEENT: head atraumatic,  PERRLA, normal appearance of eyes, ears, nose and mouth. moist mucus membranes.  NECK: supple, no masses or lymphadenopathy  LUNGS: clear to auscultation bilaterally, no rales, rhonchi or wheeze  CV: HRRR, no peripheral edema or cyanosis, normal pedal pulses  BREAST: declined  ABDOMEN: bowel sounds normal, soft, non tender to palpation, no masses, no rebound or guarding  GU: declined  SKIN: no rash or abnormal lesions  MS: normal gait, moves all extremities normally  NEURO: CN II-XII grossly intact, normal muscle strength and sensation to light touch on extremities  PSYCH: normal affect, pleasant and cooperative  ASSESSMENT AND PLAN:  Discussed the following assessment and plan:  Visit  for preventive health examination - Plan: Basic metabolic panel, Hepatitis C antibody  Obesity, unspecified  Hyperlipemia - Plan: Lipid Panel  Hyperglycemia  Allergic rhinitis, unspecified allergic rhinitis type   -Discussed and advised all [place] preventive services health task force level A and B recommendations for age, sex and risks.  -Advised at least 150 minutes of exercise per week and a healthy diet low in saturated fats and sweets and consisting of fresh fruits and vegetables, lean meats such as fish and white chicken and whole grains.  -labs, studies and vaccines per orders this encounter  Orders Placed This Encounter  Procedures  . Lipid Panel  . Basic metabolic panel  . Hepatitis C antibody    Patient advised to return to clinic immediately if symptoms worsen or persist or new concerns.  Patient Instructions  BEFORE YOU LEAVE: -labs -follow up appointment in 4-6 months  Try flonase (available over the counter) daily for 1 month when allergies are bad - along with your claritin.  Vit D3 [phone] IU daily  Follow up with your gastroenterologist regarding your next colonosocpy.  We recommend the following healthy lifestyle measures: - eat a healthy whole foods diet consisting of regular small meals composed of vegetables, fruits, beans, nuts, seeds, healthy meats such as white chicken and fish and whole grains.  - avoid sweets, white starchy foods, fried foods, fast food, processed foods, sodas, red meet and other fattening foods.  - get a least 150-300 minutes of aerobic exercise per week.   -We have ordered labs or studies at this visit. It can take up to 1-2 weeks for results and processing. We will contact you with instructions IF your results are abnormal. Normal results will be [facility]. If you have not heard from us or can not find your results in [facility] in 2 weeks please contact our office.           No Follow-up on file.  Kriste BasqueKIM,  Jariel Drost R.

## 2015-09-19 NOTE — Patient Instructions (Signed)
BEFORE YOU LEAVE: -labs -follow up appointment in 4-6 months  Try flonase (available over the counter) daily for 1 month when allergies are bad - along with your claritin.  Vit D3 [phone] IU daily  Follow up with your gastroenterologist regarding your next colonosocpy.  We recommend the following healthy lifestyle measures: - eat a healthy whole foods diet consisting of regular small meals composed of vegetables, fruits, beans, nuts, seeds, healthy meats such as white chicken and fish and whole grains.  - avoid sweets, white starchy foods, fried foods, fast food, processed foods, sodas, red meet and other fattening foods.  - get a least 150-300 minutes of aerobic exercise per week.   -We have ordered labs or studies at this visit. It can take up to 1-2 weeks for results and processing. We will contact you with instructions IF your results are abnormal. Normal results will be [facility] to your [facility]. If you have not heard from us or can not find your results in [facility] in 2 weeks please contact our office.

## 2015-09-20 LAB — HEPATITIS C ANTIBODY: HCV AB: NEGATIVE

## 2015-11-11 IMAGING — CR DG ANKLE COMPLETE 3+V*R*
3 series · 3 of 3 positions shown · non-contrast
Comparison: None.

CLINICAL DATA: Fell coming into hospital to visit father. Twisting
injury to the right ankle.

EXAM:
RIGHT ANKLE - COMPLETE 3+ VIEW

[x ankle ap right]
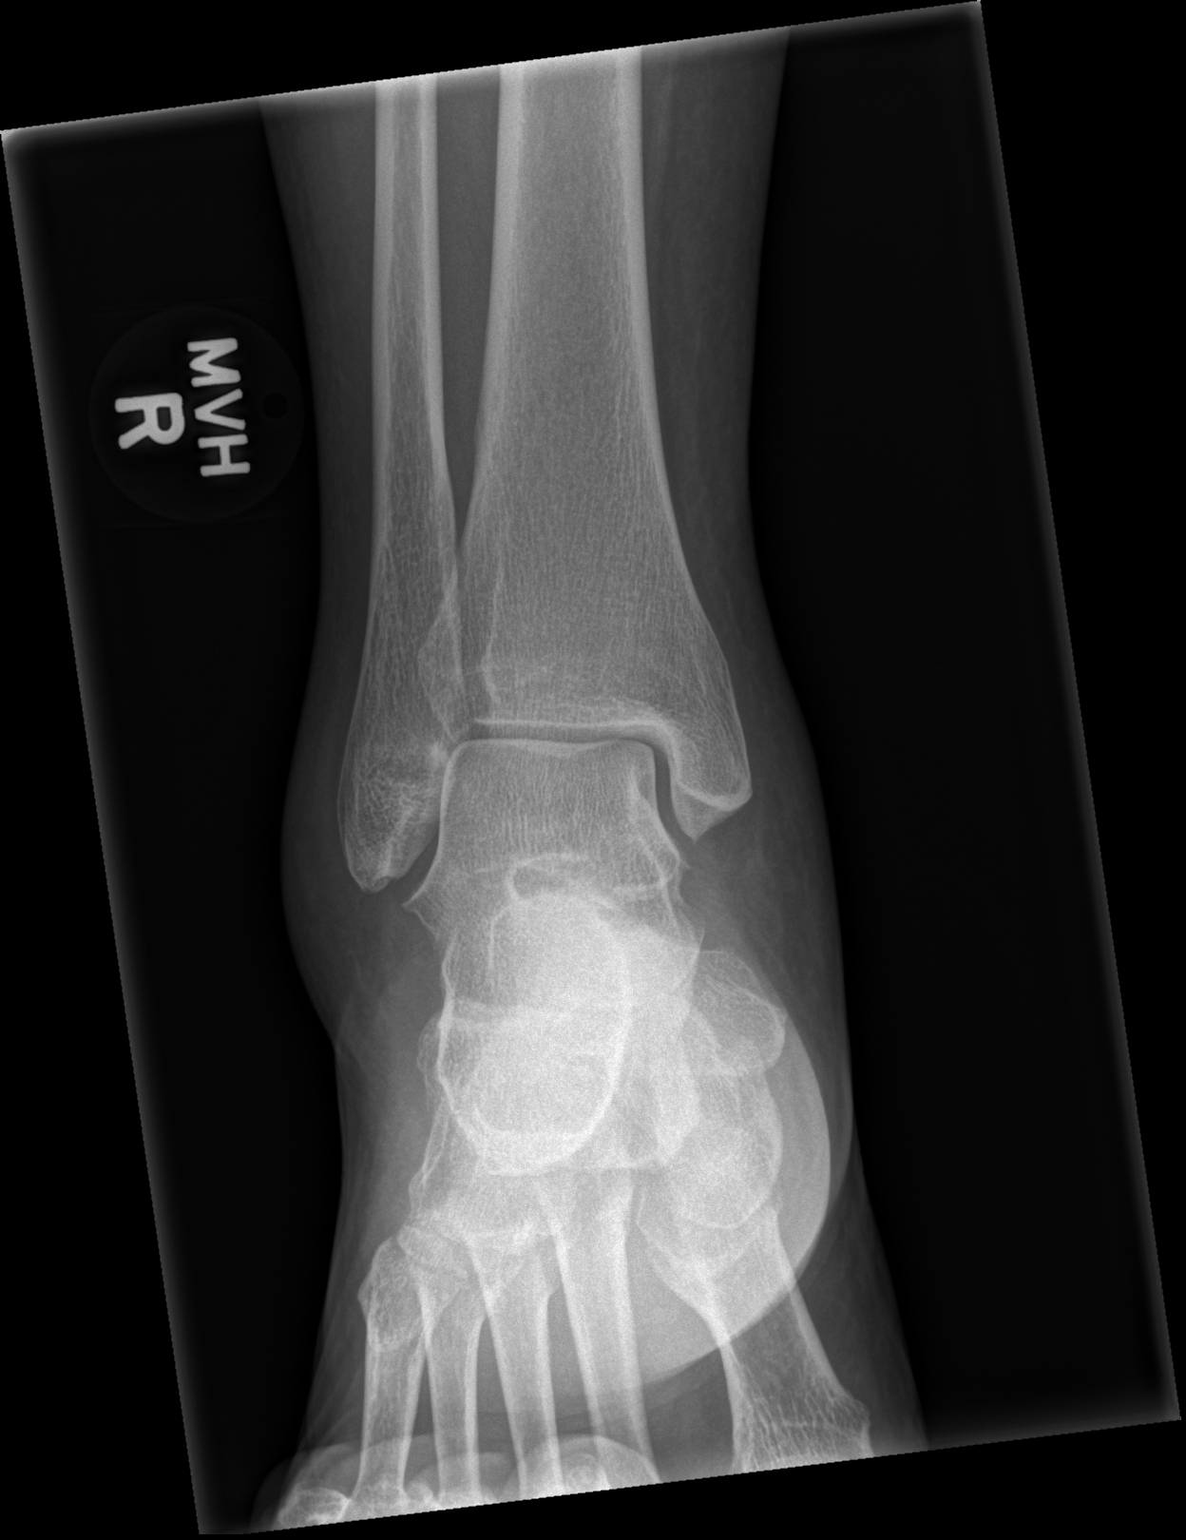

[x ankle obl right]
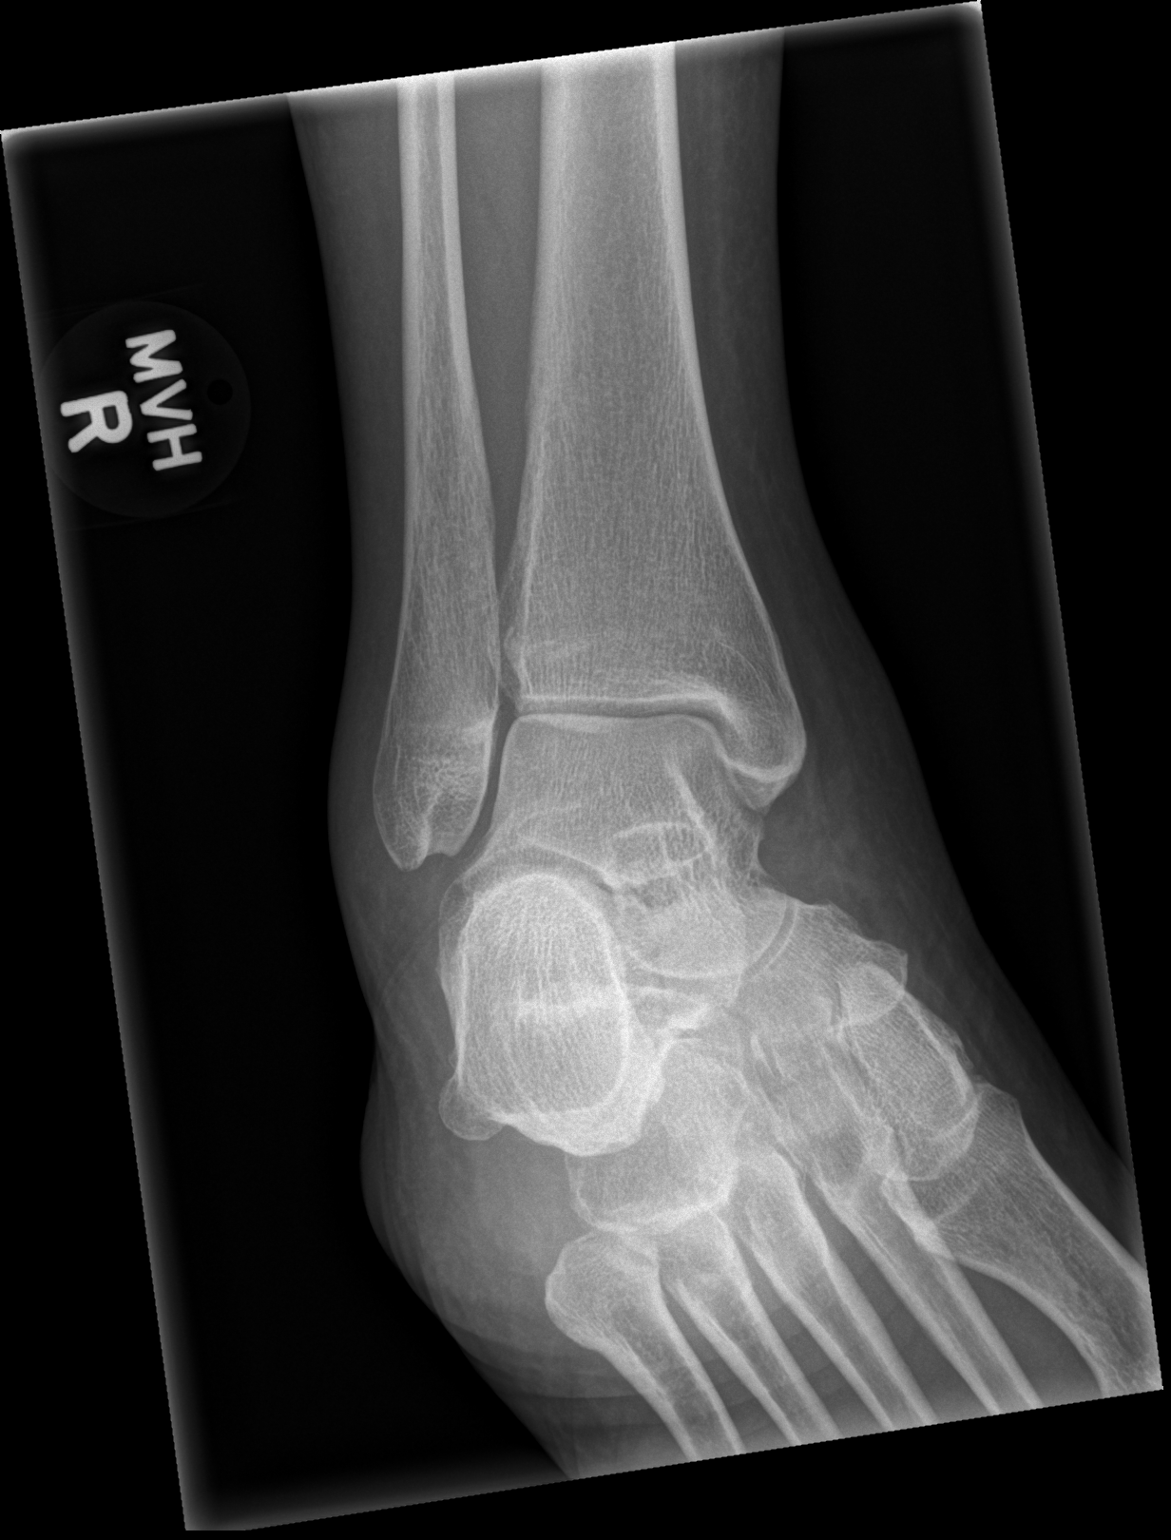

[x ankle lat right]
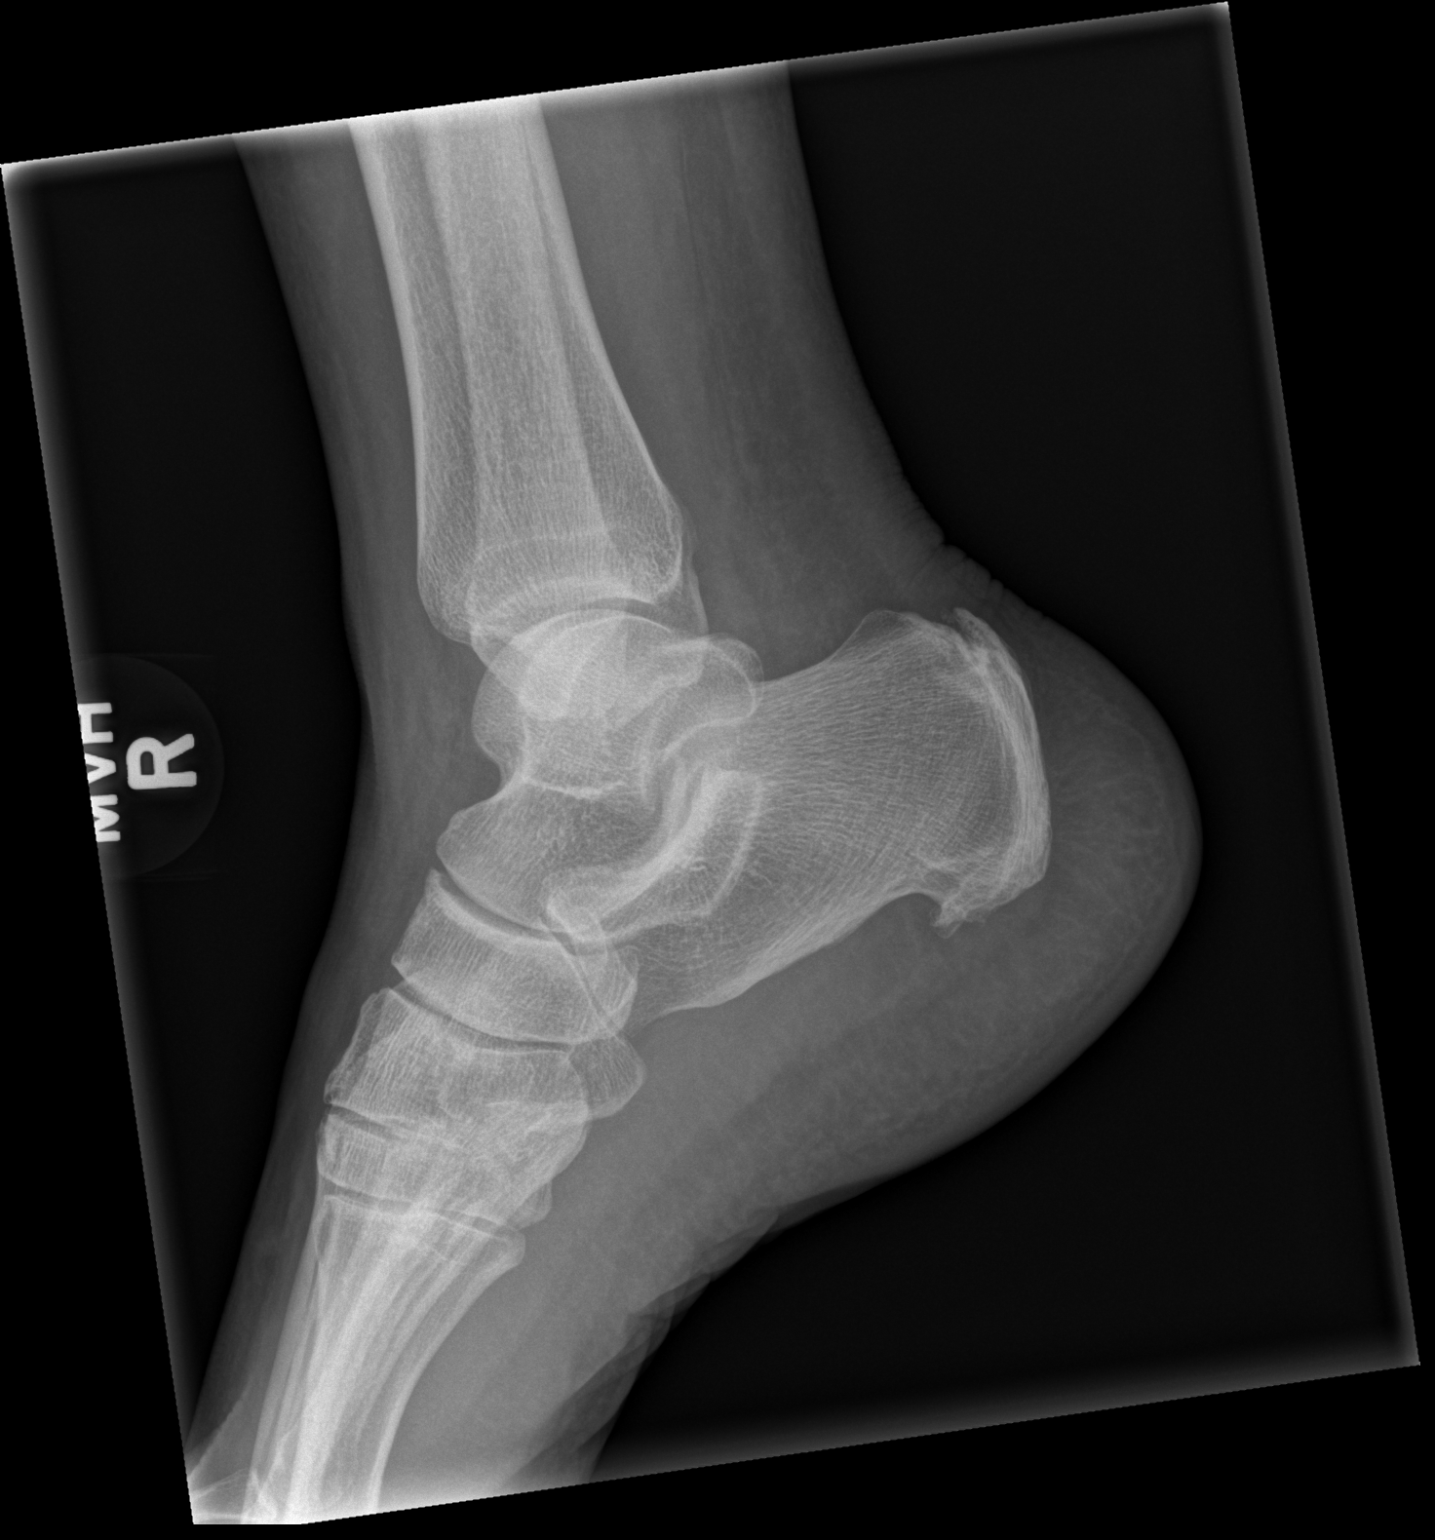

[3 of 3 positions shown; findings below may reference images not displayed]

FINDINGS: No fracture. Ankle mortise is normally spaced and aligned with no
arthropathic changes. There are dorsal plantar calcaneal spurs. Soft
tissue swelling is noted, predominating laterally.
IMPRESSION: No fracture or dislocation.

## 2016-03-19 ENCOUNTER — Ambulatory Visit: Payer: 59 | Admitting: Family Medicine

## 2016-07-23 ENCOUNTER — Encounter: Payer: Self-pay | Admitting: Gastroenterology

## 2016-08-05 ENCOUNTER — Encounter: Payer: Self-pay | Admitting: Gastroenterology

## 2016-12-28 LAB — POCT LIPID PANEL
HDL: 42
LDL: 105
TC: 174
TRG: 138

## 2016-12-28 LAB — GLUCOSE, POCT (MANUAL RESULT ENTRY): POC Glucose: 112 mg/dl — AB (ref 70–99)

## 2017-09-06 LAB — HM MAMMOGRAPHY

## 2017-09-10 LAB — HM PAP SMEAR

## 2017-09-24 NOTE — Progress Notes (Addendum)
HPI:  Here for CPE and follow up.  -Concerns and/or follow up today: Not seen in over 2 years. Chronic medical problems including Obesity, HLD, allergic rhinitis and hyperglycemia were reviewed and are stable for the most part. Denies CP, SOB, DOE, treatment intolerance or new symptoms. Admits that diet has been poor.  No regular exercise.  She admits she gets some stiffness and soreness in her knees after sitting for long periods of time.  She has complaint of chronic nasal congestion intermittently.  She has a history of seasonal allergies and is not taking anything for this.  She will get a stuffy nose and postnasal drip.  No fevers, sinus pain, shortness of breath or wheezing.  -Taking folic acid, vitamin D or calcium: Takes multivitamin -Diabetes and Dyslipidemia Screening: Fasting for labs today -Vaccines: see vaccine section EPIC -pap history: Reports sees Dr. Ulanda Edison yearly and is up-to-date on her Pap smear and mammogram on her gynecology office -FDLMP: see nursing notes -sexual activity: yes, female partner, no new partners -wants STI testing (Hep C if born 80-65): no -FH breast, colon or ovarian ca: see FH Last mammogram: last mammo in chart 06/2015 Last colon cancer screening: colonoscopy done 09/2006 w/ hemorrhoids -she agrees to call to schedule her colonoscopy Breast Ca Risk Assessment: see family history and pt history DEXA (>/= 65): n/a  -Alcohol, Tobacco, drug use: see social history  Review of Systems - no fevers, unintentional weight loss, vision loss, hearing loss, chest pain, sob, hemoptysis, melena, hematochezia, hematuria, genital discharge, changing or concerning skin lesions, bleeding, bruising, loc, thoughts of self harm or SI  Past Medical History:  Diagnosis Date  . Chronic maxillary sinusitis 12/15/2007   Qualifier: Diagnosis of  By: Arnoldo Morale MD, Balinda Quails   . Cough variant asthma 10/19/2008   Qualifier: Diagnosis of  By: Arnoldo Morale MD, Balinda Quails   . GERD  (gastroesophageal reflux disease)   . HEMORRHOIDS, INTERNAL 12/15/2007   Qualifier: Diagnosis of  By: Jimmye Norman, [name], Winfield Cunas   . HIATAL HERNIA 12/15/2007   Qualifier: Diagnosis of  By: Jimmye Norman, [name], Winfield Cunas   . Hypertension   . MAMMOGRAM, ABNORMAL 08/01/2008   Qualifier: Diagnosis of  By: Jimmye Norman [name], Onalee Hua DISEASE 12/15/2007   Qualifier: Diagnosis of  By: Jimmye Norman [name], Winfield Cunas     Past Surgical History:  Procedure Laterality Date  . CHOLECYSTECTOMY    . KNEE SURGERY  1994  . trauma to aerm in machine   1996    Family History  Problem Relation Age of Onset  . Diabetes Mother   . Hypertension Father     Social History   Socioeconomic History  . Marital status: Married    Spouse name: None  . Number of children: None  . Years of education: None  . Highest education level: None  Social Needs  . Financial resource strain: None  . Food insecurity - worry: None  . Food insecurity - inability: None  . Transportation needs - medical: None  . Transportation needs - non-medical: None  Occupational History  . None  Tobacco Use  . Smoking status: Never Smoker  . Smokeless tobacco: Never Used  Substance and Sexual Activity  . Alcohol use: No  . Drug use: No  . Sexual activity: None  Other Topics Concern  . 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  GENERAL: vitals reviewed and listed below, alert, oriented, appears well hydrated and in no acute distress  HEENT: head atraumatic, PERRLA, normal appearance of eyes, ears, nose and mouth. moist mucus  membranes.  NECK: supple, no masses or lymphadenopathy  LUNGS: clear to auscultation bilaterally, no rales, rhonchi or wheeze  CV: HRRR, no peripheral edema or cyanosis, normal pedal pulses  ABDOMEN: bowel sounds normal, soft, non tender to palpation, no masses, no rebound or guarding  GU/BREAST: Declined, does with GYN  SKIN: no rash or abnormal lesions  MS: normal gait, moves all extremities normally  NEURO: normal gait, speech and thought processing grossly intact, muscle tone grossly intact throughout  PSYCH: normal affect, pleasant and cooperative  ASSESSMENT AND PLAN:  Discussed the following assessment and plan:  PREVENTIVE EXAM: -Discussed and advised all [place] preventive services health task force level A and B recommendations for age, sex and risks. -Advised at least 150 minutes of exercise per week and a healthy diet with avoidance of (less then 1 serving per week) processed foods, white starches, red meat, fast foods and sweets and consisting of: * 5-9 servings of fresh fruits and vegetables (not corn or potatoes) *nuts and seeds, beans *olives and olive oil *lean meats such as fish and white chicken  *whole grains -refused flu shot -labs, studies and vaccines per orders this encounter  Class 2 severe obesity due to excess calories with serious comorbidity and body mass index (BMI) of 36.0 to 36.9 in adult (HCC) -lifestyle recs discussed at length, summarized below -follow up 3 months  Hypertension, unspecified type - Plan: Basic metabolic panel -discussed tx options and goals -she opted to start with lifestyle changes -follow up 3 months  Hyperlipidemia, unspecified hyperlipidemia type - Plan: Lipid panel Hyperglycemia - Plan: Hemoglobin A1c -labs -lifestyle recs  Allergic rhinitis, unspecified seasonality, unspecified trigger -add INS, zyrtec, follow up 3 months  Patient advised to return to clinic immediately if symptoms worsen or persist or new  concerns.  Patient Instructions  BEFORE YOU LEAVE: -labs -follow up: 3 months - HTN, allergies, BMI 67  CALL your gastroenterologist today to schedule your colonoscopy for colon cancer screening.  Zyrtec at night and flonase 2 sprays each nostril daily for 1 month, then 1 spray each nostril daily.  We have ordered labs or studies at this visit. It can take up to 1-2 weeks for results and processing. IF results require follow up or explanation, we will call you with instructions. Clinically stable results will be released to your [facility]. If you have not heard from [place] or cannot find your results in [facility] in 2 weeks please contact our office at [phone].  If you are not yet signed up for [facility], please consider signing up.   We recommend the following healthy lifestyle for LIFE: 1) Small portions. But, make sure to get regular (at least 3 per day), healthy meals and small healthy snacks if needed.  2) Eat a healthy clean diet.   TRY TO EAT: -at least 5-7 servings of low sugar, colorful,  and nutrient rich vegetables per day (not corn, potatoes or bananas.) -berries are the best choice if you wish to eat fruit (only eat small amounts if trying to reduce weight)  -lean meets (fish, white meat of chicken or [place]) -vegan proteins for some meals - beans or tofu, whole grains, nuts and seeds -Replace bad fats with good fats - good fats include: fish, nuts and seeds, canola oil, olive oil -small amounts of low fat or non fat dairy -small amounts of100 % whole grains - check the lables -drink plenty of water  AVOID: -SUGAR, sweets, anything with added sugar, corn syrup or sweeteners - must read labels as even foods advertised as "healthy" often are loaded with sugar -if you must have a sweetener, small amounts of stevia may be best -sweetened beverages and artificially sweetened beverages -simple starches (rice, bread, potatoes, pasta, chips, etc - small amounts of 100% whole grains  are ok) -red meat, pork, butter -fried foods, fast food, processed food, excessive dairy, eggs and coconut.  3)Get at least 150 minutes of sweaty aerobic exercise per week.  4)Reduce stress - consider counseling, meditation and relaxation to balance other aspects of your life.   Health Maintenance for Postmenopausal Women Menopause is a normal process in which your reproductive ability comes to an end. This process happens gradually over a span of months to years, usually between the ages of 6 and 49. Menopause is complete when you have missed 12 consecutive menstrual periods. It is important to talk with your health care provider about some of the most common conditions that affect postmenopausal women, such as heart disease, cancer, and bone loss (osteoporosis). Adopting a healthy lifestyle and getting preventive care can help to promote your health and wellness. Those actions can also lower your chances of developing some of these common conditions. What should I know about menopause? During menopause, you may experience a number of symptoms, such as:  Moderate-to-severe hot flashes.  Night sweats.  Decrease in sex drive.  Mood swings.  Headaches.  Tiredness.  Irritability.  Memory problems.  Insomnia.  Choosing to treat or not to treat menopausal changes is an individual decision that you make with your health care provider. What should I know about hormone replacement therapy and supplements? Hormone therapy products are effective for treating symptoms that are associated with menopause, such as hot flashes and night sweats. Hormone replacement carries certain risks, especially as you become older. If you are thinking about using estrogen or estrogen with progestin treatments, discuss the benefits and risks with your health care provider. What should I know about heart disease and stroke? Heart disease, heart attack, and stroke become more likely as you age. This may be  due, in part, to the hormonal changes that your body experiences during menopause. These can affect how your body processes dietary fats, triglycerides, and cholesterol. Heart attack and stroke are both medical emergencies. There are many things that you can do to help prevent heart disease and stroke:  Have your blood pressure checked at least every 1-2 years. High blood pressure causes heart disease and increases the risk of stroke.  If you are 53-82 years old, ask your health care provider if you should take aspirin to prevent a heart attack or a stroke.  Do not use any tobacco products, including cigarettes, chewing tobacco, or electronic cigarettes. If you need help quitting, ask your health care provider.  It is important to eat a healthy diet and maintain a healthy weight. ?  Be sure to include plenty of vegetables, fruits, low-fat dairy products, and lean protein. ? Avoid eating foods that are high in solid fats, added sugars, or salt (sodium).  Get regular exercise. This is one of the most important things that you can do for your health. ? Try to exercise for at least 150 minutes each week. The type of exercise that you do should increase your heart rate and make you sweat. This is known as moderate-intensity exercise. ? Try to do strengthening exercises at least twice each week. Do these in addition to the moderate-intensity exercise.  Know your numbers.Ask your health care provider to check your cholesterol and your blood glucose. Continue to have your blood tested as directed by your health care provider.  What should I know about cancer screening? There are several types of cancer. Take the following steps to reduce your risk and to catch any cancer development as early as possible. Breast Cancer  Practice breast self-awareness. ? This means understanding how your breasts normally appear and feel. ? It also means doing regular breast self-exams. Let your health care provider  know about any changes, no matter how small.  If you are 90 or older, have a clinician do a breast exam (clinical breast exam or CBE) every year. Depending on your age, family history, and medical history, it may be recommended that you also have a yearly breast X-ray (mammogram).  If you have a family history of breast cancer, talk with your health care provider about genetic screening.  If you are at high risk for breast cancer, talk with your health care provider about having an MRI and a mammogram every year.  Breast cancer (BRCA) gene test is recommended for women who have family members with BRCA-related cancers. Results of the assessment will determine the need for genetic counseling and BRCA1 and for BRCA2 testing. BRCA-related cancers include these types: ? Breast. This occurs in males or females. ? Ovarian. ? Tubal. This may also be called fallopian tube cancer. ? Cancer of the abdominal or pelvic lining (peritoneal cancer). ? Prostate. ? Pancreatic.  Cervical, Uterine, and Ovarian Cancer Your health care provider may recommend that you be screened regularly for cancer of the pelvic organs. These include your ovaries, uterus, and vagina. This screening involves a pelvic exam, which includes checking for microscopic changes to the surface of your cervix (Pap test).  For women ages 21-65, health care providers may recommend a pelvic exam and a Pap test every three years. For women ages 49-65, they may recommend the Pap test and pelvic exam, combined with testing for human papilloma virus (HPV), every five years. Some types of HPV increase your risk of cervical cancer. Testing for HPV may also be done on women of any age who have unclear Pap test results.  Other health care providers may not recommend any screening for nonpregnant women who are considered low risk for pelvic cancer and have no symptoms. Ask your health care provider if a screening pelvic exam is right for you.  If you  have had past treatment for cervical cancer or a condition that could lead to cancer, you need Pap tests and screening for cancer for at least 20 years after your treatment. If Pap tests have been discontinued for you, your risk factors (such as having a new sexual partner) need to be reassessed to determine if you should start having screenings again. Some women have medical problems that increase the chance of getting cervical cancer.  In these cases, your health care provider may recommend that you have screening and Pap tests more often.  If you have a family history of uterine cancer or ovarian cancer, talk with your health care provider about genetic screening.  If you have vaginal bleeding after reaching menopause, tell your health care provider.  There are currently no reliable tests available to screen for ovarian cancer.  Lung Cancer Lung cancer screening is recommended for adults 92-65 years old who are at high risk for lung cancer because of a history of smoking. A yearly low-dose CT scan of the lungs is recommended if you:  Currently smoke.  Have a history of at least 30 pack-years of smoking and you currently smoke or have quit within the past 15 years. A pack-year is smoking an average of one pack of cigarettes per day for one year.  Yearly screening should:  Continue until it has been 15 years since you quit.  Stop if you develop a health problem that would prevent you from having lung cancer treatment.  Colorectal Cancer  This type of cancer can be detected and can often be prevented.  Routine colorectal cancer screening usually begins at age 33 and continues through age 39.  If you have risk factors for colon cancer, your health care provider may recommend that you be screened at an earlier age.  If you have a family history of colorectal cancer, talk with your health care provider about genetic screening.  Your health care provider may also recommend using home test  kits to check for hidden blood in your stool.  A small camera at the end of a tube can be used to examine your colon directly (sigmoidoscopy or colonoscopy). This is done to check for the earliest forms of colorectal cancer.  Direct examination of the colon should be repeated every 5-10 years until age 2. However, if early forms of precancerous polyps or small growths are found or if you have a family history or genetic risk for colorectal cancer, you may need to be screened more often.  Skin Cancer  Check your skin from head to toe regularly.  Monitor any moles. Be sure to tell your health care provider: ? About any new moles or changes in moles, especially if there is a change in a mole's shape or color. ? If you have a mole that is larger than the size of a pencil eraser.  If any of your family members has a history of skin cancer, especially at a young age, talk with your health care provider about genetic screening.  Always use sunscreen. Apply sunscreen liberally and repeatedly throughout the day.  Whenever you are outside, protect yourself by wearing long sleeves, pants, a wide-brimmed hat, and sunglasses.  What should I know about osteoporosis? Osteoporosis is a condition in which bone destruction happens more quickly than new bone creation. After menopause, you may be at an increased risk for osteoporosis. To help prevent osteoporosis or the bone fractures that can happen because of osteoporosis, the following is recommended:  If you are 12-8 years old, get at least 1,000 mg of calcium and at least 600 mg of vitamin D per day.  If you are older than age 38 but younger than age 37, get at least 1,200 mg of calcium and at least 600 mg of vitamin D per day.  If you are older than age 35, get at least 1,200 mg of calcium and at least 800 mg of vitamin D  per day.  Smoking and excessive alcohol intake increase the risk of osteoporosis. Eat foods that are rich in calcium and vitamin  D, and do weight-bearing exercises several times each week as directed by your health care provider. What should I know about how menopause affects my mental health? Depression may occur at any age, but it is more common as you become older. Common symptoms of depression include:  Low or sad mood.  Changes in sleep patterns.  Changes in appetite or eating patterns.  Feeling an overall lack of motivation or enjoyment of activities that you previously enjoyed.  Frequent crying spells.  Talk with your health care provider if you think that you are experiencing depression. What should I know about immunizations? It is important that you get and maintain your immunizations. These include:  Tetanus, diphtheria, and pertussis (Tdap) booster vaccine.  Influenza every year before the flu season begins.  Pneumonia vaccine.  Shingles vaccine.  Your health care provider may also recommend other immunizations. This information is not intended to replace advice given to you by your health care provider. Make sure you discuss any questions you have with your health care provider. Document Released: 11/01/2005 Document Revised: 03/29/2016 Document Reviewed: 06/13/2015 Elsevier Interactive Patient Education  2018 [employer].            No Follow-up on file.  Colin Benton R., DO

## 2017-09-25 ENCOUNTER — Ambulatory Visit (INDEPENDENT_AMBULATORY_CARE_PROVIDER_SITE_OTHER): Payer: 59 | Admitting: Family Medicine

## 2017-09-25 ENCOUNTER — Encounter: Payer: Self-pay | Admitting: Family Medicine

## 2017-09-25 VITALS — BP 138/82 | HR 76 | Temp 98.4°F | Ht 67.0 in | Wt 230.0 lb

## 2017-09-25 DIAGNOSIS — J309 Allergic rhinitis, unspecified: Secondary | ICD-10-CM

## 2017-09-25 DIAGNOSIS — Z6836 Body mass index (BMI) 36.0-36.9, adult: Secondary | ICD-10-CM | POA: Diagnosis not present

## 2017-09-25 DIAGNOSIS — Z0000 Encounter for general adult medical examination without abnormal findings: Secondary | ICD-10-CM | POA: Diagnosis not present

## 2017-09-25 DIAGNOSIS — E66812 Obesity, class 2: Secondary | ICD-10-CM

## 2017-09-25 DIAGNOSIS — I10 Essential (primary) hypertension: Secondary | ICD-10-CM

## 2017-09-25 DIAGNOSIS — R739 Hyperglycemia, unspecified: Secondary | ICD-10-CM

## 2017-09-25 DIAGNOSIS — E785 Hyperlipidemia, unspecified: Secondary | ICD-10-CM | POA: Diagnosis not present

## 2017-09-25 LAB — LIPID PANEL
Cholesterol: 190 mg/dL (ref 0–200)
HDL: 34.2 mg/dL — AB (ref >39.00)
LDL Cholesterol: 125 mg/dL — ABNORMAL HIGH (ref 0–99)
NONHDL: 155.79
Total CHOL/HDL Ratio: 6
Triglycerides: 156 mg/dL — ABNORMAL HIGH (ref 0.0–149.0)
VLDL: 31.2 mg/dL (ref 0.0–40.0)

## 2017-09-25 LAB — BASIC METABOLIC PANEL WITH GFR
BUN: 13 mg/dL (ref 6–23)
CO2: 29 meq/L (ref 19–32)
Calcium: 9.2 mg/dL (ref 8.4–10.5)
Chloride: 107 meq/L (ref 96–112)
Creatinine, Ser: 0.88 mg/dL (ref 0.40–1.20)
GFR: 83.9 mL/min
Glucose, Bld: 106 mg/dL — ABNORMAL HIGH (ref 70–99)
Potassium: 4.2 meq/L (ref 3.5–5.1)
Sodium: 143 meq/L (ref 135–145)

## 2017-09-25 LAB — HEMOGLOBIN A1C: Hgb A1c MFr Bld: 6.2 % (ref 4.6–6.5)

## 2017-09-25 NOTE — Patient Instructions (Addendum)
BEFORE YOU LEAVE: -labs -follow up: 3 months - HTN, allergies, BMI 26  CALL your gastroenterologist today to schedule your colonoscopy for colon cancer screening.  Zyrtec at night and flonase 2 sprays each nostril daily for 1 month, then 1 spray each nostril daily.  We have ordered labs or studies at this visit. It can take up to 1-2 weeks for results and processing. IF results require follow up or explanation, we will call you with instructions. Clinically stable results will be released to your [facility]. If you have not heard from [place] or cannot find your results in [facility] in 2 weeks please contact our office at [phone].  If you are not yet signed up for [facility], please consider signing up.   We recommend the following healthy lifestyle for LIFE: 1) Small portions. But, make sure to get regular (at least 3 per day), healthy meals and small healthy snacks if needed.  2) Eat a healthy clean diet.   TRY TO EAT: -at least 5-7 servings of low sugar, colorful, and nutrient rich vegetables per day (not corn, potatoes or bananas.) -berries are the best choice if you wish to eat fruit (only eat small amounts if trying to reduce weight)  -lean meets (fish, white meat of chicken or [place]) -vegan proteins for some meals - beans or tofu, whole grains, nuts and seeds -Replace bad fats with good fats - good fats include: fish, nuts and seeds, canola oil, olive oil -small amounts of low fat or non fat dairy -small amounts of100 % whole grains - check the lables -drink plenty of water  AVOID: -SUGAR, sweets, anything with added sugar, corn syrup or sweeteners - must read labels as even foods advertised as "healthy" often are loaded with sugar -if you must have a sweetener, small amounts of stevia may be best -sweetened beverages and artificially sweetened beverages -simple starches (rice, bread, potatoes, pasta, chips, etc - small amounts of 100% whole grains are ok) -red meat, pork,  butter -fried foods, fast food, processed food, excessive dairy, eggs and coconut.  3)Get at least 150 minutes of sweaty aerobic exercise per week.  4)Reduce stress - consider counseling, meditation and relaxation to balance other aspects of your life.   Health Maintenance for Postmenopausal Women Menopause is a normal process in which your reproductive ability comes to an end. This process happens gradually over a span of months to years, usually between the ages of 59 and 36. Menopause is complete when you have missed 12 consecutive menstrual periods. It is important to talk with your health care provider about some of the most common conditions that affect postmenopausal women, such as heart disease, cancer, and bone loss (osteoporosis). Adopting a healthy lifestyle and getting preventive care can help to promote your health and wellness. Those actions can also lower your chances of developing some of these common conditions. What should I know about menopause? During menopause, you may experience a number of symptoms, such as:  Moderate-to-severe hot flashes.  Night sweats.  Decrease in sex drive.  Mood swings.  Headaches.  Tiredness.  Irritability.  Memory problems.  Insomnia.  Choosing to treat or not to treat menopausal changes is an individual decision that you make with your health care provider. What should I know about hormone replacement therapy and supplements? Hormone therapy products are effective for treating symptoms that are associated with menopause, such as hot flashes and night sweats. Hormone replacement carries certain risks, especially as you become older. If you are  thinking about using estrogen or estrogen with progestin treatments, discuss the benefits and risks with your health care provider. What should I know about heart disease and stroke? Heart disease, heart attack, and stroke become more likely as you age. This may be due, in part, to the hormonal  changes that your body experiences during menopause. These can affect how your body processes dietary fats, triglycerides, and cholesterol. Heart attack and stroke are both medical emergencies. There are many things that you can do to help prevent heart disease and stroke:  Have your blood pressure checked at least every 1-2 years. High blood pressure causes heart disease and increases the risk of stroke.  If you are 16-19 years old, ask your health care provider if you should take aspirin to prevent a heart attack or a stroke.  Do not use any tobacco products, including cigarettes, chewing tobacco, or electronic cigarettes. If you need help quitting, ask your health care provider.  It is important to eat a healthy diet and maintain a healthy weight. ? Be sure to include plenty of vegetables, fruits, low-fat dairy products, and lean protein. ? Avoid eating foods that are high in solid fats, added sugars, or salt (sodium).  Get regular exercise. This is one of the most important things that you can do for your health. ? Try to exercise for at least 150 minutes each week. The type of exercise that you do should increase your heart rate and make you sweat. This is known as moderate-intensity exercise. ? Try to do strengthening exercises at least twice each week. Do these in addition to the moderate-intensity exercise.  Know your numbers.Ask your health care provider to check your cholesterol and your blood glucose. Continue to have your blood tested as directed by your health care provider.  What should I know about cancer screening? There are several types of cancer. Take the following steps to reduce your risk and to catch any cancer development as early as possible. Breast Cancer  Practice breast self-awareness. ? This means understanding how your breasts normally appear and feel. ? It also means doing regular breast self-exams. Let your health care provider know about any changes, no  matter how small.  If you are 80 or older, have a clinician do a breast exam (clinical breast exam or CBE) every year. Depending on your age, family history, and medical history, it may be recommended that you also have a yearly breast X-ray (mammogram).  If you have a family history of breast cancer, talk with your health care provider about genetic screening.  If you are at high risk for breast cancer, talk with your health care provider about having an MRI and a mammogram every year.  Breast cancer (BRCA) gene test is recommended for women who have family members with BRCA-related cancers. Results of the assessment will determine the need for genetic counseling and BRCA1 and for BRCA2 testing. BRCA-related cancers include these types: ? Breast. This occurs in males or females. ? Ovarian. ? Tubal. This may also be called fallopian tube cancer. ? Cancer of the abdominal or pelvic lining (peritoneal cancer). ? Prostate. ? Pancreatic.  Cervical, Uterine, and Ovarian Cancer Your health care provider may recommend that you be screened regularly for cancer of the pelvic organs. These include your ovaries, uterus, and vagina. This screening involves a pelvic exam, which includes checking for microscopic changes to the surface of your cervix (Pap test).  For women ages 21-65, health care providers may recommend a  pelvic exam and a Pap test every three years. For women ages 73-65, they may recommend the Pap test and pelvic exam, combined with testing for human papilloma virus (HPV), every five years. Some types of HPV increase your risk of cervical cancer. Testing for HPV may also be done on women of any age who have unclear Pap test results.  Other health care providers may not recommend any screening for nonpregnant women who are considered low risk for pelvic cancer and have no symptoms. Ask your health care provider if a screening pelvic exam is right for you.  If you have had past treatment for  cervical cancer or a condition that could lead to cancer, you need Pap tests and screening for cancer for at least 20 years after your treatment. If Pap tests have been discontinued for you, your risk factors (such as having a new sexual partner) need to be reassessed to determine if you should start having screenings again. Some women have medical problems that increase the chance of getting cervical cancer. In these cases, your health care provider may recommend that you have screening and Pap tests more often.  If you have a family history of uterine cancer or ovarian cancer, talk with your health care provider about genetic screening.  If you have vaginal bleeding after reaching menopause, tell your health care provider.  There are currently no reliable tests available to screen for ovarian cancer.  Lung Cancer Lung cancer screening is recommended for adults 69-15 years old who are at high risk for lung cancer because of a history of smoking. A yearly low-dose CT scan of the lungs is recommended if you:  Currently smoke.  Have a history of at least 30 pack-years of smoking and you currently smoke or have quit within the past 15 years. A pack-year is smoking an average of one pack of cigarettes per day for one year.  Yearly screening should:  Continue until it has been 15 years since you quit.  Stop if you develop a health problem that would prevent you from having lung cancer treatment.  Colorectal Cancer  This type of cancer can be detected and can often be prevented.  Routine colorectal cancer screening usually begins at age 60 and continues through age 44.  If you have risk factors for colon cancer, your health care provider may recommend that you be screened at an earlier age.  If you have a family history of colorectal cancer, talk with your health care provider about genetic screening.  Your health care provider may also recommend using home test kits to check for hidden  blood in your stool.  A small camera at the end of a tube can be used to examine your colon directly (sigmoidoscopy or colonoscopy). This is done to check for the earliest forms of colorectal cancer.  Direct examination of the colon should be repeated every 5-10 years until age 44. However, if early forms of precancerous polyps or small growths are found or if you have a family history or genetic risk for colorectal cancer, you may need to be screened more often.  Skin Cancer  Check your skin from head to toe regularly.  Monitor any moles. Be sure to tell your health care provider: ? About any new moles or changes in moles, especially if there is a change in a mole's shape or color. ? If you have a mole that is larger than the size of a pencil eraser.  If any of your  family members has a history of skin cancer, especially at a young age, talk with your health care provider about genetic screening.  Always use sunscreen. Apply sunscreen liberally and repeatedly throughout the day.  Whenever you are outside, protect yourself by wearing long sleeves, pants, a wide-brimmed hat, and sunglasses.  What should I know about osteoporosis? Osteoporosis is a condition in which bone destruction happens more quickly than new bone creation. After menopause, you may be at an increased risk for osteoporosis. To help prevent osteoporosis or the bone fractures that can happen because of osteoporosis, the following is recommended:  If you are 103-68 years old, get at least 1,000 mg of calcium and at least 600 mg of vitamin D per day.  If you are older than age 40 but younger than age 29, get at least 1,200 mg of calcium and at least 600 mg of vitamin D per day.  If you are older than age 42, get at least 1,200 mg of calcium and at least 800 mg of vitamin D per day.  Smoking and excessive alcohol intake increase the risk of osteoporosis. Eat foods that are rich in calcium and vitamin D, and do weight-bearing  exercises several times each week as directed by your health care provider. What should I know about how menopause affects my mental health? Depression may occur at any age, but it is more common as you become older. Common symptoms of depression include:  Low or sad mood.  Changes in sleep patterns.  Changes in appetite or eating patterns.  Feeling an overall lack of motivation or enjoyment of activities that you previously enjoyed.  Frequent crying spells.  Talk with your health care provider if you think that you are experiencing depression. What should I know about immunizations? It is important that you get and maintain your immunizations. These include:  Tetanus, diphtheria, and pertussis (Tdap) booster vaccine.  Influenza every year before the flu season begins.  Pneumonia vaccine.  Shingles vaccine.  Your health care provider may also recommend other immunizations. This information is not intended to replace advice given to you by your health care provider. Make sure you discuss any questions you have with your health care provider. Document Released: 11/01/2005 Document Revised: 03/29/2016 Document Reviewed: 06/13/2015 Elsevier Interactive Patient Education  2018 [employer].

## 2017-10-17 ENCOUNTER — Encounter: Payer: Self-pay | Admitting: Family Medicine

## 2017-12-01 ENCOUNTER — Other Ambulatory Visit (INDEPENDENT_AMBULATORY_CARE_PROVIDER_SITE_OTHER): Payer: 59

## 2017-12-01 DIAGNOSIS — E785 Hyperlipidemia, unspecified: Secondary | ICD-10-CM

## 2017-12-01 LAB — LIPID PANEL
CHOL/HDL RATIO: 5
Cholesterol: 168 mg/dL (ref 0–200)
HDL: 36.6 mg/dL — ABNORMAL LOW (ref >39.00)
LDL Cholesterol: 112 mg/dL — ABNORMAL HIGH (ref 0–99)
NonHDL: 130.97
TRIGLYCERIDES: 96 mg/dL (ref 0.0–149.0)
VLDL: 19.2 mg/dL (ref 0.0–40.0)

## 2017-12-03 NOTE — Progress Notes (Signed)
HPI:  Using dictation device. Unfortunately this device frequently misinterprets words/phrases.  Holly Arellano is a pleasant 62 y.o. here for follow up. Chronic medical problems summarized below were reviewed for changes and stability and were updated as needed below. These issues and their treatment remain stable for the most part.  Reports she has made a lot of changes to her lifestyle.  She has decreased sugar and has stopped eating out.  She is doing more cooking at home.  She is trying to eat more whole foods.  She has increased her water intake and has increased her exercise to 3 days/week.  She reports she feels a lot better.  Occasional pain in her knee, but doing well overall.  She continues to prefer to treat her elevated blood pressure and cholesterol with lifestyle changes rather than a medication.Denies CP, SOB, DOE, treatment intolerance or new symptoms. She recently is undergoing some dental work, was on antibiotics and is going to have a tooth pulled. Last CPE 09/25/17  Elevated Blood Pressure: -she wanted to try lifestyle treatment first  Chronic PND/nasal congestion/allergic rhinitis: -trial zyrtec advised at last visit  Hyperlipidemia/Obesity/Hyperglycemia: -lifestyle changes encouraged and advised -lipid check 11/2017 did show some improvement in cholesterol (ratio still high at 5) -Wt  230 (1/19) --> 222 (3/19)  ROS: See pertinent positives and negatives per HPI.  Past Medical History:  Diagnosis Date  . Chronic maxillary sinusitis 12/15/2007   Qualifier: Diagnosis of  By: Lovell Sheehan MD, Balinda Quails   . Cough variant asthma 10/19/2008   Qualifier: Diagnosis of  By: Lovell Sheehan MD, Balinda Quails   . GERD (gastroesophageal reflux disease)   . HEMORRHOIDS, INTERNAL 12/15/2007   Qualifier: Diagnosis of  By: Mayford Knife, [name], Domenic Polite   . HIATAL HERNIA 12/15/2007   Qualifier: Diagnosis of  By: Mayford Knife, [name], Domenic Polite   . Hypertension   . MAMMOGRAM, ABNORMAL 08/01/2008   Qualifier: Diagnosis  of  By: Mayford Knife [name], Ronny Bacon DISEASE 12/15/2007   Qualifier: Diagnosis of  By: Mayford Knife [name], Domenic Polite     Past Surgical History:  Procedure Laterality Date  . CHOLECYSTECTOMY    . KNEE SURGERY  1994  . trauma to aerm in machine   1996    Family History  Problem Relation Age of Onset  . Diabetes Mother   . Hypertension Father     SOCIAL HX: No reported changes except for improvement in diet and exercise   Current Outpatient Medications:  Marland Kitchen  Multiple Vitamin (MULTIVITAMIN) capsule, Take 1 capsule by mouth daily., Disp: , Rfl:   EXAM:  Vitals:   12/04/17 0910  BP: 120/80  Pulse: 71  Temp: 98.3 F (36.8 C)    Body mass index is 34.91 kg/m.  GENERAL: vitals reviewed and listed above, alert, oriented, appears well hydrated and in no acute distress  HEENT: atraumatic, conjunttiva clear, no obvious abnormalities on inspection of external nose and ears  NECK: no obvious masses on inspection  LUNGS: clear to auscultation bilaterally, no wheezes, rales or rhonchi, good air movement  CV: HRRR, no peripheral edema  MS: moves all extremities without noticeable abnormality  PSYCH: pleasant and cooperative, no obvious depression or anxiety  ASSESSMENT AND PLAN:  Discussed the following assessment and plan:  BMI 34.0-34.9,adult  Hyperlipidemia, unspecified hyperlipidemia type  Hyperglycemia  Chronic pain of right knee  -Doing well, congratulated her on significant changes in the lifestyle and encouraged her to continue on lifelong healthy low sugar diet and  regular exercise.  I am so proud of her and hope she can continue this long-term.  She has had a significant reduction in her weight. -We talked at length about healthy choices in the diet -Offered to evaluate the knee, she declined, she wants to continue working on some exercises this is improving and let us know if it does not resolve -We discussed her lab results, she continues to decline  medication, discussed risk and benefits of medications, also risk is lower with a healthy lifestyle -Follow-up in about 3-4 months with fasting labs then to recheck  Patient Instructions  BEFORE YOU LEAVE: -follow up: 3 months, come fasting  Keep up the great changes and a healthy lifestyle!!   We recommend the following healthy lifestyle for LIFE: 1) Small portions. But, make sure to get regular (at least 3 per day), healthy meals and small healthy snacks if needed.  2) Eat a healthy clean diet.   TRY TO EAT: -at least 5-7 servings of low sugar, colorful, and nutrient rich vegetables per day (not corn, potatoes or bananas.) -berries are the best choice if you wish to eat fruit (only eat small amounts if trying to reduce weight)  -lean meets (fish, white meat of chicken or [place]) -vegan proteins for some meals - beans or tofu, whole grains, nuts and seeds -Replace bad fats with good fats - good fats include: fish, nuts and seeds, canola oil, olive oil -small amounts of low fat or non fat dairy -small amounts of100 % whole grains - check the lables -drink plenty of water  AVOID: -SUGAR, sweets, anything with added sugar, corn syrup or sweeteners - must read labels as even foods advertised as "healthy" often are loaded with sugar -if you must have a sweetener, small amounts of stevia may be best -sweetened beverages and artificially sweetened beverages -simple starches (rice, bread, potatoes, pasta, chips, etc - small amounts of 100% whole grains are ok) -red meat, pork, butter -fried foods, fast food, processed food, excessive dairy, eggs and coconut.  3)Get at least 150 minutes of sweaty aerobic exercise per week.  4)Reduce stress - consider counseling, meditation and relaxation to balance other aspects of your life.     Terressa KoyanagiHannah R Zonia Caplin, DO

## 2017-12-04 ENCOUNTER — Encounter: Payer: Self-pay | Admitting: Family Medicine

## 2017-12-04 ENCOUNTER — Ambulatory Visit: Payer: 59 | Admitting: Family Medicine

## 2017-12-04 VITALS — BP 120/80 | HR 71 | Temp 98.3°F | Ht 67.0 in | Wt 222.9 lb

## 2017-12-04 DIAGNOSIS — E785 Hyperlipidemia, unspecified: Secondary | ICD-10-CM

## 2017-12-04 DIAGNOSIS — R739 Hyperglycemia, unspecified: Secondary | ICD-10-CM | POA: Diagnosis not present

## 2017-12-04 DIAGNOSIS — G8929 Other chronic pain: Secondary | ICD-10-CM | POA: Diagnosis not present

## 2017-12-04 DIAGNOSIS — M25561 Pain in right knee: Secondary | ICD-10-CM | POA: Diagnosis not present

## 2017-12-04 DIAGNOSIS — Z6834 Body mass index (BMI) 34.0-34.9, adult: Secondary | ICD-10-CM | POA: Diagnosis not present

## 2017-12-04 NOTE — Patient Instructions (Signed)
BEFORE YOU LEAVE: -follow up: 3 months, come fasting  Keep up the great changes and a healthy lifestyle!!   We recommend the following healthy lifestyle for LIFE: 1) Small portions. But, make sure to get regular (at least 3 per day), healthy meals and small healthy snacks if needed.  2) Eat a healthy clean diet.   TRY TO EAT: -at least 5-7 servings of low sugar, colorful, and nutrient rich vegetables per day (not corn, potatoes or bananas.) -berries are the best choice if you wish to eat fruit (only eat small amounts if trying to reduce weight)  -lean meets (fish, white meat of chicken or [place]) -vegan proteins for some meals - beans or tofu, whole grains, nuts and seeds -Replace bad fats with good fats - good fats include: fish, nuts and seeds, canola oil, olive oil -small amounts of low fat or non fat dairy -small amounts of100 % whole grains - check the lables -drink plenty of water  AVOID: -SUGAR, sweets, anything with added sugar, corn syrup or sweeteners - must read labels as even foods advertised as "healthy" often are loaded with sugar -if you must have a sweetener, small amounts of stevia may be best -sweetened beverages and artificially sweetened beverages -simple starches (rice, bread, potatoes, pasta, chips, etc - small amounts of 100% whole grains are ok) -red meat, pork, butter -fried foods, fast food, processed food, excessive dairy, eggs and coconut.  3)Get at least 150 minutes of sweaty aerobic exercise per week.  4)Reduce stress - consider counseling, meditation and relaxation to balance other aspects of your life.

## 2017-12-25 ENCOUNTER — Ambulatory Visit: Payer: 59 | Admitting: Family Medicine

## 2018-03-09 ENCOUNTER — Ambulatory Visit: Payer: 59 | Admitting: Family Medicine

## 2018-03-17 ENCOUNTER — Ambulatory Visit: Payer: 59 | Admitting: Family Medicine

## 2018-03-24 ENCOUNTER — Encounter: Payer: Self-pay | Admitting: Family Medicine

## 2018-06-30 ENCOUNTER — Ambulatory Visit: Payer: 59 | Admitting: Family Medicine

## 2018-06-30 ENCOUNTER — Encounter: Payer: Self-pay | Admitting: Family Medicine

## 2018-06-30 VITALS — BP 122/84 | HR 81 | Temp 98.7°F | Ht 67.0 in | Wt 234.5 lb

## 2018-06-30 DIAGNOSIS — R05 Cough: Secondary | ICD-10-CM

## 2018-06-30 DIAGNOSIS — J989 Respiratory disorder, unspecified: Secondary | ICD-10-CM

## 2018-06-30 DIAGNOSIS — R059 Cough, unspecified: Secondary | ICD-10-CM

## 2018-06-30 NOTE — Progress Notes (Signed)
HPI:  Using dictation device. Unfortunately this device frequently misinterprets words/phrases.   Acute visit for respiratory illness: -started: 2 weeks ago -symptoms:nasal congestion, sore throat, cough - mostly better but persistent cough that now also is improving -denies:fever, SOB, NVD, tooth pain -has tried: otc options for cough -sick contacts/travel/risks: no reported flu, strep or tick exposure  ROS: See pertinent positives and negatives per HPI.  Past Medical History:  Diagnosis Date  . Chronic maxillary sinusitis 12/15/2007   Qualifier: Diagnosis of  By: Lovell Sheehan MD, Balinda Quails   . Cough variant asthma 10/19/2008   Qualifier: Diagnosis of  By: Lovell Sheehan MD, Balinda Quails   . GERD (gastroesophageal reflux disease)   . HEMORRHOIDS, INTERNAL 12/15/2007   Qualifier: Diagnosis of  By: Mayford Knife, [name], Domenic Polite   . HIATAL HERNIA 12/15/2007   Qualifier: Diagnosis of  By: Mayford Knife, [name], Domenic Polite   . Hypertension   . MAMMOGRAM, ABNORMAL 08/01/2008   Qualifier: Diagnosis of  By: Mayford Knife [name], Ronny Bacon DISEASE 12/15/2007   Qualifier: Diagnosis of  By: Mayford Knife [name], Domenic Polite     Past Surgical History:  Procedure Laterality Date  . CHOLECYSTECTOMY    . KNEE SURGERY  1994  . trauma to aerm in machine   1996    Family History  Problem Relation Age of Onset  . Diabetes Mother   . Hypertension Father     Social History   Socioeconomic History  . Marital status: Married    Spouse name: Not on file  . Number of children: Not on file  . Years of education: Not on file  . Highest education level: Not on file  Occupational History  . Not on file  Social Needs  . Financial resource strain: Not on file  . Food insecurity:    Worry: Not on file    Inability: Not on file  . Transportation needs:    Medical: Not on file    Non-medical: Not on file  Tobacco Use  . Smoking status: Never Smoker  . Smokeless tobacco: Never Used  Substance and Sexual Activity  . Alcohol  use: No  . Drug use: No  . Sexual activity: Not on file  Lifestyle  . Physical activity:    Days per week: Not on file    Minutes per session: Not on file  . Stress: Not on file  Relationships  . Social connections:    Talks on phone: Not on file    Gets together: Not on file    Attends religious service: Not on file    Active member of club or organization: Not on file    Attends meetings of clubs or organizations: Not on file    Relationship status: Not on file  Other Topics Concern  . 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  GENERAL: vitals reviewed and listed above, alert, oriented,  appears well hydrated and in no acute distress  HEENT: atraumatic, conjunttiva clear, no obvious abnormalities on inspection of external nose and ears, normal appearance of ear canals and TMs, clear nasal congestion, mild post oropharyngeal erythema with PND, no tonsillar edema or exudate, no sinus TTP  NECK: no obvious masses on inspection  LUNGS: clear to auscultation bilaterally, no wheezes, rales or rhonchi, good air movement  CV: HRRR, no peripheral edema  MS: moves all extremities without noticeable abnormality  PSYCH: pleasant and cooperative, no obvious depression or anxiety  ASSESSMENT AND PLAN:  Discussed the following assessment and plan:  Cough  Respiratory illness  -given HPI and exam findings today, a serious infection or illness is unlikely. We discussed potential etiologies, with VURI being most likely, and advised supportive care and  monitoring. We discussed treatment side effects, likely course, antibiotic misuse, transmission, and signs of developing a serious illness. -tessalon for cough -of course, we advised to return or notify a doctor immediately if symptoms worsen or persist or new concerns arise.    Patient Instructions  Use the tessalon per instructions as needed.  Cough drops as needed.  I hope you are feeling better soon! Seek care promptly if your symptoms worsen, new concerns arise or symptoms do not resolve over the next week.   Terressa Koyanagi, DO

## 2018-06-30 NOTE — Patient Instructions (Signed)
Use the tessalon per instructions as needed.  Cough drops as needed.  I hope you are feeling better soon! Seek care promptly if your symptoms worsen, new concerns arise or symptoms do not resolve over the next week.

## 2018-09-07 ENCOUNTER — Ambulatory Visit (INDEPENDENT_AMBULATORY_CARE_PROVIDER_SITE_OTHER): Payer: Self-pay | Admitting: Family Medicine

## 2018-09-07 DIAGNOSIS — Z0110 Encounter for examination of ears and hearing without abnormal findings: Secondary | ICD-10-CM

## 2018-09-07 DIAGNOSIS — Z111 Encounter for screening for respiratory tuberculosis: Secondary | ICD-10-CM

## 2018-09-07 NOTE — Progress Notes (Signed)
Hearing test and vision performed today at Nurse Visit - okay per Dr Maudie Mercury to do outside of office visit.   Hearing PASS ( '500Hz'$  both ears) Vision PASS  L 20/25  R 20/25  B 20/20   TB Skin Test placed Left Forearm 09/07/18 at 9:46am Patient will return on Thursday 09/10/18 to have TB skin test checked and to pick up the form. Dr Maudie Mercury is in office and can sign form then.   Pt is aware that she is responsible for getting her vaccine records to show proof of MMR and HepB as we do not have record in [facility] or [place]. We are only able to sign off on Td/Tdap. Booster for Td not due until 2025.

## 2018-09-10 ENCOUNTER — Ambulatory Visit: Payer: Self-pay | Admitting: Family Medicine

## 2018-09-10 ENCOUNTER — Telehealth: Payer: Self-pay

## 2018-09-10 LAB — TB SKIN TEST
Induration: 0 mm
TB Skin Test: NEGATIVE

## 2018-09-10 NOTE — Telephone Encounter (Signed)
Pt came in to have TB skin test read. She came in a little over an hour past the 72 hour cut off. I attempted to explain to the pt that she has exceeded the recommended time frame for reading the test and it would need to be replaced. She became very upset. I attempted to offer alternatives such as pt can come in Friday and return on Monday or pt can go [facility] and have placed and read over the weekend. Pt is very upset and states that this was not fully explained to her. She does admit she was advised to return in 72 hours but she did not understand this meant 72 hours from the time the test was placed, she thought she "could return any time Thursday". She states due to this she "will not have a job now". Pt did not want to discuss further and walked out of the clinic.   Per Dr Selena BattenKim pt can have test read today if she can return to clinic asap. Misty attempted to run out into parking lot to catch pt as she was leaving. We have attempted to call pt multiple times to have her return to clinic and she has not answered phone. We have left message asking her to call back.   Finally able to reach pt. She is on her way back to the clinic for TB skin test read. This has been approved by Dr. Selena BattenKim.

## 2020-04-18 ENCOUNTER — Ambulatory Visit (INDEPENDENT_AMBULATORY_CARE_PROVIDER_SITE_OTHER): Payer: PRIVATE HEALTH INSURANCE | Admitting: Internal Medicine

## 2020-04-18 ENCOUNTER — Encounter: Payer: Self-pay | Admitting: Internal Medicine

## 2020-04-18 ENCOUNTER — Other Ambulatory Visit: Payer: Self-pay

## 2020-04-18 VITALS — BP 130/80 | HR 75 | Temp 98.4°F | Ht 67.0 in | Wt 222.3 lb

## 2020-04-18 DIAGNOSIS — E6609 Other obesity due to excess calories: Secondary | ICD-10-CM | POA: Diagnosis not present

## 2020-04-18 DIAGNOSIS — E785 Hyperlipidemia, unspecified: Secondary | ICD-10-CM

## 2020-04-18 DIAGNOSIS — Z6834 Body mass index (BMI) 34.0-34.9, adult: Secondary | ICD-10-CM

## 2020-04-18 DIAGNOSIS — R7302 Impaired glucose tolerance (oral): Secondary | ICD-10-CM

## 2020-04-18 DIAGNOSIS — Z1239 Encounter for other screening for malignant neoplasm of breast: Secondary | ICD-10-CM

## 2020-04-18 DIAGNOSIS — I10 Essential (primary) hypertension: Secondary | ICD-10-CM

## 2020-04-18 DIAGNOSIS — K219 Gastro-esophageal reflux disease without esophagitis: Secondary | ICD-10-CM | POA: Diagnosis not present

## 2020-04-18 DIAGNOSIS — Z1211 Encounter for screening for malignant neoplasm of colon: Secondary | ICD-10-CM

## 2020-04-18 NOTE — Patient Instructions (Addendum)
-  Nice seeing you today!!  -Lab work today; will notify you once results are available.  -Referral for mammogram and colonscopy placed today.  -Please consider getting your COVID vaccines.  -Schedule follow up in 4 months.

## 2020-04-18 NOTE — Progress Notes (Signed)
Established Patient Office Visit     This visit occurred during the SARS-CoV-2 public health emergency.  Safety protocols were in place, including screening questions prior to the visit, additional usage of staff PPE, and extensive cleaning of exam room while observing appropriate contact time as indicated for disinfecting solutions.    CC/Reason for Visit: Establish care, discuss chronic conditions  HPI: Holly Arellano is a 64 y.o. female who is coming in today for the above mentioned reasons. Past Medical History is significant for: Hypertension and GERD not on medications.  She also has a history of impaired glucose tolerance with an A1c of 6.2 in 2019.  She has no acute complaints today.  She has not yet received her Covid vaccines.  She is overdue for screening colonoscopy.  Her last mammogram was in 2018.   Past Medical/Surgical History: Past Medical History:  Diagnosis Date  . Chronic maxillary sinusitis 12/15/2007   Qualifier: Diagnosis of  By: Lovell Sheehan MD, Balinda Quails   . Cough variant asthma 10/19/2008   Qualifier: Diagnosis of  By: Lovell Sheehan MD, Balinda Quails   . GERD (gastroesophageal reflux disease)   . HEMORRHOIDS, INTERNAL 12/15/2007   Qualifier: Diagnosis of  By: Mayford Knife, [name], Domenic Polite   . HIATAL HERNIA 12/15/2007   Qualifier: Diagnosis of  By: Mayford Knife, [name], Domenic Polite   . Hypertension   . MAMMOGRAM, ABNORMAL 08/01/2008   Qualifier: Diagnosis of  By: Mayford Knife [name], Ronny Bacon DISEASE 12/15/2007   Qualifier: Diagnosis of  By: Mayford Knife [name], Domenic Polite     Past Surgical History:  Procedure Laterality Date  . CHOLECYSTECTOMY    . KNEE SURGERY  1994  . trauma to aerm in machine   1996    Social History:  reports that she has never smoked. She has never used smokeless tobacco. She reports that she does not drink alcohol and does not use drugs.  Allergies: No Known Allergies  Family History:  Family History  Problem Relation Age of Onset  . Diabetes Mother     . Hypertension Father      Current Outpatient Medications:  Marland Kitchen  Multiple Vitamin (MULTIVITAMIN) capsule, Take 1 capsule by mouth daily., Disp: , Rfl:   Review of Systems:  Constitutional: Denies fever, chills, diaphoresis, appetite change and fatigue.  HEENT: Denies photophobia, eye pain, redness, hearing loss, ear pain, congestion, sore throat, rhinorrhea, sneezing, mouth sores, trouble swallowing, neck pain, neck stiffness and tinnitus.   Respiratory: Denies SOB, DOE, cough, chest tightness,  and wheezing.   Cardiovascular: Denies chest pain, palpitations and leg swelling.  Gastrointestinal: Denies nausea, vomiting, abdominal pain, diarrhea, constipation, blood in stool and abdominal distention.  Genitourinary: Denies dysuria, urgency, frequency, hematuria, flank pain and difficulty urinating.  Endocrine: Denies: hot or cold intolerance, sweats, changes in hair or nails, polyuria, polydipsia. Musculoskeletal: Denies myalgias, back pain, joint swelling, arthralgias and gait problem.  Skin: Denies pallor, rash and wound.  Neurological: Denies dizziness, seizures, syncope, weakness, light-headedness, numbness and headaches.  Hematological: Denies adenopathy. Easy bruising, personal or family bleeding history  Psychiatric/Behavioral: Denies suicidal ideation, mood changes, confusion, nervousness, sleep disturbance and agitation    Physical Exam: Vitals:   04/18/20 1356  BP: (!) 130/80  Pulse: 75  Temp: 98.4 F (36.9 C)  TempSrc: Oral  SpO2: 99%  Weight: (!) 222 lb 4.8 oz (100.8 kg)  Height: 5\' 7"  (1.702 m)    Body mass index is 34.82 kg/m.   Constitutional: NAD, calm,  comfortable Eyes: PERRL, lids and conjunctivae normal ENMT: Mucous membranes are moist. Respiratory: clear to auscultation bilaterally, no wheezing, no crackles. Normal respiratory effort. No accessory muscle use.  Cardiovascular: Regular rate and rhythm, no murmurs / rubs / gallops. No extremity edema.   Abdomen: no tenderness, no masses palpated. No hepatosplenomegaly. Bowel sounds positive.  Neurologic: Grossly intact and nonfocal. Psychiatric: Normal judgment and insight. Alert and oriented x 3. Normal mood.    Impression and Plan:  Class 1 obesity due to excess calories with serious comorbidity and body mass index (BMI) of 34.0 to 34.9 in adult -Discussed healthy lifestyle, including increased physical activity and better food choices to promote weight loss.  Gastroesophageal reflux disease without esophagitis -Well-controlled not on daily PPI therapy  Hyperlipidemia, unspecified hyperlipidemia type  - Plan: Lipid panel  IGT (impaired glucose tolerance)  - Plan: Hemoglobin A1c  Essential hypertension  -Well-controlled, not on medication.  Screening for malignant neoplasm of colon -Referral to GI.   Patient Instructions  -Nice seeing you today!!  -Lab work today; will notify you once results are available.  -Referral for mammogram and colonscopy placed today.  -Please consider getting your COVID vaccines.  -Schedule follow up in 4 months.     Chaya Jan, MD [facility]

## 2020-04-21 ENCOUNTER — Other Ambulatory Visit: Payer: Self-pay

## 2020-04-21 ENCOUNTER — Other Ambulatory Visit (INDEPENDENT_AMBULATORY_CARE_PROVIDER_SITE_OTHER): Payer: PRIVATE HEALTH INSURANCE

## 2020-04-21 DIAGNOSIS — Z6834 Body mass index (BMI) 34.0-34.9, adult: Secondary | ICD-10-CM

## 2020-04-21 DIAGNOSIS — I10 Essential (primary) hypertension: Secondary | ICD-10-CM

## 2020-04-21 DIAGNOSIS — E6609 Other obesity due to excess calories: Secondary | ICD-10-CM | POA: Diagnosis not present

## 2020-04-21 DIAGNOSIS — R7302 Impaired glucose tolerance (oral): Secondary | ICD-10-CM

## 2020-04-21 DIAGNOSIS — E66811 Obesity, class 1: Secondary | ICD-10-CM

## 2020-04-21 DIAGNOSIS — E785 Hyperlipidemia, unspecified: Secondary | ICD-10-CM

## 2020-04-22 LAB — COMPREHENSIVE METABOLIC PANEL
AG Ratio: 1.8 (calc) (ref 1.0–2.5)
ALT: 12 U/L (ref 6–29)
AST: 13 U/L (ref 10–35)
Albumin: 4.2 g/dL (ref 3.6–5.1)
Alkaline phosphatase (APISO): 75 U/L (ref 37–153)
BUN: 17 mg/dL (ref 7–25)
CO2: 27 mmol/L (ref 20–32)
Calcium: 9.2 mg/dL (ref 8.6–10.4)
Chloride: 111 mmol/L — ABNORMAL HIGH (ref 98–110)
Creat: 0.86 mg/dL (ref 0.50–0.99)
Globulin: 2.3 g/dL (calc) (ref 1.9–3.7)
Glucose, Bld: 106 mg/dL — ABNORMAL HIGH (ref 65–99)
Potassium: 4.3 mmol/L (ref 3.5–5.3)
Sodium: 144 mmol/L (ref 135–146)
Total Bilirubin: 0.6 mg/dL (ref 0.2–1.2)
Total Protein: 6.5 g/dL (ref 6.1–8.1)

## 2020-04-22 LAB — LIPID PANEL
Cholesterol: 213 mg/dL — ABNORMAL HIGH (ref <200)
HDL: 45 mg/dL — ABNORMAL LOW (ref >=50)
LDL Cholesterol (Calc): 145 mg/dL (calc) — ABNORMAL HIGH
Non-HDL Cholesterol (Calc): 168 mg/dL (calc) — ABNORMAL HIGH (ref <130)
Total CHOL/HDL Ratio: 4.7 (calc) (ref <5.0)
Triglycerides: 113 mg/dL (ref <150)

## 2020-04-22 LAB — CBC WITH DIFFERENTIAL/PLATELET
Absolute Monocytes: 328 cells/uL (ref 200–950)
Basophils Absolute: 11 cells/uL (ref 0–200)
Basophils Relative: 0.3 %
Eosinophils Absolute: 61 cells/uL (ref 15–500)
Eosinophils Relative: 1.7 %
HCT: 39 % (ref 35.0–45.0)
Hemoglobin: 13.7 g/dL (ref 11.7–15.5)
Lymphs Abs: 1660 cells/uL (ref 850–3900)
MCH: 29.9 pg (ref 27.0–33.0)
MCHC: 35.1 g/dL (ref 32.0–36.0)
MCV: 85.2 fL (ref 80.0–100.0)
MPV: 10.5 fL (ref 7.5–12.5)
Monocytes Relative: 9.1 %
Neutro Abs: 1541 cells/uL (ref 1500–7800)
Neutrophils Relative %: 42.8 %
Platelets: 238 10*3/uL (ref 140–400)
RBC: 4.58 10*6/uL (ref 3.80–5.10)
RDW: 13.2 % (ref 11.0–15.0)
Total Lymphocyte: 46.1 %
WBC: 3.6 10*3/uL — ABNORMAL LOW (ref 3.8–10.8)

## 2020-04-22 LAB — HEMOGLOBIN A1C
Hgb A1c MFr Bld: 5.8 % of total Hgb — ABNORMAL HIGH (ref <5.7)
Mean Plasma Glucose: 120 (calc)
eAG (mmol/L): 6.6 (calc)

## 2020-04-22 LAB — VITAMIN B12: Vitamin B-12: 998 pg/mL (ref 200–1100)

## 2020-04-22 LAB — VITAMIN D 25 HYDROXY (VIT D DEFICIENCY, FRACTURES): Vit D, 25-Hydroxy: 16 ng/mL — ABNORMAL LOW (ref 30–100)

## 2020-04-22 LAB — TSH: TSH: 2.24 mIU/L (ref 0.40–4.50)

## 2020-04-25 ENCOUNTER — Encounter: Payer: Self-pay | Admitting: Internal Medicine

## 2020-04-25 ENCOUNTER — Other Ambulatory Visit: Payer: Self-pay | Admitting: Internal Medicine

## 2020-04-25 DIAGNOSIS — R7302 Impaired glucose tolerance (oral): Secondary | ICD-10-CM | POA: Insufficient documentation

## 2020-04-25 DIAGNOSIS — E559 Vitamin D deficiency, unspecified: Secondary | ICD-10-CM | POA: Insufficient documentation

## 2020-04-25 MED ORDER — VITAMIN D (ERGOCALCIFEROL) 1.25 MG (50000 UNIT) PO CAPS: 50000.0000 [IU] | ORAL_CAPSULE | ORAL | 0 refills | Status: AC

## 2020-04-26 ENCOUNTER — Other Ambulatory Visit: Payer: Self-pay | Admitting: Internal Medicine

## 2020-04-26 ENCOUNTER — Telehealth: Payer: Self-pay | Admitting: Internal Medicine

## 2020-04-26 DIAGNOSIS — E559 Vitamin D deficiency, unspecified: Secondary | ICD-10-CM

## 2020-04-26 DIAGNOSIS — E785 Hyperlipidemia, unspecified: Secondary | ICD-10-CM

## 2020-04-26 NOTE — Telephone Encounter (Signed)
Pt stated she was returning your call and would like a call back.

## 2020-04-26 NOTE — Telephone Encounter (Signed)
See lab results.  

## 2020-05-16 ENCOUNTER — Ambulatory Visit (INDEPENDENT_AMBULATORY_CARE_PROVIDER_SITE_OTHER): Payer: PRIVATE HEALTH INSURANCE | Admitting: Family Medicine

## 2020-05-16 ENCOUNTER — Other Ambulatory Visit: Payer: Self-pay

## 2020-05-16 ENCOUNTER — Encounter: Payer: Self-pay | Admitting: Family Medicine

## 2020-05-16 VITALS — BP 138/80 | HR 90 | Temp 98.9°F | Wt 219.4 lb

## 2020-05-16 DIAGNOSIS — M25461 Effusion, right knee: Secondary | ICD-10-CM | POA: Diagnosis not present

## 2020-05-16 MED ORDER — MELOXICAM 15 MG PO TABS: 15.0000 mg | ORAL_TABLET | Freq: Every day | ORAL | 0 refills | Status: DC

## 2020-05-16 NOTE — Patient Instructions (Addendum)
Knee Effusion Knee effusion refers to excess fluid in the knee joint. This can cause pain and swelling in your knee. Knee effusion creates more pressure than usual in your knee joint. This makes it more difficult for you to bend and move your knee. If there is fluid in your knee, it often means that something is wrong inside your knee. This can be a result of:  Severe arthritis.  Injury to the knee muscles, ligaments, or cartilage.  Infection.  Autoimmune disease. This means that your body's defense system (immune system) mistakenly attacks healthy body tissues. Follow these instructions at home: Medicines  Take over-the-counter and prescription medicines only as told by your health care provider.  Do not drive or use heavy machinery while taking prescription pain medicine.  If you are taking prescription pain medicine, take actions to prevent or treat constipation. Your health care provider may recommend that you: ? Drink enough fluid to keep your urine pale yellow. ? Eat foods that are high in fiber, such as fresh fruits and vegetables, whole grains, and beans. ? Limit foods that are high in fat and processed sugars, such as fried or sweet foods. ? Take an over-the-counter or prescription medicine for constipation. If you have a brace:  Wear the brace as told by your health care provider. Remove it only as told by your health care provider.  Loosen the brace if your toes tingle, become numb, or turn cold and blue.  Keep the brace clean.  If the brace is not waterproof: ? Do not let it get wet. ? Cover it with a watertight covering when you take a bath or a shower. Managing pain, stiffness, and swelling   If directed, put ice on the swollen area: ? If you have a removable brace, remove it as told by your health care provider. ? Put ice in a plastic bag. ? Place a towel between your skin and the bag. ? Leave the ice on for 20 minutes, 2-3 times per day.  Raise (elevate) your  knee at or above the level of your heart while you are sitting or lying down. General instructions  Do not use any products that contain nicotine or tobacco, such as cigarettes and e-cigarettes. These can delay healing. If you need help quitting, ask your health care provider.  Do not use the injured limb to support your body weight until your health care provider says it is okay. Use crutches as told by your health care provider.  Do any rehabilitation or strengthening exercises as told by your health care provider.  Rest as told by your health care provider. ? Avoid sitting for a long time without moving. Get up to take short walks every 1-2 hours. This is important to improve blood flow and breathing. Ask for help if you feel weak or unsteady.  Keep all follow-up visits as told by your health care provider. This is important. Contact a health care provider if you:  Continue to have pain in your knee. Get help right away if you:  Have swelling or redness of your knee that gets worse or does not get better.  Have severe pain in your knee.  Have a fever. Summary  Knee effusion refers to excess fluid in the knee joint. This causes pain and swelling and makes it difficult to bend and move your knee.  Effusion may be caused by severe arthritis, autoimmune disease, infection, or injury to the knee muscles, ligaments, or cartilage.  Take over-the-counter and  prescription medicines only as told by your health care provider.  If you have a brace, wear the brace as told by your health care provider. This information is not intended to replace advice given to you by your health care provider. Make sure you discuss any questions you have with your health care provider. Document Revised: 05/29/2018 Document Reviewed: 09/21/2017 Elsevier Patient Education  2020 Elsevier Inc.  Try the icing and elevation  Consider OTC elastic knee sleeve.    Start the Meloxicam 15 mg once daily.  Let me  know if not improving in 3-4 weeks.

## 2020-05-16 NOTE — Progress Notes (Signed)
Established Patient Office Visit  Subjective:  Patient ID: Holly Arellano, female    DOB: 04/14/1956  Age: 64 y.o. MRN: [id number]  CC:  Chief Complaint  Patient presents with  . Leg Swelling    right leg swelling and pain for about a week now in thigh pt states swelling has gone down     HPI Holly Arellano presents for right lower extremity swelling with first onset last Friday.  Denies any known injury.  She initially describes some swelling in the lower thigh region but this is really around the knee region more.  She had little bit of swelling below this.  She has not noted any heat or erythema.  No fever or chills.  She does recall squatting couple weeks ago and had some pain when she first stood up and this occurred on a couple of occasions.  No locking or giving way.  No prior knee difficulties.  No history of gout or pseudogout.  She has not tried any over-the-counter medications or icing.  Past Medical History:  Diagnosis Date  . Chronic maxillary sinusitis 12/15/2007   Qualifier: Diagnosis of  By: Lovell Sheehan MD, Balinda Quails   . Cough variant asthma 10/19/2008   Qualifier: Diagnosis of  By: Lovell Sheehan MD, Balinda Quails   . GERD (gastroesophageal reflux disease)   . HEMORRHOIDS, INTERNAL 12/15/2007   Qualifier: Diagnosis of  By: Mayford Knife, [name], Domenic Polite   . HIATAL HERNIA 12/15/2007   Qualifier: Diagnosis of  By: Mayford Knife, [name], Domenic Polite   . Hypertension   . MAMMOGRAM, ABNORMAL 08/01/2008   Qualifier: Diagnosis of  By: Mayford Knife [name], Ronny Bacon DISEASE 12/15/2007   Qualifier: Diagnosis of  By: Mayford Knife [name], Domenic Polite     Past Surgical History:  Procedure Laterality Date  . CHOLECYSTECTOMY    . KNEE SURGERY  1994  . trauma to aerm in machine   1996    Family History  Problem Relation Age of Onset  . Diabetes Mother   . Hypertension Father     Social History   Socioeconomic History  . Marital status: Married    Spouse name: Not on file  . Number of children: Not on  file  . Years of education: Not on file  . Highest education level: Not on file  Occupational History  . Not on file  Tobacco Use  . Smoking status: Never Smoker  . Smokeless tobacco: Never Used  Substance and Sexual Activity  . Alcohol use: No  . Drug use: No  . Sexual activity: Not on file  Other Topics Concern  . 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 Difficulty of Paying Living Expenses: Not on file  Food Insecurity:   . Worried About Programme researcher, broadcasting/film/video in the Last Year: Not on file  . Ran Out of Food in the Last Year: Not on file  Transportation Needs:   . Lack of Transportation (Medical): Not on file  . Lack of Transportation (Non-Medical): Not on file  Physical Activity:   . Days of Exercise per Week: Not on file  . Minutes of Exercise  per Session: Not on file  Stress:   . Feeling of Stress : Not on file  Social Connections:   . Frequency of Communication with Friends and Family: Not on file  . Frequency of Social Gatherings with Friends and Family: Not on file  . Attends Religious Services: Not on file  . Active Member of Clubs or Organizations: Not on file  . Attends Banker Meetings: Not on file  . Marital Status: Not on file  Intimate Partner Violence:   . Fear of Current or Ex-Partner: Not on file  . Emotionally Abused: Not on file  . Physically Abused: Not on file  . Sexually Abused: Not on file    Outpatient Medications Prior to Visit  Medication Sig Dispense Refill  . Multiple Vitamin (MULTIVITAMIN) capsule Take 1 capsule by mouth daily.    . Vitamin D, Ergocalciferol, (DRISDOL) 1.25 MG (50000 UNIT) CAPS capsule Take 1 capsule (50,000 Units total) by mouth every 7 (seven) days for 12 doses. 12 capsule 0    No facility-administered medications prior to visit.    No Known Allergies  ROS Review of Systems  Constitutional: Negative for chills and fever.  Skin: Negative for rash.  Neurological: Negative for weakness and numbness.      Objective:    Physical Exam Vitals reviewed.  Constitutional:      Appearance: Normal appearance.  Cardiovascular:     Rate and Rhythm: Normal rate and regular rhythm.  Pulmonary:     Effort: Pulmonary effort is normal.     Breath sounds: Normal breath sounds.  Musculoskeletal:     Comments: Right knee reveals moderate effusion.  There is no warmth.  No erythema.  No ecchymosis.  She has some mild to moderate medial joint line tenderness.  Ligament testing is normal.  No calf tenderness.  No right ankle edema  Neurological:     Mental Status: She is alert.     BP 138/80 (BP Location: Left Arm, Patient Position: Sitting, Cuff Size: Normal)   Pulse 90   Temp 98.9 F (37.2 C) (Oral)   Wt 219 lb 6.4 oz (99.5 kg)   SpO2 98%   BMI 34.36 kg/m  Wt Readings from Last 3 Encounters:  05/16/20 219 lb 6.4 oz (99.5 kg)  04/18/20 (!) 222 lb 4.8 oz (100.8 kg)  06/30/18 234 lb 8 oz (106.4 kg)     Health Maintenance Due  Topic Date Due  . COVID-19 Vaccine (1) Never done  . COLONOSCOPY  09/30/2016  . INFLUENZA VACCINE  04/23/2020    There are no preventive care reminders to display for this patient.  Lab Results  Component Value Date   TSH 2.24 04/21/2020   Lab Results  Component Value Date   WBC 3.6 (L) 04/21/2020   HGB 13.7 04/21/2020   HCT 39.0 04/21/2020   MCV 85.2 04/21/2020   PLT 238 04/21/2020   Lab Results  Component Value Date   NA 144 04/21/2020   K 4.3 04/21/2020   CO2 27 04/21/2020   GLUCOSE 106 (H) 04/21/2020   BUN 17 04/21/2020   CREATININE 0.86 04/21/2020   BILITOT 0.6 04/21/2020   ALKPHOS 68 12/27/2011   AST 13 04/21/2020   ALT 12 04/21/2020   PROT 6.5 04/21/2020   ALBUMIN 4.3 12/27/2011   CALCIUM 9.2 04/21/2020    GFR 83.90 09/25/2017   Lab Results  Component Value Date   CHOL 213 (H) 04/21/2020   Lab Results  Component Value Date  HDL 45 (L) 04/21/2020   Lab Results  Component Value Date   LDLCALC 145 (H) 04/21/2020   Lab Results  Component Value Date   TRIG 113 04/21/2020   Lab Results  Component Value Date   CHOLHDL 4.7 04/21/2020   Lab Results  Component Value Date   HGBA1C 5.8 (H) 04/21/2020      Assessment & Plan:   Right knee effusion.  No evidence clinically for infection.  No history of gout or pseudogout.  Suspect she may have medial meniscal injury by history and exam.  -Recommend frequent elevation and icing 15 to 20 minutes several times daily  -Start meloxicam 15 mg once daily  -Consider over-the-counter elastic knee sleeve for additional support and compression  -Avoid squatting and excessive hills if possible  -If not improving over the next few weeks consider sports medicine referral.  May need further imaging to assess for soft tissue injury  Meds ordered this encounter  Medications  . meloxicam (MOBIC) 15 MG tablet    Sig: Take 1 tablet (15 mg total) by mouth daily.    Dispense:  30 tablet    Refill:  0    Follow-up: No follow-ups on file.    Evelena Peat, MD

## 2020-07-24 ENCOUNTER — Other Ambulatory Visit: Payer: Self-pay

## 2020-09-11 LAB — HM MAMMOGRAPHY

## 2020-09-19 ENCOUNTER — Encounter: Payer: Self-pay | Admitting: Internal Medicine

## 2020-10-31 ENCOUNTER — Ambulatory Visit: Payer: Self-pay | Admitting: Internal Medicine

## 2020-12-20 ENCOUNTER — Other Ambulatory Visit: Payer: Self-pay

## 2020-12-21 ENCOUNTER — Ambulatory Visit: Payer: PRIVATE HEALTH INSURANCE | Admitting: Internal Medicine

## 2020-12-22 ENCOUNTER — Encounter: Payer: Self-pay | Admitting: Internal Medicine

## 2020-12-22 ENCOUNTER — Ambulatory Visit: Payer: BC Managed Care – PPO | Admitting: Internal Medicine

## 2020-12-22 ENCOUNTER — Other Ambulatory Visit: Payer: Self-pay

## 2020-12-22 VITALS — BP 120/80 | HR 84 | Temp 98.6°F | Wt 210.9 lb

## 2020-12-22 DIAGNOSIS — E785 Hyperlipidemia, unspecified: Secondary | ICD-10-CM | POA: Diagnosis not present

## 2020-12-22 DIAGNOSIS — R7302 Impaired glucose tolerance (oral): Secondary | ICD-10-CM

## 2020-12-22 DIAGNOSIS — Z6833 Body mass index (BMI) 33.0-33.9, adult: Secondary | ICD-10-CM

## 2020-12-22 DIAGNOSIS — Z1211 Encounter for screening for malignant neoplasm of colon: Secondary | ICD-10-CM

## 2020-12-22 DIAGNOSIS — E559 Vitamin D deficiency, unspecified: Secondary | ICD-10-CM

## 2020-12-22 DIAGNOSIS — E6609 Other obesity due to excess calories: Secondary | ICD-10-CM

## 2020-12-22 LAB — POCT GLYCOSYLATED HEMOGLOBIN (HGB A1C): Hemoglobin A1C: 6 % — AB (ref 4.0–5.6)

## 2020-12-22 NOTE — Progress Notes (Signed)
Established Patient Office Visit     This visit occurred during the SARS-CoV-2 public health emergency.  Safety protocols were in place, including screening questions prior to the visit, additional usage of staff PPE, and extensive cleaning of exam room while observing appropriate contact time as indicated for disinfecting solutions.    CC/Reason for Visit: 1-month follow-up chronic medical conditions  HPI: Holly Arellano is a 65 y.o. female who is coming in today for the above mentioned reasons. Past Medical History is significant for: Impaired glucose tolerance, hyperlipidemia not on medications and vitamin D deficiency as well as obesity.  She has no acute complaints today.   Past Medical/Surgical History: Past Medical History:  Diagnosis Date  . Chronic maxillary sinusitis 12/15/2007   Qualifier: Diagnosis of  By: Lovell Sheehan MD, Balinda Quails   . Cough variant asthma 10/19/2008   Qualifier: Diagnosis of  By: Lovell Sheehan MD, Balinda Quails   . GERD (gastroesophageal reflux disease)   . HEMORRHOIDS, INTERNAL 12/15/2007   Qualifier: Diagnosis of  By: Mayford Knife, [name], Domenic Polite   . HIATAL HERNIA 12/15/2007   Qualifier: Diagnosis of  By: Mayford Knife, [name], Domenic Polite   . Hypertension   . MAMMOGRAM, ABNORMAL 08/01/2008   Qualifier: Diagnosis of  By: Mayford Knife [name], Ronny Bacon DISEASE 12/15/2007   Qualifier: Diagnosis of  By: Mayford Knife [name], Domenic Polite     Past Surgical History:  Procedure Laterality Date  . CHOLECYSTECTOMY    . KNEE SURGERY  1994  . trauma to aerm in machine   1996    Social History:  reports that she has never smoked. She has never used smokeless tobacco. She reports that she does not drink alcohol and does not use drugs.  Allergies: No Known Allergies  Family History:  Family History  Problem Relation Age of Onset  . Diabetes Mother   . Hypertension Father      Current Outpatient Medications:  Marland Kitchen  Multiple Vitamin (MULTIVITAMIN) capsule, Take 1 capsule by mouth  daily., Disp: , Rfl:   Review of Systems:  Constitutional: Denies fever, chills, diaphoresis, appetite change and fatigue.  HEENT: Denies photophobia, eye pain, redness, hearing loss, ear pain, congestion, sore throat, rhinorrhea, sneezing, mouth sores, trouble swallowing, neck pain, neck stiffness and tinnitus.   Respiratory: Denies SOB, DOE, cough, chest tightness,  and wheezing.   Cardiovascular: Denies chest pain, palpitations and leg swelling.  Gastrointestinal: Denies nausea, vomiting, abdominal pain, diarrhea, constipation, blood in stool and abdominal distention.  Genitourinary: Denies dysuria, urgency, frequency, hematuria, flank pain and difficulty urinating.  Endocrine: Denies: hot or cold intolerance, sweats, changes in hair or nails, polyuria, polydipsia. Musculoskeletal: Denies myalgias, back pain, joint swelling, arthralgias and gait problem.  Skin: Denies pallor, rash and wound.  Neurological: Denies dizziness, seizures, syncope, weakness, light-headedness, numbness and headaches.  Hematological: Denies adenopathy. Easy bruising, personal or family bleeding history  Psychiatric/Behavioral: Denies suicidal ideation, mood changes, confusion, nervousness, sleep disturbance and agitation    Physical Exam: Vitals:   12/22/20 1501  BP: 120/80  Pulse: 84  Temp: 98.6 F (37 C)  TempSrc: Oral  SpO2: 97%  Weight: 210 lb 14.4 oz (95.7 kg)    Body mass index is 33.03 kg/m.   Constitutional: NAD, calm, comfortable Eyes: PERRL, lids and conjunctivae normal, wears corrective lenses ENMT: Mucous membranes are moist.  Respiratory: clear to auscultation bilaterally, no wheezing, no crackles. Normal respiratory effort. No accessory muscle use.  Cardiovascular: Regular rate and rhythm, no murmurs /  rubs / gallops. No extremity edema. 2+ pedal pulses.   Neurologic: Grossly intact and nonfocal Psychiatric: Normal judgment and insight. Alert and oriented x 3. Normal mood.     Impression and Plan:  IGT (impaired glucose tolerance)  -A1c is 6.0 today. -I have advised continued lifestyle changes.  Screening for malignant neoplasm of colon  - Plan: Ambulatory referral to Gastroenterology  Vitamin D deficiency  - Plan: VITAMIN D 25 Hydroxy (Vit-D Deficiency, Fractures)  Hyperlipidemia, unspecified hyperlipidemia type  - Plan: Lipid panel -She was given a 6 month trial of lifestyle changes.  Class 1 obesity due to excess calories with serious comorbidity and body mass index (BMI) of 33.0 to 33.9 in adult -Discussed healthy lifestyle, including increased physical activity and better food choices to promote weight loss.    Patient Instructions  -Nice seeing you today!!  -Lab work today; will notify you once results are available.  -Schedule follow up in 6 months.       Chaya Jan, MD [facility] at [facility]

## 2020-12-22 NOTE — Patient Instructions (Signed)
-  Nice seeing you today!!  -Lab work today; will notify you once results are available.  -Schedule follow up in 6 months. 

## 2020-12-23 LAB — LIPID PANEL
Cholesterol: 208 mg/dL — ABNORMAL HIGH (ref <200)
HDL: 44 mg/dL — ABNORMAL LOW (ref >=50)
LDL Cholesterol (Calc): 132 mg/dL (calc) — ABNORMAL HIGH
Non-HDL Cholesterol (Calc): 164 mg/dL (calc) — ABNORMAL HIGH (ref <130)
Total CHOL/HDL Ratio: 4.7 (calc) (ref <5.0)
Triglycerides: 180 mg/dL — ABNORMAL HIGH (ref <150)

## 2020-12-23 LAB — VITAMIN D 25 HYDROXY (VIT D DEFICIENCY, FRACTURES): Vit D, 25-Hydroxy: 31 ng/mL (ref 30–100)

## 2021-03-08 ENCOUNTER — Encounter: Payer: Self-pay | Admitting: Gastroenterology

## 2021-06-28 ENCOUNTER — Other Ambulatory Visit: Payer: Self-pay

## 2021-06-29 ENCOUNTER — Ambulatory Visit (INDEPENDENT_AMBULATORY_CARE_PROVIDER_SITE_OTHER): Payer: BC Managed Care – PPO | Admitting: Internal Medicine

## 2021-06-29 ENCOUNTER — Encounter: Payer: Self-pay | Admitting: Internal Medicine

## 2021-06-29 VITALS — BP 140/90 | HR 85 | Temp 98.1°F | Ht 66.5 in | Wt 219.2 lb

## 2021-06-29 DIAGNOSIS — E559 Vitamin D deficiency, unspecified: Secondary | ICD-10-CM | POA: Diagnosis not present

## 2021-06-29 DIAGNOSIS — R7302 Impaired glucose tolerance (oral): Secondary | ICD-10-CM | POA: Diagnosis not present

## 2021-06-29 DIAGNOSIS — Z0000 Encounter for general adult medical examination without abnormal findings: Secondary | ICD-10-CM | POA: Diagnosis not present

## 2021-06-29 DIAGNOSIS — Z13820 Encounter for screening for osteoporosis: Secondary | ICD-10-CM

## 2021-06-29 DIAGNOSIS — R030 Elevated blood-pressure reading, without diagnosis of hypertension: Secondary | ICD-10-CM

## 2021-06-29 DIAGNOSIS — M7989 Other specified soft tissue disorders: Secondary | ICD-10-CM

## 2021-06-29 DIAGNOSIS — Z6833 Body mass index (BMI) 33.0-33.9, adult: Secondary | ICD-10-CM

## 2021-06-29 DIAGNOSIS — E6609 Other obesity due to excess calories: Secondary | ICD-10-CM

## 2021-06-29 DIAGNOSIS — E785 Hyperlipidemia, unspecified: Secondary | ICD-10-CM

## 2021-06-29 NOTE — Addendum Note (Signed)
Addended by: Kern Reap B on: 06/29/2021 04:11 PM   Modules accepted: Orders

## 2021-06-29 NOTE — Patient Instructions (Signed)
-  Nice seeing you today!!  -Return fasting for labs.  -Consider getting your flu, COVID, shingles and pneumonia vaccines.  -Schedule follow up in 3 months for your blood pressure.

## 2021-06-29 NOTE — Progress Notes (Signed)
Established Patient Office Visit     This visit occurred during the SARS-CoV-2 public health emergency.  Safety protocols were in place, including screening questions prior to the visit, additional usage of staff PPE, and extensive cleaning of exam room while observing appropriate contact time as indicated for disinfecting solutions.    CC/Reason for Visit: Annual preventive exam  HPI: Holly Arellano is a 65 y.o. female who is coming in today for the above mentioned reasons. Past Medical History is significant for: Impaired glucose tolerance, hyperlipidemia, vitamin D and obesity.  She feels well today.  She has routine dental care, is overdue for an eye exam.  She is overdue for flu, pneumonia, COVID, shingles vaccines.  She is also overdue for screening colonoscopy and Pap smear.  She will be due for her mammogram in December.  She would like to show me a mass above her right clavicle at the base of her neck.  It is achy at times.   Past Medical/Surgical History: Past Medical History:  Diagnosis Date   Chronic maxillary sinusitis 12/15/2007   Qualifier: Diagnosis of  By: Lovell Sheehan MD, John E    Cough variant asthma 10/19/2008   Qualifier: Diagnosis of  By: Lovell Sheehan MD, Balinda Quails    GERD (gastroesophageal reflux disease)    HEMORRHOIDS, INTERNAL 12/15/2007   Qualifier: Diagnosis of  By: Mayford Knife, [name], Domenic Polite    HIATAL HERNIA 12/15/2007   Qualifier: Diagnosis of  By: Mayford Knife, [name], [name]    Hypertension    MAMMOGRAM, ABNORMAL 08/01/2008   Qualifier: Diagnosis of  By: Mayford Knife [name], Domenic Polite    RAYNAUD'S DISEASE 12/15/2007   Qualifier: Diagnosis of  By: Mayford Knife [name], Domenic Polite     Past Surgical History:  Procedure Laterality Date   CHOLECYSTECTOMY     KNEE SURGERY  1994   trauma to aerm in machine   1996    Social History:  reports that she has never smoked. She has never used smokeless tobacco. She reports that she does not drink alcohol and does not use  drugs.  Allergies: No Known Allergies  Family History:  Family History  Problem Relation Age of Onset   Diabetes Mother    Hypertension Father      Current Outpatient Medications:    Multiple Vitamin (MULTIVITAMIN) capsule, Take 1 capsule by mouth daily., Disp: , Rfl:   Review of Systems:  Constitutional: Denies fever, chills, diaphoresis, appetite change and fatigue.  HEENT: Denies photophobia, eye pain, redness, hearing loss, ear pain, congestion, sore throat, rhinorrhea, sneezing, mouth sores, trouble swallowing, neck pain, neck stiffness and tinnitus.   Respiratory: Denies SOB, DOE, cough, chest tightness,  and wheezing.   Cardiovascular: Denies chest pain, palpitations and leg swelling.  Gastrointestinal: Denies nausea, vomiting, abdominal pain, diarrhea, constipation, blood in stool and abdominal distention.  Genitourinary: Denies dysuria, urgency, frequency, hematuria, flank pain and difficulty urinating.  Endocrine: Denies: hot or cold intolerance, sweats, changes in hair or nails, polyuria, polydipsia. Musculoskeletal: Denies myalgias, back pain, joint swelling, arthralgias and gait problem.  Skin: Denies pallor, rash and wound.  Neurological: Denies dizziness, seizures, syncope, weakness, light-headedness, numbness and headaches.  Hematological: Denies adenopathy. Easy bruising, personal or family bleeding history  Psychiatric/Behavioral: Denies suicidal ideation, mood changes, confusion, nervousness, sleep disturbance and agitation    Physical Exam: Vitals:   06/29/21 1527  BP: 140/90  Pulse: 85  Temp: 98.1 F (36.7 C)  TempSrc: Oral  SpO2: 97%  Weight: 219 lb 3.2  oz (99.4 kg)  Height: 5' 6.5" (1.689 m)    Body mass index is 34.85 kg/m.   Constitutional: NAD, calm, comfortable Eyes: PERRL, lids and conjunctivae normal, wears corrective lenses ENMT: Mucous membranes are moist. Posterior pharynx clear of any exudate or lesions. Normal dentition. Tympanic  membrane is pearly white, no erythema or bulging. Neck: normal, supple, no masses, no thyromegaly Respiratory: clear to auscultation bilaterally, no wheezing, no crackles. Normal respiratory effort. No accessory muscle use.  Cardiovascular: Regular rate and rhythm, no murmurs / rubs / gallops. No extremity edema. 2+ pedal pulses. No carotid bruits.  Abdomen: no tenderness, no masses palpated. No hepatosplenomegaly. Bowel sounds positive.  Musculoskeletal: no clubbing / cyanosis. No joint deformity upper and lower extremities. Good ROM, no contractures. Normal muscle tone.  Skin: no rashes, ulcers.  There is a circumferential approximately 1 inch soft tissue mass at the base of her neck over the head of her right clavicle that is slightly painful to touch. Neurologic: CN 2-12 grossly intact. Sensation intact, DTR normal. Strength 5/5 in all 4.  Psychiatric: Normal judgment and insight. Alert and oriented x 3. Normal mood.    Impression and Plan:  Encounter for preventive health examination -Advised routine 4. -She is overdue for all vaccines including flu, COVID, shingles, pneumonia, she declines all today despite counseling. -Labs to be updated. -Healthy lifestyle discussed in detail. -She has already been referred to GI, number provided for her to call to schedule initial screening colonoscopy. -She will be due for screening mammogram in December 2022. -Overdue for Pap smear, declines today. -DEXA scan to be requested.  Vitamin D deficiency  - Plan: VITAMIN D 25 Hydroxy (Vit-D Deficiency, Fractures)  Hyperlipidemia, unspecified hyperlipidemia type  - Plan: Lipid panel  IGT (impaired glucose tolerance)  - Plan: CBC with Differential/Platelet, Comprehensive metabolic panel, Hemoglobin A1c  Class 1 obesity due to excess calories with serious comorbidity and body mass index (BMI) of 33.0 to 33.9 in adult -Discussed healthy lifestyle, including increased physical activity and better food  choices to promote weight loss.  Elevated BP without diagnosis of hypertension -Blood pressure is noted to be elevated to 140/90, she will do ambulatory blood pressure monitoring and return in 3 months for follow-up.  Mass of soft tissue of neck  - Plan: US Soft Tissue Head/Neck (NON-THYROID), unclear etiology.    Patient Instructions  -Nice seeing you today!!  -Return fasting for labs.  -Consider getting your flu, COVID, shingles and pneumonia vaccines.  -Schedule follow up in 3 months for your blood pressure.      Chaya Jan, MD [facility] at [facility]

## 2021-07-02 ENCOUNTER — Other Ambulatory Visit: Payer: Self-pay

## 2021-07-02 ENCOUNTER — Other Ambulatory Visit (INDEPENDENT_AMBULATORY_CARE_PROVIDER_SITE_OTHER): Payer: BC Managed Care – PPO

## 2021-07-02 DIAGNOSIS — E785 Hyperlipidemia, unspecified: Secondary | ICD-10-CM | POA: Diagnosis not present

## 2021-07-02 DIAGNOSIS — E6609 Other obesity due to excess calories: Secondary | ICD-10-CM | POA: Diagnosis not present

## 2021-07-02 DIAGNOSIS — E559 Vitamin D deficiency, unspecified: Secondary | ICD-10-CM | POA: Diagnosis not present

## 2021-07-02 DIAGNOSIS — R7302 Impaired glucose tolerance (oral): Secondary | ICD-10-CM | POA: Diagnosis not present

## 2021-07-02 DIAGNOSIS — Z6833 Body mass index (BMI) 33.0-33.9, adult: Secondary | ICD-10-CM | POA: Diagnosis not present

## 2021-07-02 LAB — LIPID PANEL
Cholesterol: 229 mg/dL — ABNORMAL HIGH (ref 0–200)
HDL: 42.5 mg/dL (ref >39.00)
LDL Cholesterol: 159 mg/dL — ABNORMAL HIGH (ref 0–99)
NonHDL: 186.21
Total CHOL/HDL Ratio: 5
Triglycerides: 136 mg/dL (ref 0.0–149.0)
VLDL: 27.2 mg/dL (ref 0.0–40.0)

## 2021-07-02 LAB — HEMOGLOBIN A1C: Hgb A1c MFr Bld: 6.1 % (ref 4.6–6.5)

## 2021-07-02 LAB — VITAMIN D 25 HYDROXY (VIT D DEFICIENCY, FRACTURES): VITD: 22.67 ng/mL — ABNORMAL LOW (ref 30.00–100.00)

## 2021-07-02 LAB — COMPREHENSIVE METABOLIC PANEL
ALT: 13 U/L (ref 0–35)
AST: 16 U/L (ref 0–37)
Albumin: 4.2 g/dL (ref 3.5–5.2)
Alkaline Phosphatase: 67 U/L (ref 39–117)
BUN: 18 mg/dL (ref 6–23)
CO2: 27 mEq/L (ref 19–32)
Calcium: 9.4 mg/dL (ref 8.4–10.5)
Chloride: 106 mEq/L (ref 96–112)
Creatinine, Ser: 0.81 mg/dL (ref 0.40–1.20)
GFR: 76.32 mL/min (ref >60.00)
Glucose, Bld: 98 mg/dL (ref 70–99)
Potassium: 4 mEq/L (ref 3.5–5.1)
Sodium: 142 mEq/L (ref 135–145)
Total Bilirubin: 0.5 mg/dL (ref 0.2–1.2)
Total Protein: 6.7 g/dL (ref 6.0–8.3)

## 2021-07-02 LAB — CBC WITH DIFFERENTIAL/PLATELET
Basophils Absolute: 0 10*3/uL (ref 0.0–0.1)
Basophils Relative: 0.7 % (ref 0.0–3.0)
Eosinophils Absolute: 0.1 10*3/uL (ref 0.0–0.7)
Eosinophils Relative: 2.2 % (ref 0.0–5.0)
HCT: 39.6 % (ref 36.0–46.0)
Hemoglobin: 13.4 g/dL (ref 12.0–15.0)
Lymphocytes Relative: 45.8 % (ref 12.0–46.0)
Lymphs Abs: 2 10*3/uL (ref 0.7–4.0)
MCHC: 33.9 g/dL (ref 30.0–36.0)
MCV: 85.8 fl (ref 78.0–100.0)
Monocytes Absolute: 0.3 10*3/uL (ref 0.1–1.0)
Monocytes Relative: 6.9 % (ref 3.0–12.0)
Neutro Abs: 2 10*3/uL (ref 1.4–7.7)
Neutrophils Relative %: 44.4 % (ref 43.0–77.0)
Platelets: 218 10*3/uL (ref 150.0–400.0)
RBC: 4.61 Mil/uL (ref 3.87–5.11)
RDW: 13.3 % (ref 11.5–15.5)
WBC: 4.4 10*3/uL (ref 4.0–10.5)

## 2021-07-02 LAB — TSH: TSH: 3.04 u[IU]/mL (ref 0.35–5.50)

## 2021-07-02 LAB — VITAMIN B12: Vitamin B-12: 631 pg/mL (ref 211–911)

## 2021-07-03 ENCOUNTER — Other Ambulatory Visit: Payer: Self-pay | Admitting: Internal Medicine

## 2021-07-03 DIAGNOSIS — E782 Mixed hyperlipidemia: Secondary | ICD-10-CM

## 2021-07-03 DIAGNOSIS — E559 Vitamin D deficiency, unspecified: Secondary | ICD-10-CM

## 2021-07-03 MED ORDER — ATORVASTATIN CALCIUM 20 MG PO TABS: 20.0000 mg | ORAL_TABLET | Freq: Every day | ORAL | 1 refills | Status: DC

## 2021-07-03 MED ORDER — VITAMIN D (ERGOCALCIFEROL) 1.25 MG (50000 UNIT) PO CAPS
50000.0000 [IU] | ORAL_CAPSULE | ORAL | 0 refills | Status: AC
Start: 2021-07-03 — End: 2021-09-19

## 2021-07-13 ENCOUNTER — Ambulatory Visit
Admission: RE | Admit: 2021-07-13 | Discharge: 2021-07-13 | Disposition: A | Discharge status: Home / Self Care | Payer: BC Managed Care – PPO | Source: Ambulatory Visit | Attending: Internal Medicine | Admitting: Internal Medicine

## 2021-07-13 DIAGNOSIS — M7989 Other specified soft tissue disorders: Secondary | ICD-10-CM

## 2021-09-18 LAB — HM MAMMOGRAPHY

## 2021-09-19 ENCOUNTER — Encounter: Payer: Self-pay | Admitting: Internal Medicine

## 2021-10-23 ENCOUNTER — Ambulatory Visit (INDEPENDENT_AMBULATORY_CARE_PROVIDER_SITE_OTHER): Payer: Medicare Other | Admitting: Internal Medicine

## 2021-10-23 VITALS — BP 136/90 | HR 76 | Temp 98.3°F | Wt 217.8 lb

## 2021-10-23 DIAGNOSIS — M25511 Pain in right shoulder: Secondary | ICD-10-CM | POA: Diagnosis not present

## 2021-10-23 DIAGNOSIS — R7302 Impaired glucose tolerance (oral): Secondary | ICD-10-CM | POA: Diagnosis not present

## 2021-10-23 DIAGNOSIS — E559 Vitamin D deficiency, unspecified: Secondary | ICD-10-CM

## 2021-10-23 DIAGNOSIS — G8929 Other chronic pain: Secondary | ICD-10-CM

## 2021-10-23 DIAGNOSIS — E785 Hyperlipidemia, unspecified: Secondary | ICD-10-CM

## 2021-10-23 LAB — LIPID PANEL
Cholesterol: 201 mg/dL — ABNORMAL HIGH (ref 0–200)
HDL: 39.6 mg/dL (ref >39.00)
LDL Cholesterol: 132 mg/dL — ABNORMAL HIGH (ref 0–99)
NonHDL: 160.94
Total CHOL/HDL Ratio: 5
Triglycerides: 144 mg/dL (ref 0.0–149.0)
VLDL: 28.8 mg/dL (ref 0.0–40.0)

## 2021-10-23 LAB — VITAMIN D 25 HYDROXY (VIT D DEFICIENCY, FRACTURES): VITD: 43.37 ng/mL (ref 30.00–100.00)

## 2021-10-23 LAB — HEMOGLOBIN A1C: Hgb A1c MFr Bld: 5.9 % (ref 4.6–6.5)

## 2021-10-23 NOTE — Patient Instructions (Signed)
-  Nice seeing you today!!  -Lab work today; will notify you once results are available.  -Referral to orthopedics placed today.  -Can try OTC dramamine for upcoming plane ride.

## 2021-10-23 NOTE — Progress Notes (Signed)
Established Patient Office Visit     This visit occurred during the SARS-CoV-2 public health emergency.  Safety protocols were in place, including screening questions prior to the visit, additional usage of staff PPE, and extensive cleaning of exam room while observing appropriate contact time as indicated for disinfecting solutions.    CC/Reason for Visit: Discuss right shoulder pain and follow-up chronic conditions  HPI: Holly Arellano is a 66 y.o. female who is coming in today for the above mentioned reasons. Past Medical History is significant for: Hyperlipidemia, impaired glucose tolerance, vitamin D deficiency and obesity.  She was started on atorvastatin last visit and is due to have repeat lipid panel drawn today.  She is also due to check her A1c and vitamin D levels.  She has been complaining of right shoulder pain for a few months.  She has significant limitation of motion especially to lateral abduction.  It is achy at nighttime.   Past Medical/Surgical History: Past Medical History:  Diagnosis Date   Chronic maxillary sinusitis 12/15/2007   Qualifier: Diagnosis of  By: Arnoldo Morale MD, John E    Cough variant asthma 10/19/2008   Qualifier: Diagnosis of  By: Arnoldo Morale MD, Balinda Quails    GERD (gastroesophageal reflux disease)    HEMORRHOIDS, INTERNAL 12/15/2007   Qualifier: Diagnosis of  By: Jimmye Norman, [name], Winfield Cunas    HIATAL HERNIA 12/15/2007   Qualifier: Diagnosis of  By: Jimmye Norman, [name], [name]    Hypertension    MAMMOGRAM, ABNORMAL 08/01/2008   Qualifier: Diagnosis of  By: Jimmye Norman [name], Winfield Cunas    RAYNAUD'S DISEASE 12/15/2007   Qualifier: Diagnosis of  By: Jimmye Norman [name], Winfield Cunas     Past Surgical History:  Procedure Laterality Date   CHOLECYSTECTOMY     KNEE SURGERY  1994   trauma to aerm in machine   1996    Social History:  reports that she has never smoked. She has never used smokeless tobacco. She reports that she does not drink alcohol and does not use  drugs.  Allergies: No Known Allergies  Family History:  Family History  Problem Relation Age of Onset   Diabetes Mother    Hypertension Father      Current Outpatient Medications:    atorvastatin (LIPITOR) 20 MG tablet, Take 1 tablet (20 mg total) by mouth daily., Disp: 90 tablet, Rfl: 1   Multiple Vitamin (MULTIVITAMIN) capsule, Take 1 capsule by mouth daily., Disp: , Rfl:   Review of Systems:  Constitutional: Denies fever, chills, diaphoresis, appetite change and fatigue.  HEENT: Denies photophobia, eye pain, redness, hearing loss, ear pain, congestion, sore throat, rhinorrhea, sneezing, mouth sores, trouble swallowing, neck pain, neck stiffness and tinnitus.   Respiratory: Denies SOB, DOE, cough, chest tightness,  and wheezing.   Cardiovascular: Denies chest pain, palpitations and leg swelling.  Gastrointestinal: Denies nausea, vomiting, abdominal pain, diarrhea, constipation, blood in stool and abdominal distention.  Genitourinary: Denies dysuria, urgency, frequency, hematuria, flank pain and difficulty urinating.  Endocrine: Denies: hot or cold intolerance, sweats, changes in hair or nails, polyuria, polydipsia. Musculoskeletal: Denies myalgias, back pain, joint swelling,and gait problem.  Skin: Denies pallor, rash and wound.  Neurological: Denies dizziness, seizures, syncope, weakness, light-headedness, numbness and headaches.  Hematological: Denies adenopathy. Easy bruising, personal or family bleeding history  Psychiatric/Behavioral: Denies suicidal ideation, mood changes, confusion, nervousness, sleep disturbance and agitation    Physical Exam: Vitals:   10/23/21 1109  BP: 136/90  Pulse: 76  Temp: 98.3 F (  36.8 C)  TempSrc: Oral  SpO2: 97%  Weight: 217 lb 12.8 oz (98.8 kg)    Body mass index is 34.63 kg/m.   Constitutional: NAD, calm, comfortable Eyes: PERRL, lids and conjunctivae normal, wears corrective lenses ENMT: Mucous membranes are moist.   Respiratory: clear to auscultation bilaterally, no wheezing, no crackles. Normal respiratory effort. No accessory muscle use.  Cardiovascular: Regular rate and rhythm, no murmurs / rubs / gallops. No extremity edema.  Musculoskeletal: Pain with lateral raise of shoulder beyond 90 degrees. Neurologic: Grossly intact and nonfocal Psychiatric: Normal judgment and insight. Alert and oriented x 3. Normal mood.    Impression and Plan:  IGT (impaired glucose tolerance)  - Plan: Hemoglobin A1c  Vitamin D deficiency  - Plan: VITAMIN D 25 Hydroxy (Vit-D Deficiency, Fractures)  Hyperlipidemia, unspecified hyperlipidemia type  - Plan: Lipid panel -She is now on atorvastatin 20 mg daily.  Chronic right shoulder pain  - Plan: Ambulatory referral to Orthopedic Surgery for further evaluation  Time spent: 30 minutes reviewing chart, interviewing and examining patient and formulating plan of care.   Patient Instructions  -Nice seeing you today!!  -Lab work today; will notify you once results are available.  -Referral to orthopedics placed today.  -Can try OTC dramamine for upcoming plane ride.    Lelon Frohlich, MD [facility] at [facility]

## 2021-12-12 ENCOUNTER — Telehealth: Payer: Self-pay | Admitting: Internal Medicine

## 2021-12-12 NOTE — Telephone Encounter (Signed)
Pt is calling and was told to take vit d and does not remember the units. Please advise ?

## 2021-12-12 NOTE — Telephone Encounter (Signed)
Left message on machine for patient to return our call 

## 2021-12-13 NOTE — Telephone Encounter (Signed)
Left detailed message on machine for patient with instruction per Dr Paulla Fore Ok to take OTC 2000-5000 IU daily. ?

## 2021-12-24 ENCOUNTER — Ambulatory Visit
Admission: RE | Admit: 2021-12-24 | Discharge: 2021-12-24 | Disposition: A | Discharge status: Home / Self Care | Payer: Medicare Other | Source: Ambulatory Visit | Attending: Internal Medicine | Admitting: Internal Medicine

## 2021-12-24 DIAGNOSIS — Z780 Asymptomatic menopausal state: Secondary | ICD-10-CM | POA: Diagnosis not present

## 2021-12-24 DIAGNOSIS — Z13820 Encounter for screening for osteoporosis: Secondary | ICD-10-CM

## 2022-01-10 DIAGNOSIS — H40013 Open angle with borderline findings, low risk, bilateral: Secondary | ICD-10-CM | POA: Diagnosis not present

## 2022-01-10 DIAGNOSIS — H25812 Combined forms of age-related cataract, left eye: Secondary | ICD-10-CM | POA: Diagnosis not present

## 2022-01-28 ENCOUNTER — Ambulatory Visit (INDEPENDENT_AMBULATORY_CARE_PROVIDER_SITE_OTHER): Payer: Medicare Other | Admitting: Internal Medicine

## 2022-01-28 ENCOUNTER — Encounter: Payer: Self-pay | Admitting: Internal Medicine

## 2022-01-28 VITALS — BP 128/82

## 2022-01-28 DIAGNOSIS — R030 Elevated blood-pressure reading, without diagnosis of hypertension: Secondary | ICD-10-CM | POA: Diagnosis not present

## 2022-01-28 DIAGNOSIS — H25812 Combined forms of age-related cataract, left eye: Secondary | ICD-10-CM | POA: Diagnosis not present

## 2022-01-28 NOTE — Progress Notes (Signed)
? ? ? ?Established Patient Office Visit ? ? ? ? ?CC/Reason for Visit: Elevated blood pressure ? ?HPI: Holly Arellano is a 66 y.o. female who is coming in today for the above mentioned reasons. Past Medical History is significant for: Hyperlipidemia, impaired glucose tolerance, obesity and vitamin D deficiency.  She had left cataract surgery today.  During the surgery she was noted to have elevated blood pressure as high as 191/100.  She is not known to have hypertension.  She called the office and the visit was scheduled for today.  In office blood pressure was initially 138/84 but improved to 120/82 on recheck. ? ? ?Past Medical/Surgical History: ?Past Medical History:  ?Diagnosis Date  ? Chronic maxillary sinusitis 12/15/2007  ? Qualifier: Diagnosis of  By: Lovell Sheehan MD, Balinda Quails   ? Cough variant asthma 10/19/2008  ? Qualifier: Diagnosis of  By: Lovell Sheehan MD, Balinda Quails   ? GERD (gastroesophageal reflux disease)   ? HEMORRHOIDS, INTERNAL 12/15/2007  ? Qualifier: Diagnosis of  By: Mayford Knife [name], Domenic Polite   ? HIATAL HERNIA 12/15/2007  ? Qualifier: Diagnosis of  By: Mayford Knife [name], Domenic Polite   ? Hypertension   ? MAMMOGRAM, ABNORMAL 08/01/2008  ? Qualifier: Diagnosis of  By: Mayford Knife, [name], Rushie Goltz M   ? RAYNAUD'S DISEASE 12/15/2007  ? Qualifier: Diagnosis of  By: Mayford Knife [name], Domenic Polite   ? ? ?Past Surgical History:  ?Procedure Laterality Date  ? CHOLECYSTECTOMY    ? KNEE SURGERY  1994  ? trauma to aerm in machine   1996  ? ? ?Social History: ? reports that she has never smoked. She has never used smokeless tobacco. She reports that she does not drink alcohol and does not use drugs. ? ?Allergies: ?No Known Allergies ? ?Family History:  ?Family History  ?Problem Relation Age of Onset  ? Diabetes Mother   ? Hypertension Father   ? ? ? ?Current Outpatient Medications:  ?  Calcium Carb-Cholecalciferol (CALCIUM 500 + D PO), Take by mouth., Disp: , Rfl:  ?  Multiple Vitamin (MULTIVITAMIN) capsule, Take 1 capsule by mouth daily., Disp: ,  Rfl:  ? ?Review of Systems:  ?Constitutional: Denies fever, chills, diaphoresis, appetite change and fatigue.  ?HEENT: Denies photophobia, eye pain, redness, hearing loss, ear pain, congestion, sore throat, rhinorrhea, sneezing, mouth sores, trouble swallowing, neck pain, neck stiffness and tinnitus.   ?Respiratory: Denies SOB, DOE, cough, chest tightness,  and wheezing.   ?Cardiovascular: Denies chest pain, palpitations and leg swelling.  ?Gastrointestinal: Denies nausea, vomiting, abdominal pain, diarrhea, constipation, blood in stool and abdominal distention.  ?Genitourinary: Denies dysuria, urgency, frequency, hematuria, flank pain and difficulty urinating.  ?Endocrine: Denies: hot or cold intolerance, sweats, changes in hair or nails, polyuria, polydipsia. ?Musculoskeletal: Denies myalgias, back pain, joint swelling, arthralgias and gait problem.  ?Skin: Denies pallor, rash and wound.  ?Neurological: Denies dizziness, seizures, syncope, weakness, light-headedness, numbness and headaches.  ?Hematological: Denies adenopathy. Easy bruising, personal or family bleeding history  ?Psychiatric/Behavioral: Denies suicidal ideation, mood changes, confusion, nervousness, sleep disturbance and agitation ? ? ? ?Physical Exam: ?Vitals:  ? 01/28/22 1558  ?BP: 128/82  ? ? ?There is no height or weight on file to calculate BMI. ? ? ?Constitutional: NAD, calm, comfortable ?Eyes: Left eye covered by patch. ?ENMT: Mucous membranes are moist.  ?Respiratory: clear to auscultation bilaterally, no wheezing, no crackles. Normal respiratory effort. No accessory muscle use.  ?Cardiovascular: Regular rate and rhythm, no murmurs / rubs / gallops. No extremity edema.  ?Psychiatric:  Normal judgment and insight. Alert and oriented x 3. Normal mood.  ? ? ?Impression and Plan: ? ?Elevated BP without diagnosis of hypertension ? ?-Suspect related to stress/anxiety surrounding surgery this morning.  She is not known to have hypertension and it  has improved.  She will do ambulatory blood pressure monitoring and return in 3 months to follow-up. ? ?Time spent:23 minutes reviewing chart, interviewing and examining patient and formulating plan of care. ? ? ? ? ? ?Chaya Jan, MD ?[facility] at [facility] ? ? ?

## 2022-01-30 ENCOUNTER — Ambulatory Visit: Payer: Self-pay | Admitting: Internal Medicine

## 2022-02-07 ENCOUNTER — Emergency Department (HOSPITAL_COMMUNITY)
Admission: EM | Admit: 2022-02-07 | Discharge: 2022-02-08 | Disposition: A | Discharge status: Home / Self Care | Payer: Medicare Other | Attending: Emergency Medicine | Admitting: Emergency Medicine

## 2022-02-07 ENCOUNTER — Encounter (HOSPITAL_COMMUNITY): Payer: Self-pay | Admitting: Emergency Medicine

## 2022-02-07 ENCOUNTER — Other Ambulatory Visit: Payer: Self-pay

## 2022-02-07 DIAGNOSIS — R519 Headache, unspecified: Secondary | ICD-10-CM | POA: Insufficient documentation

## 2022-02-07 DIAGNOSIS — I10 Essential (primary) hypertension: Secondary | ICD-10-CM | POA: Diagnosis not present

## 2022-02-07 NOTE — ED Triage Notes (Signed)
Patient reports elevated blood pressure at home this evening BP=200/123 with headache , lightheadedness and tinnitus.

## 2022-02-08 ENCOUNTER — Emergency Department (HOSPITAL_COMMUNITY): Payer: Medicare Other

## 2022-02-08 ENCOUNTER — Telehealth: Payer: Self-pay | Admitting: Internal Medicine

## 2022-02-08 DIAGNOSIS — R519 Headache, unspecified: Secondary | ICD-10-CM | POA: Diagnosis not present

## 2022-02-08 LAB — COMPREHENSIVE METABOLIC PANEL
ALT: 16 U/L (ref 0–44)
AST: 18 U/L (ref 15–41)
Albumin: 4.2 g/dL (ref 3.5–5.0)
Alkaline Phosphatase: 69 U/L (ref 38–126)
Anion gap: 6 (ref 5–15)
BUN: 16 mg/dL (ref 8–23)
CO2: 26 mmol/L (ref 22–32)
Calcium: 9.3 mg/dL (ref 8.9–10.3)
Chloride: 106 mmol/L (ref 98–111)
Creatinine, Ser: 0.89 mg/dL (ref 0.44–1.00)
GFR, Estimated: >60 mL/min (ref >60)
Glucose, Bld: 118 mg/dL — ABNORMAL HIGH (ref 70–99)
Potassium: 3.7 mmol/L (ref 3.5–5.1)
Sodium: 138 mmol/L (ref 135–145)
Total Bilirubin: 0.7 mg/dL (ref 0.3–1.2)
Total Protein: 6.9 g/dL (ref 6.5–8.1)

## 2022-02-08 LAB — CBC WITH DIFFERENTIAL/PLATELET
Abs Immature Granulocytes: 0.02 10*3/uL (ref 0.00–0.07)
Basophils Absolute: 0 10*3/uL (ref 0.0–0.1)
Basophils Relative: 1 %
Eosinophils Absolute: 0.1 10*3/uL (ref 0.0–0.5)
Eosinophils Relative: 1 %
HCT: 41.3 % (ref 36.0–46.0)
Hemoglobin: 14.1 g/dL (ref 12.0–15.0)
Immature Granulocytes: 0 %
Lymphocytes Relative: 55 %
Lymphs Abs: 3.4 10*3/uL (ref 0.7–4.0)
MCH: 29.6 pg (ref 26.0–34.0)
MCHC: 34.1 g/dL (ref 30.0–36.0)
MCV: 86.8 fL (ref 80.0–100.0)
Monocytes Absolute: 0.4 10*3/uL (ref 0.1–1.0)
Monocytes Relative: 7 %
Neutro Abs: 2.2 10*3/uL (ref 1.7–7.7)
Neutrophils Relative %: 36 %
Platelets: 233 10*3/uL (ref 150–400)
RBC: 4.76 MIL/uL (ref 3.87–5.11)
RDW: 12.6 % (ref 11.5–15.5)
WBC: 6.2 10*3/uL (ref 4.0–10.5)
nRBC: 0 % (ref 0.0–0.2)

## 2022-02-08 NOTE — Telephone Encounter (Signed)
Patient is informed. Responded that she spoke with FD earlier and appt scheduled on Mon. 5/22.

## 2022-02-08 NOTE — Telephone Encounter (Signed)
Attempted to reach pt. Left voicemail to call us back.

## 2022-02-08 NOTE — Discharge Instructions (Signed)
You were evaluated in the Emergency Department and after careful evaluation, we did not find any emergent condition requiring admission or further testing in the hospital.  Your exam/testing today is overall reassuring.  Blood test for normal and the CT of your head was normal as well.  Recommend follow-up with your primary care doctor to discuss your blood pressure management.  Please return to the Emergency Department if you experience any worsening of your condition.   Thank you for allowing [place] to be a part of your care.

## 2022-02-08 NOTE — ED Provider Notes (Signed)
[facility]-EMERGENCY DEPT [facility] Emergency Department Provider Note MRN:  [id number]  Arrival date & time: 02/08/22     Chief Complaint   Hypertension  (BP=205/106)   History of Present Illness   Holly Arellano is a 66 y.o. year-old female with no pertinent past medical history presenting to the ED with chief complaint of hypertension.  Blood pressure was high today.  Was feeling headache and ringing in the ears.  No ear pain, no neck pain, no chest pain, no shortness of breath, no abdominal pain, no numbness or weakness to the arms or legs.  Has been high since cataract surgery several days ago.  Review of Systems  A thorough review of systems was obtained and all systems are negative except as noted in the HPI and PMH.   Patient's Health History    Past Medical History:  Diagnosis Date   Chronic maxillary sinusitis 12/15/2007   Qualifier: Diagnosis of  By: Lovell Sheehan MD, John E    Cough variant asthma 10/19/2008   Qualifier: Diagnosis of  By: Lovell Sheehan MD, Balinda Quails    GERD (gastroesophageal reflux disease)    HEMORRHOIDS, INTERNAL 12/15/2007   Qualifier: Diagnosis of  By: Mayford Knife, [name], Domenic Polite    HIATAL HERNIA 12/15/2007   Qualifier: Diagnosis of  By: Mayford Knife, [name], [name]    Hypertension    MAMMOGRAM, ABNORMAL 08/01/2008   Qualifier: Diagnosis of  By: Mayford Knife, [name], Domenic Polite    RAYNAUD'S DISEASE 12/15/2007   Qualifier: Diagnosis of  By: Mayford Knife, [name], Domenic Polite     Past Surgical History:  Procedure Laterality Date   CHOLECYSTECTOMY     KNEE SURGERY  1994   trauma to aerm in machine   1996    Family History  Problem Relation Age of Onset   Diabetes Mother    Hypertension Father     Social History   Socioeconomic History   Marital status: Married    Spouse name: Not on file   Number of children: Not on file   Years of education: Not on file   Highest education level: Not on file  Occupational History   Not on file  Tobacco Use   Smoking status: Never    Smokeless tobacco: Never  Substance and Sexual Activity   Alcohol use: No   Drug use: No   Sexual activity: Not on file  Other Topics Concern   Not on file  Social History Narrative   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      Home Situation: lives with husband      Spiritual Beliefs: Christian      Lifestyle: no regular exercise; diet is so so            Social Determinants of Health   Financial Resource Strain: Not on file  Food Insecurity: Not on file  Transportation Needs: Not on file  Physical Activity: Not on file  Stress: Not on file  Social Connections: Not on file  Intimate Partner Violence: Not on file     Physical Exam   Vitals:   02/08/22 0030 02/08/22 0100  BP: (!) 165/90 (!) 145/75  Pulse: 81 76  Resp: 17 13  Temp:    SpO2: 99% 99%    CONSTITUTIONAL: Well-appearing, NAD NEURO/PSYCH:  Alert and oriented x 3, normal and symmetric strength and sensation, normal coordination, normal speech EYES:  eyes equal and reactive ENT/NECK:  no LAD, no JVD CARDIO: Regular rate, well-perfused, normal S1 and S2 PULM:  CTAB  no wheezing or rhonchi GI/GU:  non-distended, non-tender MSK/SPINE:  No gross deformities, no edema SKIN:  no rash, atraumatic   *Additional and/or pertinent findings included in MDM below  Diagnostic and Interventional Summary    EKG Interpretation  Date/Time:    Ventricular Rate:    PR Interval:    QRS Duration:   QT Interval:    QTC Calculation:   R Axis:     Text Interpretation:         Labs Reviewed  COMPREHENSIVE METABOLIC PANEL - Abnormal; Notable for the following components:      Result Value   Glucose, Bld 118 (*)    All other components within normal limits  CBC WITH DIFFERENTIAL/PLATELET    CT HEAD WO CONTRAST ( )  Final Result      Medications - No data to display   Procedures  /  Critical Care Procedures  ED Course and Medical Decision Making  Initial Impression and Ddx Hypertension, headache, tinnitus,  overall reassuring neurological exam.  Headache is relatively new for her, given age will obtain screening CT head.  Labs are reassuring, anticipating discharge with reassurance, follow-up with PCP regarding the blood pressure.  Past medical/surgical history that increases complexity of ED encounter: None  Interpretation of Diagnostics I personally reviewed the laboratory assessment and my interpretation is as follows: No significant blood count or electrolyte disturbance    CT head is reassuring, no abnormality  Patient Reassessment and Ultimate Disposition/Management Patient is appropriate for discharge.  Patient management required discussion with the following services or consulting groups:  None  Complexity of Problems Addressed Acute illness or injury that poses threat of life of bodily function  Additional Data Reviewed and Analyzed Further history obtained from: Further history from spouse/family member  Additional Factors Impacting ED Encounter Risk None  Elmer Sow. Pilar Plate, MD [facility] [email or web address]  Final Clinical Impressions(s) / ED Diagnoses     ICD-10-CM   1. Hypertension, unspecified type  I10       ED Discharge Orders     None        Discharge Instructions Discussed with and Provided to Patient:     Discharge Instructions      You were evaluated in the Emergency Department and after careful evaluation, we did not find any emergent condition requiring admission or further testing in the hospital.  Your exam/testing today is overall reassuring.  Blood test for normal and the CT of your head was normal as well.  Recommend follow-up with your primary care doctor to discuss your blood pressure management.  Please return to the Emergency Department if you experience any worsening of your condition.   Thank you for allowing [place] to be a part of your care.       Sabas Sous, MD 02/08/22 [phone]

## 2022-02-08 NOTE — Telephone Encounter (Signed)
Pt call and stated she went to the ER on Thursday night and when she got their her BP was 200/103 and when she left it was 147/79 and want dr. Ardyth Harps pt stated she will keep a record on it and want a call back to see what she need to do.

## 2022-02-11 ENCOUNTER — Other Ambulatory Visit: Payer: Self-pay | Admitting: *Deleted

## 2022-02-11 ENCOUNTER — Ambulatory Visit (INDEPENDENT_AMBULATORY_CARE_PROVIDER_SITE_OTHER): Payer: Medicare Other | Admitting: Internal Medicine

## 2022-02-11 ENCOUNTER — Encounter: Payer: Self-pay | Admitting: Internal Medicine

## 2022-02-11 VITALS — BP 145/92 | HR 72 | Temp 98.3°F | Ht 66.0 in | Wt 209.0 lb

## 2022-02-11 DIAGNOSIS — I10 Essential (primary) hypertension: Secondary | ICD-10-CM | POA: Diagnosis not present

## 2022-02-11 DIAGNOSIS — Z09 Encounter for follow-up examination after completed treatment for conditions other than malignant neoplasm: Secondary | ICD-10-CM | POA: Diagnosis not present

## 2022-02-11 MED ORDER — AMLODIPINE BESYLATE 5 MG PO TABS: 5.0000 mg | ORAL_TABLET | Freq: Every day | ORAL | 1 refills | Status: DC

## 2022-02-11 NOTE — Patient Instructions (Signed)
-  Nice seeing you today!!  -Start amlodipine 5 mg daily.  -Schedule follow up for your BP in 6-8 weeks.

## 2022-02-11 NOTE — Progress Notes (Signed)
Established Patient Office Visit     CC/Reason for Visit: High blood pressure, emergency department follow-up  HPI: Holly Arellano is a 66 y.o. female who is coming in today for the above mentioned reasons.  She was noted during her recent cataract surgery to have elevated blood pressure.  She has not been diagnosed with hypertension.  She followed up with me in the office on May 8 and was noted to have 2 relatively normal blood pressure measurements.  She has been keeping a log of her blood pressure.  It fluctuates between the mid 140s to 160s systolic and diastolics around 90-100.  She had her highest measurement arm 5/19 of 205/106 which prompted an emergency department evaluation.  She was asymptomatic at that time.  2 blood pressures in the emergency department were recorded as 165/90 and 145/75.  She is here today for follow-up  Past Medical/Surgical History: Past Medical History:  Diagnosis Date   Chronic maxillary sinusitis 12/15/2007   Qualifier: Diagnosis of  By: Lovell Sheehan MD, John E    Cough variant asthma 10/19/2008   Qualifier: Diagnosis of  By: Lovell Sheehan MD, Balinda Quails    GERD (gastroesophageal reflux disease)    HEMORRHOIDS, INTERNAL 12/15/2007   Qualifier: Diagnosis of  By: Mayford Knife, [name], Domenic Polite    HIATAL HERNIA 12/15/2007   Qualifier: Diagnosis of  By: Mayford Knife, [name], [name]    Hypertension    MAMMOGRAM, ABNORMAL 08/01/2008   Qualifier: Diagnosis of  By: Mayford Knife [name], Domenic Polite    RAYNAUD'S DISEASE 12/15/2007   Qualifier: Diagnosis of  By: Mayford Knife [name], Domenic Polite     Past Surgical History:  Procedure Laterality Date   CHOLECYSTECTOMY     KNEE SURGERY  1994   trauma to aerm in machine   1996    Social History:  reports that she has never smoked. She has never used smokeless tobacco. She reports that she does not drink alcohol and does not use drugs.  Allergies: No Known Allergies  Family History:  Family History  Problem Relation Age of Onset   Diabetes  Mother    Hypertension Father      Current Outpatient Medications:    amLODipine (NORVASC) 5 MG tablet, Take 1 tablet (5 mg total) by mouth daily., Disp: 90 tablet, Rfl: 1   Calcium Carb-Cholecalciferol (CALCIUM 500 + D PO), Take by mouth., Disp: , Rfl:    Multiple Vitamin (MULTIVITAMIN) capsule, Take 1 capsule by mouth daily., Disp: , Rfl:   Review of Systems:  Constitutional: Denies fever, chills, diaphoresis, appetite change and fatigue.  HEENT: Denies photophobia, eye pain, redness, hearing loss, ear pain, congestion, sore throat, rhinorrhea, sneezing, mouth sores, trouble swallowing, neck pain, neck stiffness and tinnitus.   Respiratory: Denies SOB, DOE, cough, chest tightness,  and wheezing.   Cardiovascular: Denies chest pain, palpitations and leg swelling.  Gastrointestinal: Denies nausea, vomiting, abdominal pain, diarrhea, constipation, blood in stool and abdominal distention.  Genitourinary: Denies dysuria, urgency, frequency, hematuria, flank pain and difficulty urinating.  Endocrine: Denies: hot or cold intolerance, sweats, changes in hair or nails, polyuria, polydipsia. Musculoskeletal: Denies myalgias, back pain, joint swelling, arthralgias and gait problem.  Skin: Denies pallor, rash and wound.  Neurological: Denies dizziness, seizures, syncope, weakness, light-headedness, numbness and headaches.  Hematological: Denies adenopathy. Easy bruising, personal or family bleeding history  Psychiatric/Behavioral: Denies suicidal ideation, mood changes, confusion, nervousness, sleep disturbance and agitation    Physical Exam: Vitals:   02/11/22 0941 02/11/22 1001  BP: (!) 150/100 (!) 145/92  Pulse: 72   Temp: 98.3 F (36.8 C)   TempSrc: Oral   SpO2: 98%   Weight: 209 lb (94.8 kg)   Height: 5\' 6"  (1.676 m)     Body mass index is 33.73 kg/m.   Constitutional: NAD, calm, comfortable Eyes: PERRL, lids and conjunctivae normal ENMT: Mucous membranes are  moist. Respiratory: clear to auscultation bilaterally, no wheezing, no crackles. Normal respiratory effort. No accessory muscle use.  Cardiovascular: Regular rate and rhythm, no murmurs / rubs / gallops. No extremity edema.  Psychiatric: Normal judgment and insight. Alert and oriented x 3. Normal mood.    Impression and Plan:  Primary hypertension - Plan: amLODipine (NORVASC) 5 MG tablet  Hospital discharge follow-up  -Emergency department records have been reviewed in great detail. -I will go ahead and make a diagnosis of hypertension today and start her on amlodipine 5 mg daily. -She will follow-up with me in 6 to 8 weeks.  Time spent:31 minutes reviewing chart, interviewing and examining patient and formulating plan of care.   Patient Instructions  -Nice seeing you today!!  -Start amlodipine 5 mg daily.  -Schedule follow up for your BP in 6-8 weeks.    , MD [facility]

## 2022-03-04 DIAGNOSIS — H25811 Combined forms of age-related cataract, right eye: Secondary | ICD-10-CM | POA: Diagnosis not present

## 2022-03-04 DIAGNOSIS — H269 Unspecified cataract: Secondary | ICD-10-CM | POA: Diagnosis not present

## 2022-03-25 ENCOUNTER — Ambulatory Visit (INDEPENDENT_AMBULATORY_CARE_PROVIDER_SITE_OTHER): Payer: Medicare Other | Admitting: Internal Medicine

## 2022-03-25 ENCOUNTER — Encounter: Payer: Self-pay | Admitting: Internal Medicine

## 2022-03-25 VITALS — BP 130/84 | HR 79 | Temp 98.0°F | Wt 210.6 lb

## 2022-03-25 DIAGNOSIS — I10 Essential (primary) hypertension: Secondary | ICD-10-CM

## 2022-03-25 DIAGNOSIS — R7302 Impaired glucose tolerance (oral): Secondary | ICD-10-CM | POA: Diagnosis not present

## 2022-03-25 DIAGNOSIS — Z1211 Encounter for screening for malignant neoplasm of colon: Secondary | ICD-10-CM

## 2022-03-25 LAB — POCT GLYCOSYLATED HEMOGLOBIN (HGB A1C): Hemoglobin A1C: 5.9 % — AB (ref 4.0–5.6)

## 2022-03-25 NOTE — Progress Notes (Signed)
Established Patient Office Visit     CC/Reason for Visit: Follow-up chronic conditions  HPI: Holly Arellano is a 66 y.o. female who is coming in today for the above mentioned reasons. Past Medical History is significant for: Hypertension that is newly diagnosed, impaired glucose tolerance, hyperlipidemia, vitamin D deficiency and obesity.  She has been working hard on lifestyle changes.  She has been taking amlodipine 5 mg since her last visit.  She has no acute concerns or complaints.   Past Medical/Surgical History: Past Medical History:  Diagnosis Date   Chronic maxillary sinusitis 12/15/2007   Qualifier: Diagnosis of  By: Lovell Sheehan MD, John E    Cough variant asthma 10/19/2008   Qualifier: Diagnosis of  By: Lovell Sheehan MD, Balinda Quails    GERD (gastroesophageal reflux disease)    HEMORRHOIDS, INTERNAL 12/15/2007   Qualifier: Diagnosis of  By: Mayford Knife, [name], Domenic Polite    HIATAL HERNIA 12/15/2007   Qualifier: Diagnosis of  By: Mayford Knife, [name], [name]    Hypertension    MAMMOGRAM, ABNORMAL 08/01/2008   Qualifier: Diagnosis of  By: Mayford Knife [name], Domenic Polite    RAYNAUD'S DISEASE 12/15/2007   Qualifier: Diagnosis of  By: Mayford Knife [name], Domenic Polite     Past Surgical History:  Procedure Laterality Date   CHOLECYSTECTOMY     KNEE SURGERY  1994   trauma to aerm in machine   1996    Social History:  reports that she has never smoked. She has never used smokeless tobacco. She reports that she does not drink alcohol and does not use drugs.  Allergies: No Known Allergies  Family History:  Family History  Problem Relation Age of Onset   Diabetes Mother    Hypertension Father      Current Outpatient Medications:    amLODipine (NORVASC) 5 MG tablet, Take 1 tablet (5 mg total) by mouth daily., Disp: 90 tablet, Rfl: 1   Calcium Carb-Cholecalciferol (CALCIUM 500 + D PO), Take by mouth., Disp: , Rfl:    Multiple Vitamin (MULTIVITAMIN) capsule, Take 1 capsule by mouth daily., Disp: , Rfl:    Review of Systems:  Constitutional: Denies fever, chills, diaphoresis, appetite change and fatigue.  HEENT: Denies photophobia, eye pain, redness, hearing loss, ear pain, congestion, sore throat, rhinorrhea, sneezing, mouth sores, trouble swallowing, neck pain, neck stiffness and tinnitus.   Respiratory: Denies SOB, DOE, cough, chest tightness,  and wheezing.   Cardiovascular: Denies chest pain, palpitations and leg swelling.  Gastrointestinal: Denies nausea, vomiting, abdominal pain, diarrhea, constipation, blood in stool and abdominal distention.  Genitourinary: Denies dysuria, urgency, frequency, hematuria, flank pain and difficulty urinating.  Endocrine: Denies: hot or cold intolerance, sweats, changes in hair or nails, polyuria, polydipsia. Musculoskeletal: Denies myalgias, back pain, joint swelling, arthralgias and gait problem.  Skin: Denies pallor, rash and wound.  Neurological: Denies dizziness, seizures, syncope, weakness, light-headedness, numbness and headaches.  Hematological: Denies adenopathy. Easy bruising, personal or family bleeding history  Psychiatric/Behavioral: Denies suicidal ideation, mood changes, confusion, nervousness, sleep disturbance and agitation    Physical Exam: Vitals:   03/25/22 0855  BP: 130/84  Pulse: 79  Temp: 98 F (36.7 C)  TempSrc: Oral  SpO2: 97%  Weight: 210 lb 9.6 oz (95.5 kg)    Body mass index is 33.99 kg/m.   Constitutional: NAD, calm, comfortable Eyes: PERRL, lids and conjunctivae normal ENMT: Mucous membranes are moist.  Respiratory: clear to auscultation bilaterally, no wheezing, no crackles. Normal respiratory effort. No accessory muscle use.  Cardiovascular: Regular rate and rhythm, no murmurs / rubs / gallops. No extremity edema.  Psychiatric: Normal judgment and insight. Alert and oriented x 3. Normal mood.    Impression and Plan:  IGT (impaired glucose tolerance)  - Plan: POCT glycosylated hemoglobin (Hb A1C) -A1c  is stable at 5.9.  Screening for malignant neoplasm of colon  - Plan: Ambulatory referral to Gastroenterology  Primary hypertension -Blood pressure is improved although still not at goal, she has a physical in the next few weeks, if blood pressure remains elevated then, will likely add another medication.    Time spent:30 minutes reviewing chart, interviewing and examining patient and formulating plan of care.     Chaya Jan, MD [facility]

## 2022-04-01 DIAGNOSIS — H524 Presbyopia: Secondary | ICD-10-CM | POA: Diagnosis not present

## 2022-04-22 ENCOUNTER — Ambulatory Visit: Payer: Medicare Other | Admitting: Internal Medicine

## 2022-04-22 ENCOUNTER — Ambulatory Visit: Payer: Self-pay | Admitting: Internal Medicine

## 2022-05-25 LAB — GLUCOSE, POCT (MANUAL RESULT ENTRY): POC Glucose: 86 mg/dl (ref 70–99)

## 2022-06-25 ENCOUNTER — Encounter: Payer: Self-pay | Admitting: Internal Medicine

## 2022-06-25 ENCOUNTER — Ambulatory Visit (INDEPENDENT_AMBULATORY_CARE_PROVIDER_SITE_OTHER): Payer: Medicare Other | Admitting: Internal Medicine

## 2022-06-25 ENCOUNTER — Other Ambulatory Visit: Payer: Self-pay | Admitting: Internal Medicine

## 2022-06-25 VITALS — BP 128/79 | HR 82 | Temp 98.4°F | Ht 66.0 in | Wt 211.3 lb

## 2022-06-25 DIAGNOSIS — E559 Vitamin D deficiency, unspecified: Secondary | ICD-10-CM | POA: Diagnosis not present

## 2022-06-25 DIAGNOSIS — I10 Essential (primary) hypertension: Secondary | ICD-10-CM | POA: Diagnosis not present

## 2022-06-25 DIAGNOSIS — R7302 Impaired glucose tolerance (oral): Secondary | ICD-10-CM | POA: Diagnosis not present

## 2022-06-25 DIAGNOSIS — E785 Hyperlipidemia, unspecified: Secondary | ICD-10-CM

## 2022-06-25 LAB — COMPREHENSIVE METABOLIC PANEL
ALT: 10 U/L (ref 0–35)
AST: 12 U/L (ref 0–37)
Albumin: 4.2 g/dL (ref 3.5–5.2)
Alkaline Phosphatase: 73 U/L (ref 39–117)
BUN: 17 mg/dL (ref 6–23)
CO2: 28 mEq/L (ref 19–32)
Calcium: 9.2 mg/dL (ref 8.4–10.5)
Chloride: 108 mEq/L (ref 96–112)
Creatinine, Ser: 0.89 mg/dL (ref 0.40–1.20)
GFR: 67.69 mL/min (ref >60.00)
Glucose, Bld: 105 mg/dL — ABNORMAL HIGH (ref 70–99)
Potassium: 4 mEq/L (ref 3.5–5.1)
Sodium: 143 mEq/L (ref 135–145)
Total Bilirubin: 0.6 mg/dL (ref 0.2–1.2)
Total Protein: 6.8 g/dL (ref 6.0–8.3)

## 2022-06-25 LAB — CBC WITH DIFFERENTIAL/PLATELET
Basophils Absolute: 0 10*3/uL (ref 0.0–0.1)
Basophils Relative: 0.6 % (ref 0.0–3.0)
Eosinophils Absolute: 0.1 10*3/uL (ref 0.0–0.7)
Eosinophils Relative: 2.6 % (ref 0.0–5.0)
HCT: 39.8 % (ref 36.0–46.0)
Hemoglobin: 13.5 g/dL (ref 12.0–15.0)
Lymphocytes Relative: 38.4 % (ref 12.0–46.0)
Lymphs Abs: 1.4 10*3/uL (ref 0.7–4.0)
MCHC: 33.9 g/dL (ref 30.0–36.0)
MCV: 85.7 fl (ref 78.0–100.0)
Monocytes Absolute: 0.3 10*3/uL (ref 0.1–1.0)
Monocytes Relative: 7.3 % (ref 3.0–12.0)
Neutro Abs: 1.9 10*3/uL (ref 1.4–7.7)
Neutrophils Relative %: 51.1 % (ref 43.0–77.0)
Platelets: 198 10*3/uL (ref 150.0–400.0)
RBC: 4.65 Mil/uL (ref 3.87–5.11)
RDW: 13.5 % (ref 11.5–15.5)
WBC: 3.7 10*3/uL — ABNORMAL LOW (ref 4.0–10.5)

## 2022-06-25 LAB — POCT GLYCOSYLATED HEMOGLOBIN (HGB A1C): Hemoglobin A1C: 5.9 % — AB (ref 4.0–5.6)

## 2022-06-25 LAB — LIPID PANEL
Cholesterol: 187 mg/dL (ref 0–200)
HDL: 42 mg/dL (ref >39.00)
LDL Cholesterol: 128 mg/dL — ABNORMAL HIGH (ref 0–99)
NonHDL: 144.55
Total CHOL/HDL Ratio: 4
Triglycerides: 84 mg/dL (ref 0.0–149.0)
VLDL: 16.8 mg/dL (ref 0.0–40.0)

## 2022-06-25 LAB — VITAMIN D 25 HYDROXY (VIT D DEFICIENCY, FRACTURES): VITD: 27.89 ng/mL — ABNORMAL LOW (ref 30.00–100.00)

## 2022-06-25 MED ORDER — VITAMIN D (ERGOCALCIFEROL) 1.25 MG (50000 UNIT) PO CAPS: 50000.0000 [IU] | ORAL_CAPSULE | ORAL | 0 refills | Status: DC

## 2022-06-25 MED ORDER — AMLODIPINE BESYLATE 5 MG PO TABS: 5.0000 mg | ORAL_TABLET | Freq: Every day | ORAL | 1 refills | Status: DC

## 2022-06-25 NOTE — Progress Notes (Signed)
Established Patient Office Visit     CC/Reason for Visit: Follow-up chronic medical conditions  HPI: Holly Arellano is a 66 y.o. female who is coming in today for the above mentioned reasons. Past Medical History is significant for: Hypertension, hyperlipidemia, impaired glucose tolerance, obesity, vitamin D deficiency.  She has been feeling well.  She has been doing ambulatory blood pressure measurements that I will insert below.  She has no concerns or complaints.        Past Medical/Surgical History: Past Medical History:  Diagnosis Date   Chronic maxillary sinusitis 12/15/2007   Qualifier: Diagnosis of  By: Lovell Sheehan MD, John E    Cough variant asthma 10/19/2008   Qualifier: Diagnosis of  By: Lovell Sheehan MD, Balinda Quails    GERD (gastroesophageal reflux disease)    HEMORRHOIDS, INTERNAL 12/15/2007   Qualifier: Diagnosis of  By: Mayford Knife, [name], Domenic Polite    HIATAL HERNIA 12/15/2007   Qualifier: Diagnosis of  By: Mayford Knife, [name], [name]    Hypertension    MAMMOGRAM, ABNORMAL 08/01/2008   Qualifier: Diagnosis of  By: Mayford Knife [name], Domenic Polite    RAYNAUD'S DISEASE 12/15/2007   Qualifier: Diagnosis of  By: Mayford Knife [name], Domenic Polite     Past Surgical History:  Procedure Laterality Date   CHOLECYSTECTOMY     KNEE SURGERY  1994   trauma to aerm in machine   1996    Social History:  reports that she has never smoked. She has never used smokeless tobacco. She reports that she does not drink alcohol and does not use drugs.  Allergies: No Known Allergies  Family History:  Family History  Problem Relation Age of Onset   Diabetes Mother    Hypertension Father      Current Outpatient Medications:    Calcium Carb-Cholecalciferol (CALCIUM 500 + D PO), Take by mouth., Disp: , Rfl:    Multiple Vitamin (MULTIVITAMIN) capsule, Take 1 capsule by mouth daily., Disp: , Rfl:    amLODipine (NORVASC) 5 MG tablet, Take 1 tablet (5 mg total) by mouth daily., Disp: 90 tablet, Rfl: 1  Review of  Systems:  Constitutional: Denies fever, chills, diaphoresis, appetite change and fatigue.  HEENT: Denies photophobia, eye pain, redness, hearing loss, ear pain, congestion, sore throat, rhinorrhea, sneezing, mouth sores, trouble swallowing, neck pain, neck stiffness and tinnitus.   Respiratory: Denies SOB, DOE, cough, chest tightness,  and wheezing.   Cardiovascular: Denies chest pain, palpitations and leg swelling.  Gastrointestinal: Denies nausea, vomiting, abdominal pain, diarrhea, constipation, blood in stool and abdominal distention.  Genitourinary: Denies dysuria, urgency, frequency, hematuria, flank pain and difficulty urinating.  Endocrine: Denies: hot or cold intolerance, sweats, changes in hair or nails, polyuria, polydipsia. Musculoskeletal: Denies myalgias, back pain, joint swelling, arthralgias and gait problem.  Skin: Denies pallor, rash and wound.  Neurological: Denies dizziness, seizures, syncope, weakness, light-headedness, numbness and headaches.  Hematological: Denies adenopathy. Easy bruising, personal or family bleeding history  Psychiatric/Behavioral: Denies suicidal ideation, mood changes, confusion, nervousness, sleep disturbance and agitation    Physical Exam: Vitals:   06/25/22 0802 06/25/22 0804 06/25/22 0826  BP: (!) 140/84 (!) 152/82 128/79  Pulse: 82    Temp: 98.4 F (36.9 C)    TempSrc: Oral    SpO2: 98%    Weight: 211 lb 4.8 oz (95.8 kg)    Height: 5\' 6"  (1.676 m)      Body mass index is 34.1 kg/m.    Constitutional: NAD, calm, comfortable Eyes: PERRL, lids  and conjunctivae normal ENMT: Mucous membranes are moist.  Respiratory: clear to auscultation bilaterally, no wheezing, no crackles. Normal respiratory effort. No accessory muscle use.  Cardiovascular: Regular rate and rhythm, no murmurs / rubs / gallops. No extremity edema. Psychiatric: Normal judgment and insight. Alert and oriented x 3. Normal mood.    Impression and Plan:  IGT  (impaired glucose tolerance) - Plan: POCT glycosylated hemoglobin (Hb A1C)  Primary hypertension - Plan: amLODipine (NORVASC) 5 MG tablet, CBC with Differential/Platelet, Comprehensive metabolic panel, Comprehensive metabolic panel, CBC with Differential/Platelet  Hyperlipidemia, unspecified hyperlipidemia type - Plan: Lipid panel, Lipid panel  Vitamin D deficiency - Plan: VITAMIN D 25 Hydroxy (Vit-D Deficiency, Fractures), VITAMIN D 25 Hydroxy (Vit-D Deficiency, Fractures)  -A1c remains in the prediabetic range at 5.9, we have extensively discussed lifestyle changes including incorporating some physical activity. -Blood pressure was initially elevated but came down towards the end of the visit, also her ambulatory measurements are mostly within range. -Continue amlodipine 5 mg only for now and return in 3 months for follow-up. -Follow lipid and vitamin D levels.   Time spent:31 minutes reviewing chart, interviewing and examining patient and formulating plan of care.       Lelon Frohlich, MD [place] [facility]

## 2022-06-26 ENCOUNTER — Other Ambulatory Visit: Payer: Self-pay | Admitting: *Deleted

## 2022-06-26 DIAGNOSIS — E785 Hyperlipidemia, unspecified: Secondary | ICD-10-CM

## 2022-06-26 DIAGNOSIS — E559 Vitamin D deficiency, unspecified: Secondary | ICD-10-CM

## 2022-08-08 ENCOUNTER — Other Ambulatory Visit: Payer: Self-pay | Admitting: Internal Medicine

## 2022-08-08 DIAGNOSIS — I10 Essential (primary) hypertension: Secondary | ICD-10-CM

## 2022-09-16 ENCOUNTER — Other Ambulatory Visit: Payer: Self-pay | Admitting: Internal Medicine

## 2022-09-16 DIAGNOSIS — E559 Vitamin D deficiency, unspecified: Secondary | ICD-10-CM

## 2022-09-20 LAB — HM MAMMOGRAPHY

## 2022-09-24 ENCOUNTER — Encounter: Payer: Self-pay | Admitting: Internal Medicine

## 2022-09-25 ENCOUNTER — Ambulatory Visit (INDEPENDENT_AMBULATORY_CARE_PROVIDER_SITE_OTHER): Payer: BLUE CROSS/BLUE SHIELD | Admitting: Internal Medicine

## 2022-09-25 ENCOUNTER — Encounter: Payer: Self-pay | Admitting: Internal Medicine

## 2022-09-25 VITALS — BP 120/84 | HR 75 | Temp 98.3°F | Wt 207.9 lb

## 2022-09-25 DIAGNOSIS — R739 Hyperglycemia, unspecified: Secondary | ICD-10-CM | POA: Diagnosis not present

## 2022-09-25 DIAGNOSIS — E785 Hyperlipidemia, unspecified: Secondary | ICD-10-CM

## 2022-09-25 DIAGNOSIS — R7302 Impaired glucose tolerance (oral): Secondary | ICD-10-CM

## 2022-09-25 DIAGNOSIS — E559 Vitamin D deficiency, unspecified: Secondary | ICD-10-CM | POA: Diagnosis not present

## 2022-09-25 DIAGNOSIS — E782 Mixed hyperlipidemia: Secondary | ICD-10-CM | POA: Diagnosis not present

## 2022-09-25 DIAGNOSIS — I10 Essential (primary) hypertension: Secondary | ICD-10-CM

## 2022-09-25 LAB — LIPID PANEL
Cholesterol: 226 mg/dL — ABNORMAL HIGH (ref 0–200)
HDL: 42.4 mg/dL (ref >39.00)
LDL Cholesterol: 160 mg/dL — ABNORMAL HIGH (ref 0–99)
NonHDL: 183.93
Total CHOL/HDL Ratio: 5
Triglycerides: 121 mg/dL (ref 0.0–149.0)
VLDL: 24.2 mg/dL (ref 0.0–40.0)

## 2022-09-25 LAB — POCT GLYCOSYLATED HEMOGLOBIN (HGB A1C): Hemoglobin A1C: 6 % — AB (ref 4.0–5.6)

## 2022-09-25 LAB — VITAMIN D 25 HYDROXY (VIT D DEFICIENCY, FRACTURES): VITD: 46.13 ng/mL (ref 30.00–100.00)

## 2022-09-25 NOTE — Progress Notes (Signed)
Established Patient Office Visit     CC/Reason for Visit: Follow-up chronic conditions  HPI: Holly Arellano is a 67 y.o. female who is coming in today for the above mentioned reasons. Past Medical History is significant for: Hypertension, hyperlipidemia, impaired glucose tolerance, vitamin D deficiency, obesity.  She feels well today.  Unfortunately she lost her grandson who was born prematurely a couple months ago.  She is overdue for all seasonal respiratory vaccines.  She is feeling well and has no acute complaints.   Past Medical/Surgical History: Past Medical History:  Diagnosis Date   Chronic maxillary sinusitis 12/15/2007   Qualifier: Diagnosis of  By: Arnoldo Morale MD, John E    Cough variant asthma 10/19/2008   Qualifier: Diagnosis of  By: Arnoldo Morale MD, Balinda Quails    GERD (gastroesophageal reflux disease)    HEMORRHOIDS, INTERNAL 12/15/2007   Qualifier: Diagnosis of  By: Jimmye Norman, [name], Winfield Cunas    HIATAL HERNIA 12/15/2007   Qualifier: Diagnosis of  By: Jimmye Norman, [name], [name]    Hypertension    MAMMOGRAM, ABNORMAL 08/01/2008   Qualifier: Diagnosis of  By: Jimmye Norman [name], Winfield Cunas    RAYNAUD'S DISEASE 12/15/2007   Qualifier: Diagnosis of  By: Jimmye Norman [name], Winfield Cunas     Past Surgical History:  Procedure Laterality Date   CHOLECYSTECTOMY     KNEE SURGERY  1994   trauma to aerm in machine   1996    Social History:  reports that she has never smoked. She has never used smokeless tobacco. She reports that she does not drink alcohol and does not use drugs.  Allergies: No Known Allergies  Family History:  Family History  Problem Relation Age of Onset   Diabetes Mother    Hypertension Father      Current Outpatient Medications:    amLODipine (NORVASC) 5 MG tablet, TAKE 1 TABLET(5 MG) BY MOUTH DAILY, Disp: 90 tablet, Rfl: 1   Calcium Carb-Cholecalciferol (CALCIUM 500 + D PO), Take by mouth., Disp: , Rfl:    Multiple Vitamin (MULTIVITAMIN) capsule, Take 1 capsule by mouth  daily., Disp: , Rfl:    Vitamin D, Ergocalciferol, (DRISDOL) 1.25 MG (50000 UNIT) CAPS capsule, TAKE 1 CAPSULE BY MOUTH EVERY 7 DAYS FOR 12 DOSES, Disp: 12 capsule, Rfl: 0  Review of Systems:  Constitutional: Denies fever, chills, diaphoresis, appetite change and fatigue.  HEENT: Denies photophobia, eye pain, redness, hearing loss, ear pain, congestion, sore throat, rhinorrhea, sneezing, mouth sores, trouble swallowing, neck pain, neck stiffness and tinnitus.   Respiratory: Denies SOB, DOE, cough, chest tightness,  and wheezing.   Cardiovascular: Denies chest pain, palpitations and leg swelling.  Gastrointestinal: Denies nausea, vomiting, abdominal pain, diarrhea, constipation, blood in stool and abdominal distention.  Genitourinary: Denies dysuria, urgency, frequency, hematuria, flank pain and difficulty urinating.  Endocrine: Denies: hot or cold intolerance, sweats, changes in hair or nails, polyuria, polydipsia. Musculoskeletal: Denies myalgias, back pain, joint swelling, arthralgias and gait problem.  Skin: Denies pallor, rash and wound.  Neurological: Denies dizziness, seizures, syncope, weakness, light-headedness, numbness and headaches.  Hematological: Denies adenopathy. Easy bruising, personal or family bleeding history  Psychiatric/Behavioral: Denies suicidal ideation, mood changes, confusion, nervousness, sleep disturbance and agitation    Physical Exam: Vitals:   09/25/22 0801  BP: 120/84  Pulse: 75  Temp: 98.3 F (36.8 C)  TempSrc: Oral  SpO2: 96%  Weight: 207 lb 14.4 oz (94.3 kg)    Body mass index is 33.56 kg/m.   Constitutional: NAD, calm,  comfortable Eyes: PERRL, lids and conjunctivae normal ENMT: Mucous membranes are moist.  Respiratory: clear to auscultation bilaterally, no wheezing, no crackles. Normal respiratory effort. No accessory muscle use.  Cardiovascular: Regular rate and rhythm, no murmurs / rubs / gallops. No extremity edema.   Psychiatric: Normal  judgment and insight. Alert and oriented x 3. Normal mood.    Impression and Plan:  Hyperglycemia - Plan: POCT glycosylated hemoglobin (Hb A1C)  IGT (impaired glucose tolerance)  Mixed hyperlipidemia  Vitamin D deficiency - Plan: VITAMIN D 25 Hydroxy (Vit-D Deficiency, Fractures)  Hyperlipidemia, unspecified hyperlipidemia type - Plan: Lipid panel  Primary hypertension  -A1C of 6.0 remains in the prediabetic range albeit stable. -She is due for lipid and vitamin D rechecks today. -Blood pressure is relatively well-controlled at 120/84 today on amlodipine 5 mg. -We have discussed lifestyle changes at length today. -She has declined flu, COVID, RSV vaccinations today despite counseling.  Time spent:33 minutes reviewing chart, interviewing and examining patient and formulating plan of care.      Lelon Frohlich, MD [facility]

## 2022-09-26 ENCOUNTER — Other Ambulatory Visit: Payer: Self-pay | Admitting: *Deleted

## 2022-09-26 DIAGNOSIS — E782 Mixed hyperlipidemia: Secondary | ICD-10-CM

## 2022-09-26 MED ORDER — ATORVASTATIN CALCIUM 20 MG PO TABS: 20.0000 mg | ORAL_TABLET | Freq: Every day | ORAL | 1 refills | Status: DC

## 2022-11-06 DIAGNOSIS — K080 Exfoliation of teeth due to systemic causes: Secondary | ICD-10-CM | POA: Diagnosis not present

## 2022-12-10 DIAGNOSIS — K080 Exfoliation of teeth due to systemic causes: Secondary | ICD-10-CM | POA: Diagnosis not present

## 2022-12-25 ENCOUNTER — Encounter: Payer: Self-pay | Admitting: Internal Medicine

## 2022-12-25 ENCOUNTER — Ambulatory Visit (INDEPENDENT_AMBULATORY_CARE_PROVIDER_SITE_OTHER): Payer: Medicare Other | Admitting: Internal Medicine

## 2022-12-25 VITALS — BP 135/67 | HR 76 | Temp 98.6°F | Wt 205.6 lb

## 2022-12-25 DIAGNOSIS — R739 Hyperglycemia, unspecified: Secondary | ICD-10-CM

## 2022-12-25 DIAGNOSIS — R7302 Impaired glucose tolerance (oral): Secondary | ICD-10-CM | POA: Diagnosis not present

## 2022-12-25 DIAGNOSIS — E559 Vitamin D deficiency, unspecified: Secondary | ICD-10-CM | POA: Diagnosis not present

## 2022-12-25 DIAGNOSIS — E782 Mixed hyperlipidemia: Secondary | ICD-10-CM | POA: Diagnosis not present

## 2022-12-25 DIAGNOSIS — E6609 Other obesity due to excess calories: Secondary | ICD-10-CM | POA: Diagnosis not present

## 2022-12-25 DIAGNOSIS — Z6833 Body mass index (BMI) 33.0-33.9, adult: Secondary | ICD-10-CM

## 2022-12-25 LAB — POCT GLYCOSYLATED HEMOGLOBIN (HGB A1C): Hemoglobin A1C: 5.9 % — AB (ref 4.0–5.6)

## 2022-12-25 MED ORDER — ATORVASTATIN CALCIUM 20 MG PO TABS: 20.0000 mg | ORAL_TABLET | Freq: Every day | ORAL | 1 refills | Status: DC

## 2022-12-25 NOTE — Progress Notes (Signed)
Established Patient Office Visit     CC/Reason for Visit: Follow-up chronic conditions  HPI: Holly Arellano is a 67 y.o. female who is coming in today for the above mentioned reasons. Past Medical History is significant for: Hypertension, hyperlipidemia, impaired glucose tolerance, vitamin D deficiency, obesity.  She is feeling well.  Has had a lot of anxiety with a very ill brother.  She has not yet started atorvastatin that was prescribed at last visit.   Past Medical/Surgical History: Past Medical History:  Diagnosis Date   Chronic maxillary sinusitis 12/15/2007   Qualifier: Diagnosis of  By: Arnoldo Morale MD, John E    Cough variant asthma 10/19/2008   Qualifier: Diagnosis of  By: Arnoldo Morale MD, Balinda Quails    GERD (gastroesophageal reflux disease)    HEMORRHOIDS, INTERNAL 12/15/2007   Qualifier: Diagnosis of  By: Jimmye Norman, [name], Winfield Cunas    HIATAL HERNIA 12/15/2007   Qualifier: Diagnosis of  By: Jimmye Norman, [name], [name]    Hypertension    MAMMOGRAM, ABNORMAL 08/01/2008   Qualifier: Diagnosis of  By: Jimmye Norman [name], Winfield Cunas    RAYNAUD'S DISEASE 12/15/2007   Qualifier: Diagnosis of  By: Jimmye Norman [name], Winfield Cunas     Past Surgical History:  Procedure Laterality Date   CHOLECYSTECTOMY     KNEE SURGERY  1994   trauma to aerm in machine   1996    Social History:  reports that she has never smoked. She has never used smokeless tobacco. She reports that she does not drink alcohol and does not use drugs.  Allergies: No Known Allergies  Family History:  Family History  Problem Relation Age of Onset   Diabetes Mother    Hypertension Father      Current Outpatient Medications:    amLODipine (NORVASC) 5 MG tablet, TAKE 1 TABLET(5 MG) BY MOUTH DAILY, Disp: 90 tablet, Rfl: 1   Calcium Carb-Cholecalciferol (CALCIUM 500 + D PO), Take by mouth., Disp: , Rfl:    Multiple Vitamin (MULTIVITAMIN) capsule, Take 1 capsule by mouth daily., Disp: , Rfl:    Vitamin D, Ergocalciferol, (DRISDOL) 1.25  MG (50000 UNIT) CAPS capsule, TAKE 1 CAPSULE BY MOUTH EVERY 7 DAYS FOR 12 DOSES, Disp: 12 capsule, Rfl: 0   atorvastatin (LIPITOR) 20 MG tablet, Take 1 tablet (20 mg total) by mouth at bedtime., Disp: 90 tablet, Rfl: 1  Review of Systems:  Negative unless indicated in HPI.   Physical Exam: Vitals:   12/25/22 0727 12/25/22 0729  BP: 138/85 135/67  Pulse: 76   Temp: 98.6 F (37 C)   TempSrc: Oral   SpO2: 98%   Weight: 205 lb 9.6 oz (93.3 kg)     Body mass index is 33.18 kg/m.   Physical Exam Vitals reviewed.  Constitutional:      Appearance: Normal appearance.  HENT:     Head: Normocephalic and atraumatic.  Eyes:     Conjunctiva/sclera: Conjunctivae normal.     Pupils: Pupils are equal, round, and reactive to light.  Cardiovascular:     Rate and Rhythm: Normal rate and regular rhythm.  Pulmonary:     Effort: Pulmonary effort is normal.     Breath sounds: Normal breath sounds.  Skin:    General: Skin is warm and dry.  Neurological:     General: No focal deficit present.     Mental Status: She is alert and oriented to person, place, and time.  Psychiatric:        Mood and  Affect: Mood normal.        Behavior: Behavior normal.        Thought Content: Thought content normal.        Judgment: Judgment normal.      Impression and Plan:  IGT (impaired glucose tolerance) - Plan: POC HgB A1c  Mixed hyperlipidemia  Class 1 obesity due to excess calories with serious comorbidity and body mass index (BMI) of 33.0 to 33.9 in adult  Vitamin D deficiency  -A1c remains stable in the prediabetic range at 5.9. -Blood pressure is fairly well-controlled. -Unfortunately, she never started the atorvastatin.  She will start now and return in 3 to 4 months to recheck lipids. -We have discussed lifestyle modifications.  Time spent:31 minutes reviewing chart, interviewing and examining patient and formulating plan of care.     Lelon Frohlich, MD [facility]

## 2022-12-25 NOTE — Addendum Note (Signed)
Addended by: Erline Hau on: 12/25/2022 08:44 AM   Modules accepted: Level of Service

## 2023-01-21 DIAGNOSIS — K080 Exfoliation of teeth due to systemic causes: Secondary | ICD-10-CM | POA: Diagnosis not present

## 2023-02-01 DIAGNOSIS — Y9301 Activity, walking, marching and hiking: Secondary | ICD-10-CM | POA: Diagnosis not present

## 2023-02-01 DIAGNOSIS — I10 Essential (primary) hypertension: Secondary | ICD-10-CM | POA: Diagnosis not present

## 2023-02-01 DIAGNOSIS — S8252XA Displaced fracture of medial malleolus of left tibia, initial encounter for closed fracture: Secondary | ICD-10-CM | POA: Diagnosis not present

## 2023-02-01 DIAGNOSIS — S8262XA Displaced fracture of lateral malleolus of left fibula, initial encounter for closed fracture: Secondary | ICD-10-CM | POA: Diagnosis not present

## 2023-02-01 DIAGNOSIS — M25572 Pain in left ankle and joints of left foot: Secondary | ICD-10-CM | POA: Diagnosis not present

## 2023-02-01 DIAGNOSIS — X501XXA Overexertion from prolonged static or awkward postures, initial encounter: Secondary | ICD-10-CM | POA: Diagnosis not present

## 2023-02-01 DIAGNOSIS — W108XXA Fall (on) (from) other stairs and steps, initial encounter: Secondary | ICD-10-CM | POA: Diagnosis not present

## 2023-02-01 DIAGNOSIS — S82852A Displaced trimalleolar fracture of left lower leg, initial encounter for closed fracture: Secondary | ICD-10-CM | POA: Diagnosis not present

## 2023-02-02 DIAGNOSIS — R001 Bradycardia, unspecified: Secondary | ICD-10-CM | POA: Diagnosis not present

## 2023-02-02 DIAGNOSIS — S82852A Displaced trimalleolar fracture of left lower leg, initial encounter for closed fracture: Secondary | ICD-10-CM | POA: Diagnosis not present

## 2023-02-02 DIAGNOSIS — R9431 Abnormal electrocardiogram [ECG] [EKG]: Secondary | ICD-10-CM | POA: Diagnosis not present

## 2023-02-03 ENCOUNTER — Telehealth: Payer: Self-pay | Admitting: Internal Medicine

## 2023-02-03 DIAGNOSIS — M79605 Pain in left leg: Secondary | ICD-10-CM

## 2023-02-03 NOTE — Telephone Encounter (Addendum)
Pt called back, and she is very upset and was yelling.  Pt says she has been "waiting all day" for a call back from MD.   In reality, Pt has only been waiting 3 hrs, and was reminded that MD is currently seeing patients.  Pt was informed that MD would respond to her call, as soon as she was available.   Pt hung up.

## 2023-02-03 NOTE — Telephone Encounter (Signed)
Referral placed.

## 2023-02-03 NOTE — Telephone Encounter (Signed)
Pt called to say she has fractures on both sides of her left leg after a fall.  Pt was offered an OV, but states she just wanted to ask:  Pt would like to know if MD needs to see her, or can she just have an Ortho referral ?  Please advise.

## 2023-02-04 ENCOUNTER — Ambulatory Visit (INDEPENDENT_AMBULATORY_CARE_PROVIDER_SITE_OTHER): Payer: Medicare Other | Admitting: Orthopedic Surgery

## 2023-02-04 ENCOUNTER — Encounter (HOSPITAL_COMMUNITY): Payer: Self-pay | Admitting: Orthopedic Surgery

## 2023-02-04 ENCOUNTER — Encounter: Payer: Self-pay | Admitting: Orthopedic Surgery

## 2023-02-04 ENCOUNTER — Other Ambulatory Visit (INDEPENDENT_AMBULATORY_CARE_PROVIDER_SITE_OTHER): Payer: Medicare Other

## 2023-02-04 ENCOUNTER — Other Ambulatory Visit: Payer: Self-pay

## 2023-02-04 DIAGNOSIS — S82842A Displaced bimalleolar fracture of left lower leg, initial encounter for closed fracture: Secondary | ICD-10-CM

## 2023-02-04 DIAGNOSIS — M25572 Pain in left ankle and joints of left foot: Secondary | ICD-10-CM

## 2023-02-04 NOTE — Progress Notes (Signed)
SDW call  Patient was given pre-op instructions over the phone. Patient verbalized understanding of instructions provided.    PCP - Dr. Darlina Sicilian Cardiologist - Denies Pulmonary: Denies   PPM/ICD - Denies  Chest x-ray - n/a EKG -  DOS 02/05/2023 Stress Test - ECHO -  Cardiac Cath -   Sleep Study/sleep apnea/CPAP: Denies  Non-diabetic  Blood Thinner Instructions: Denies  Aspirin Instructions: Denies   ERAS Protcol - Yes, clear fluids until 0830 PRE-SURGERY Ensure or G2-    COVID TEST- n/a    Anesthesia review: No   Patient denies shortness of breath, fever, cough and chest pain over the phone call  Your procedure is scheduled on Wednesday Feb 05, 2023  Report to [facility] Main Entrance "A" at  0900  A.M., then check in with the Admitting office.  Call this number if you have problems the morning of surgery:  [phone]   If you have any questions prior to your surgery date call [phone]: Open Monday-Friday 8am-4pm If you experience any cold or flu symptoms such as cough, fever, chills, shortness of breath, etc. between now and your scheduled surgery, please notify us at the above number    Remember:  Do not eat after midnight the night before your surgery  You may drink clear liquids until 0830    the morning of your surgery.   Clear liquids allowed are: Water, Non-Citrus Juices (without pulp), Carbonated Beverages, Clear Tea, Black Coffee ONLY (NO MILK, CREAM OR POWDERED CREAMER of any kind), and Gatorade   Take these medicines the morning of surgery with A SIP OF WATER:  Amlodipine  As needed: Zofran, perocet  As of today, STOP taking any Aspirin (unless otherwise instructed by your surgeon) Aleve, Naproxen, Ibuprofen, Motrin, Advil, Goody's, BC's, all herbal medications, fish oil, and all vitamins.

## 2023-02-04 NOTE — Progress Notes (Signed)
Office Visit Note   Patient: Holly Arellano           Date of Birth: [birth year]           MRN: [id number] Visit Date: 02/04/2023              Requested by: Philip Aspen, Limmie Patricia, MD [address] [place],  [place] [address] PCP: Philip Aspen, Limmie Patricia, MD  Chief Complaint  Patient presents with   Left Ankle - Pain    DOI 02/01/2023      HPI: Patient is a 67 year old woman who is seen for initial evaluation for displaced bimalleolar left ankle fracture.  Patient states that she missed the step and rolled her ankle while [place] the beach on [place].  She was placed in a splint and is seen today for initial evaluation.  Assessment & Plan: Visit Diagnoses:  1. Pain in left ankle and joints of left foot   2. Closed bimalleolar fracture of left ankle, initial encounter     Plan: Will plan for open reduction internal fixation of the bimalleolar left ankle fracture.  Risk and benefits were discussed including infection neurovascular injury DVT nonhealing the wound nonhealing the bone risk for arthritis need for additional surgery.  Patient states she understands wished to proceed at this time for outpatient surgery tomorrow  Follow-Up Instructions: No follow-ups on file.   Ortho Exam  Patient is alert, oriented, no adenopathy, well-dressed, normal affect, normal respiratory effort. Examination patient is a good dorsalis pedis pulse the skin is intact no blisters no ulcers.  Hemoglobin A1c mildly elevated at 5.9.  Negative history for tobacco use.  Imaging: XR Ankle Complete Left  Result Date: 02/04/2023 Examination patient has a displaced bimalleolar left ankle fracture.  No images are attached to the encounter.  Labs: Lab Results  Component Value Date   HGBA1C 5.9 (A) 12/25/2022   HGBA1C 6.0 (A) 09/25/2022   HGBA1C 5.9 (A) 06/25/2022   LABORGA Normal Upper Respiratory Flora 05/18/2013   LABORGA No Beta Hemolytic Streptococci Isolated 05/18/2013     Lab  Results  Component Value Date   ALBUMIN 4.2 06/25/2022   ALBUMIN 4.2 02/07/2022   ALBUMIN 4.2 07/02/2021    No results found for: "MG" Lab Results  Component Value Date   VD25OH 46.13 09/25/2022   VD25OH 27.89 (L) 06/25/2022   VD25OH 43.37 10/23/2021    No results found for: "PREALBUMIN"    Latest Ref Rng & Units 06/25/2022    8:27 AM 02/07/2022   11:59 PM 07/02/2021    7:18 AM  CBC EXTENDED  WBC 4.0 - 10.5 K/uL 3.7  6.2  4.4   RBC 3.87 - 5.11 Mil/uL 4.65  4.76  4.61   Hemoglobin 12.0 - 15.0 g/dL 19.1  47.8  29.5   HCT 36.0 - 46.0 % 39.8  41.3  39.6   Platelets 150.0 - 400.0 K/uL 198.0  233  218.0   NEUT# 1.4 - 7.7 K/uL 1.9  2.2  2.0   Lymph# 0.7 - 4.0 K/uL 1.4  3.4  2.0      There is no height or weight on file to calculate BMI.  Orders:  Orders Placed This Encounter  Procedures   XR Ankle Complete Left   No orders of the defined types were placed in this encounter.    Procedures: No procedures performed  Clinical Data: No additional findings.  ROS:  All other systems negative, except as noted in the HPI.  Review of Systems  Objective: Vital Signs: There were no vitals taken for this visit.  Specialty Comments:  No specialty comments available.  PMFS History: Patient Active Problem List   Diagnosis Date Noted   Vitamin D deficiency 04/25/2020   IGT (impaired glucose tolerance) 04/25/2020   Obesity, unspecified 05/18/2014   Hyperlipemia 05/18/2014   Hyperglycemia 05/18/2014   Allergic rhinitis 12/15/2007   GERD 12/15/2007   Past Medical History:  Diagnosis Date   Chronic maxillary sinusitis 12/15/2007   Qualifier: Diagnosis of  By: Lovell Sheehan MD, Balinda Quails    Cough variant asthma 10/19/2008   Qualifier: Diagnosis of  By: Lovell Sheehan MD, Balinda Quails    GERD (gastroesophageal reflux disease)    HEMORRHOIDS, INTERNAL 12/15/2007   Qualifier: Diagnosis of  By: Mayford Knife, [name], Domenic Polite    HIATAL HERNIA 12/15/2007   Qualifier: Diagnosis of  By: Mayford Knife, [name], [name]  M    Hypertension    MAMMOGRAM, ABNORMAL 08/01/2008   Qualifier: Diagnosis of  By: Mayford Knife, [name], Domenic Polite    RAYNAUD'S DISEASE 12/15/2007   Qualifier: Diagnosis of  By: Mayford Knife [name], Domenic Polite     Family History  Problem Relation Age of Onset   Diabetes Mother    Hypertension Father     Past Surgical History:  Procedure Laterality Date   CHOLECYSTECTOMY     KNEE SURGERY  1994   trauma to aerm in machine   1996   Social History   Occupational History   Not on file  Tobacco Use   Smoking status: Never   Smokeless tobacco: Never  Substance and Sexual Activity   Alcohol use: No   Drug use: No   Sexual activity: Not on file

## 2023-02-05 ENCOUNTER — Encounter (HOSPITAL_COMMUNITY): Payer: Self-pay | Admitting: Orthopedic Surgery

## 2023-02-05 ENCOUNTER — Ambulatory Visit (HOSPITAL_COMMUNITY): Payer: Medicare Other

## 2023-02-05 ENCOUNTER — Ambulatory Visit (HOSPITAL_BASED_OUTPATIENT_CLINIC_OR_DEPARTMENT_OTHER): Payer: Medicare Other | Admitting: Anesthesiology

## 2023-02-05 ENCOUNTER — Encounter (HOSPITAL_COMMUNITY)
Admission: RE | Disposition: A | Discharge status: Home / Self Care | Payer: Self-pay | Source: Home / Self Care | Attending: Orthopedic Surgery

## 2023-02-05 ENCOUNTER — Other Ambulatory Visit: Payer: Self-pay

## 2023-02-05 ENCOUNTER — Ambulatory Visit (HOSPITAL_COMMUNITY): Payer: Medicare Other | Admitting: Anesthesiology

## 2023-02-05 ENCOUNTER — Ambulatory Visit (HOSPITAL_COMMUNITY)
Admission: RE | Admit: 2023-02-05 | Discharge: 2023-02-05 | Disposition: A | Discharge status: Home / Self Care | Payer: Medicare Other | Attending: Orthopedic Surgery | Admitting: Orthopedic Surgery

## 2023-02-05 DIAGNOSIS — I10 Essential (primary) hypertension: Secondary | ICD-10-CM | POA: Diagnosis not present

## 2023-02-05 DIAGNOSIS — S82892A Other fracture of left lower leg, initial encounter for closed fracture: Secondary | ICD-10-CM | POA: Diagnosis not present

## 2023-02-05 DIAGNOSIS — X58XXXA Exposure to other specified factors, initial encounter: Secondary | ICD-10-CM | POA: Insufficient documentation

## 2023-02-05 DIAGNOSIS — S82842A Displaced bimalleolar fracture of left lower leg, initial encounter for closed fracture: Secondary | ICD-10-CM

## 2023-02-05 DIAGNOSIS — G8918 Other acute postprocedural pain: Secondary | ICD-10-CM | POA: Diagnosis not present

## 2023-02-05 HISTORY — PX: ORIF ANKLE FRACTURE: SHX5408

## 2023-02-05 LAB — CBC
HCT: 39.4 % (ref 36.0–46.0)
Hemoglobin: 12.9 g/dL (ref 12.0–15.0)
MCH: 28.4 pg (ref 26.0–34.0)
MCHC: 32.7 g/dL (ref 30.0–36.0)
MCV: 86.6 fL (ref 80.0–100.0)
Platelets: 195 10*3/uL (ref 150–400)
RBC: 4.55 MIL/uL (ref 3.87–5.11)
RDW: 12.6 % (ref 11.5–15.5)
WBC: 5.1 10*3/uL (ref 4.0–10.5)
nRBC: 0 % (ref 0.0–0.2)

## 2023-02-05 LAB — BASIC METABOLIC PANEL
Anion gap: 10 (ref 5–15)
BUN: 11 mg/dL (ref 8–23)
CO2: 27 mmol/L (ref 22–32)
Calcium: 9.1 mg/dL (ref 8.9–10.3)
Chloride: 103 mmol/L (ref 98–111)
Creatinine, Ser: 0.84 mg/dL (ref 0.44–1.00)
GFR, Estimated: >60 mL/min (ref >60)
Glucose, Bld: 111 mg/dL — ABNORMAL HIGH (ref 70–99)
Potassium: 3.9 mmol/L (ref 3.5–5.1)
Sodium: 140 mmol/L (ref 135–145)

## 2023-02-05 LAB — SURGICAL PCR SCREEN
MRSA, PCR: NEGATIVE
Staphylococcus aureus: NEGATIVE

## 2023-02-05 SURGERY — OPEN REDUCTION INTERNAL FIXATION (ORIF) ANKLE FRACTURE
Anesthesia: Regional | Site: Ankle | Laterality: Left

## 2023-02-05 MED ORDER — OXYCODONE HCL 5 MG PO TABS: 5.0000 mg | ORAL_TABLET | Freq: Once | ORAL | Status: DC | PRN

## 2023-02-05 MED ORDER — AMISULPRIDE (ANTIEMETIC) 5 MG/2ML IV SOLN
INTRAVENOUS | Status: AC
  Filled 2023-02-05: qty 4

## 2023-02-05 MED ORDER — ACETAMINOPHEN 160 MG/5ML PO SOLN: 325.0000 mg | ORAL | Status: DC | PRN

## 2023-02-05 MED ORDER — MIDAZOLAM HCL 2 MG/2ML IJ SOLN
INTRAMUSCULAR | Status: AC
  Filled 2023-02-05: qty 2

## 2023-02-05 MED ORDER — CHLORHEXIDINE GLUCONATE 0.12 % MT SOLN
15.0000 mL | Freq: Once | OROMUCOSAL | Status: DC
  Filled 2023-02-05: qty 15

## 2023-02-05 MED ORDER — PROPOFOL 10 MG/ML IV BOLUS
INTRAVENOUS | Status: DC | PRN
  Administered 2023-02-05: 50 mg via INTRAVENOUS
  Administered 2023-02-05: 150 mg via INTRAVENOUS

## 2023-02-05 MED ORDER — FENTANYL CITRATE (PF) 100 MCG/2ML IJ SOLN: 25.0000 ug | INTRAMUSCULAR | Status: DC | PRN

## 2023-02-05 MED ORDER — ORAL CARE MOUTH RINSE: 15.0000 mL | Freq: Once | OROMUCOSAL | Status: DC

## 2023-02-05 MED ORDER — BUPIVACAINE LIPOSOME 1.3 % IJ SUSP
INTRAMUSCULAR | Status: DC | PRN
  Administered 2023-02-05: 10 mL via PERINEURAL

## 2023-02-05 MED ORDER — AMISULPRIDE (ANTIEMETIC) 5 MG/2ML IV SOLN
10.0000 mg | Freq: Once | INTRAVENOUS | Status: AC
  Administered 2023-02-05: 10 mg via INTRAVENOUS

## 2023-02-05 MED ORDER — PROPOFOL 10 MG/ML IV BOLUS
INTRAVENOUS | Status: AC
  Filled 2023-02-05: qty 20

## 2023-02-05 MED ORDER — MEPERIDINE HCL 25 MG/ML IJ SOLN: 6.2500 mg | INTRAMUSCULAR | Status: DC | PRN

## 2023-02-05 MED ORDER — ROPIVACAINE HCL 5 MG/ML IJ SOLN
INTRAMUSCULAR | Status: DC | PRN
  Administered 2023-02-05: 20 mL via PERINEURAL

## 2023-02-05 MED ORDER — ONDANSETRON HCL 4 MG/2ML IJ SOLN: 4.0000 mg | Freq: Once | INTRAMUSCULAR | Status: DC | PRN

## 2023-02-05 MED ORDER — EPHEDRINE SULFATE-NACL 50-0.9 MG/10ML-% IV SOSY
PREFILLED_SYRINGE | INTRAVENOUS | Status: DC | PRN
  Administered 2023-02-05 (×2): 5 mg via INTRAVENOUS

## 2023-02-05 MED ORDER — FENTANYL CITRATE (PF) 250 MCG/5ML IJ SOLN
INTRAMUSCULAR | Status: AC
  Filled 2023-02-05: qty 5

## 2023-02-05 MED ORDER — ONDANSETRON HCL 4 MG/2ML IJ SOLN
INTRAMUSCULAR | Status: DC | PRN
  Administered 2023-02-05: 4 mg via INTRAVENOUS

## 2023-02-05 MED ORDER — LIDOCAINE 2% (20 MG/ML) 5 ML SYRINGE
INTRAMUSCULAR | Status: AC
  Filled 2023-02-05: qty 5

## 2023-02-05 MED ORDER — CELECOXIB 200 MG PO CAPS
200.0000 mg | ORAL_CAPSULE | Freq: Once | ORAL | Status: AC
  Administered 2023-02-05: 200 mg via ORAL
  Filled 2023-02-05: qty 1

## 2023-02-05 MED ORDER — HYDROCODONE-ACETAMINOPHEN 5-325 MG PO TABS: 1.0000 | ORAL_TABLET | ORAL | 0 refills | Status: DC | PRN

## 2023-02-05 MED ORDER — FENTANYL CITRATE (PF) 100 MCG/2ML IJ SOLN: 100.0000 ug | Freq: Once | INTRAMUSCULAR | Status: AC

## 2023-02-05 MED ORDER — 0.9 % SODIUM CHLORIDE (POUR BTL) OPTIME
TOPICAL | Status: DC | PRN
  Administered 2023-02-05: 1000 mL

## 2023-02-05 MED ORDER — LIDOCAINE 2% (20 MG/ML) 5 ML SYRINGE
INTRAMUSCULAR | Status: DC | PRN
  Administered 2023-02-05: 30 mg via INTRAVENOUS

## 2023-02-05 MED ORDER — OXYCODONE HCL 5 MG/5ML PO SOLN: 5.0000 mg | Freq: Once | ORAL | Status: DC | PRN

## 2023-02-05 MED ORDER — FENTANYL CITRATE (PF) 100 MCG/2ML IJ SOLN
INTRAMUSCULAR | Status: AC
  Administered 2023-02-05: 50 ug via INTRAVENOUS
  Filled 2023-02-05: qty 2

## 2023-02-05 MED ORDER — LACTATED RINGERS IV SOLN: INTRAVENOUS | Status: DC

## 2023-02-05 MED ORDER — MIDAZOLAM HCL 2 MG/2ML IJ SOLN
INTRAMUSCULAR | Status: AC
  Administered 2023-02-05: 2 mg via INTRAVENOUS
  Filled 2023-02-05: qty 2

## 2023-02-05 MED ORDER — DEXAMETHASONE SODIUM PHOSPHATE 10 MG/ML IJ SOLN
INTRAMUSCULAR | Status: DC | PRN
  Administered 2023-02-05: 5 mg via INTRAVENOUS

## 2023-02-05 MED ORDER — BUPIVACAINE LIPOSOME 1.3 % IJ SUSP
INTRAMUSCULAR | Status: AC
  Filled 2023-02-05: qty 10

## 2023-02-05 MED ORDER — ACETAMINOPHEN 325 MG PO TABS: 325.0000 mg | ORAL_TABLET | ORAL | Status: DC | PRN

## 2023-02-05 MED ORDER — MIDAZOLAM HCL 2 MG/2ML IJ SOLN: 2.0000 mg | Freq: Once | INTRAMUSCULAR | Status: AC

## 2023-02-05 MED ORDER — PHENYLEPHRINE 80 MCG/ML (10ML) SYRINGE FOR IV PUSH (FOR BLOOD PRESSURE SUPPORT)
PREFILLED_SYRINGE | INTRAVENOUS | Status: DC | PRN
  Administered 2023-02-05: 160 ug via INTRAVENOUS
  Administered 2023-02-05 (×5): 80 ug via INTRAVENOUS

## 2023-02-05 MED ORDER — DEXAMETHASONE SODIUM PHOSPHATE 10 MG/ML IJ SOLN
INTRAMUSCULAR | Status: AC
  Filled 2023-02-05: qty 1

## 2023-02-05 MED ORDER — BUPIVACAINE HCL (PF) 0.5 % IJ SOLN
INTRAMUSCULAR | Status: DC | PRN
  Administered 2023-02-05: 10 mL

## 2023-02-05 MED ORDER — ACETAMINOPHEN 500 MG PO TABS
1000.0000 mg | ORAL_TABLET | Freq: Once | ORAL | Status: AC
  Administered 2023-02-05: 500 mg via ORAL
  Filled 2023-02-05: qty 2

## 2023-02-05 MED ORDER — CEFAZOLIN SODIUM-DEXTROSE 2-4 GM/100ML-% IV SOLN
2.0000 g | INTRAVENOUS | Status: AC
  Administered 2023-02-05: 2 g via INTRAVENOUS
  Filled 2023-02-05: qty 100

## 2023-02-05 SURGICAL SUPPLY — 50 items
BAG COUNTER SPONGE SURGICOUNT (BAG) ×1 IMPLANT
BAG SPNG CNTER NS LX DISP (BAG) ×1
BANDAGE ESMARK 6X9 LF (GAUZE/BANDAGES/DRESSINGS) IMPLANT
BIT DRILL 110X2.5XQCK CNCT (BIT) IMPLANT
BIT DRILL 2.5 (BIT) ×1
BIT DRL 110X2.5XQCK CNCT (BIT) ×1
BNDG CMPR 5X4 CHSV STRCH STRL (GAUZE/BANDAGES/DRESSINGS) ×1
BNDG CMPR 9X6 STRL LF SNTH (GAUZE/BANDAGES/DRESSINGS)
BNDG COHESIVE 4X5 TAN STRL LF (GAUZE/BANDAGES/DRESSINGS) IMPLANT
BNDG ESMARK 6X9 LF (GAUZE/BANDAGES/DRESSINGS)
BNDG GAUZE DERMACEA FLUFF 4 (GAUZE/BANDAGES/DRESSINGS) ×1 IMPLANT
BNDG GZE DERMACEA 4 6PLY (GAUZE/BANDAGES/DRESSINGS) ×1
COVER SURGICAL LIGHT HANDLE (MISCELLANEOUS) ×1 IMPLANT
DRAPE OEC MINIVIEW 54X84 (DRAPES) IMPLANT
DRAPE U-SHAPE 47X51 STRL (DRAPES) ×1 IMPLANT
DRSG ADAPTIC 3X8 NADH LF (GAUZE/BANDAGES/DRESSINGS) ×1 IMPLANT
DURAPREP 26ML APPLICATOR (WOUND CARE) ×1 IMPLANT
ELECT REM PT RETURN 9FT ADLT (ELECTROSURGICAL) ×1
ELECTRODE REM PT RTRN 9FT ADLT (ELECTROSURGICAL) ×1 IMPLANT
GAUZE PAD ABD 8X10 STRL (GAUZE/BANDAGES/DRESSINGS) ×1 IMPLANT
GAUZE SPONGE 4X4 12PLY STRL (GAUZE/BANDAGES/DRESSINGS) ×1 IMPLANT
GLOVE BIOGEL PI IND STRL 9 (GLOVE) ×1 IMPLANT
GLOVE SURG ORTHO 9.0 STRL STRW (GLOVE) ×1 IMPLANT
GOWN STRL REUS W/ TWL XL LVL3 (GOWN DISPOSABLE) ×3 IMPLANT
GOWN STRL REUS W/TWL XL LVL3 (GOWN DISPOSABLE) ×3
GUIDEWIRE PIN ORTH 6X1.6XSMTH (WIRE) IMPLANT
K-WIRE 1.6 (WIRE) ×1
KIT BASIN OR (CUSTOM PROCEDURE TRAY) ×1 IMPLANT
KIT TURNOVER KIT B (KITS) ×1 IMPLANT
MANIFOLD NEPTUNE II (INSTRUMENTS) ×1 IMPLANT
NS IRRIG 1000ML POUR BTL (IV SOLUTION) ×1 IMPLANT
PACK ORTHO EXTREMITY (CUSTOM PROCEDURE TRAY) ×1 IMPLANT
PAD ARMBOARD 7.5X6 YLW CONV (MISCELLANEOUS) ×2 IMPLANT
PLATE 7HOLE 1/3 TUBULAR (Plate) IMPLANT
SCREW CANN 1/3 THRD RVRS CT (Screw) IMPLANT
SCREW CANNULATED 4.0X40 (Screw) ×2 IMPLANT
SCREW CORT 2.5X20X3.5XST SM (Screw) IMPLANT
SCREW CORTICAL 3.5 16MM (Screw) IMPLANT
SCREW CORTICAL 3.5 18MM (Screw) IMPLANT
SCREW CORTICAL 3.5X12 (Screw) IMPLANT
SCREW CORTICAL 3.5X20 (Screw) ×1 IMPLANT
STAPLER VISISTAT 35W (STAPLE) IMPLANT
SUCTION FRAZIER HANDLE 10FR (MISCELLANEOUS) ×1
SUCTION TUBE FRAZIER 10FR DISP (MISCELLANEOUS) ×1 IMPLANT
SUT ETHILON 2 0 PSLX (SUTURE) IMPLANT
SUT VIC AB 2-0 CT1 27 (SUTURE) ×1
SUT VIC AB 2-0 CT1 TAPERPNT 27 (SUTURE) ×1 IMPLANT
TOWEL GREEN STERILE (TOWEL DISPOSABLE) ×1 IMPLANT
TOWEL GREEN STERILE FF (TOWEL DISPOSABLE) ×1 IMPLANT
TUBE CONNECTING 12X1/4 (SUCTIONS) ×1 IMPLANT

## 2023-02-05 NOTE — Interval H&P Note (Signed)
History and Physical Interval Note:  02/05/2023 9:16 AM  Holly Arellano  has presented today for surgery, with the diagnosis of Left Ankle Fracture.  The various methods of treatment have been discussed with the patient and family. After consideration of risks, benefits and other options for treatment, the patient has consented to  Procedure(s): OPEN REDUCTION INTERNAL FIXATION (ORIF) LEFT ANKLE FRACTURE (Left) as a surgical intervention.  The patient's history has been reviewed, patient examined, no change in status, stable for surgery.  I have reviewed the patient's chart and labs.  Questions were answered to the patient's satisfaction.     Nadara Mustard

## 2023-02-05 NOTE — Anesthesia Procedure Notes (Signed)
Procedure Name: LMA Insertion Date/Time: 02/05/2023 11:19 AM  Performed by: Shary Decamp, CRNAPre-anesthesia Checklist: Patient identified, Emergency Drugs available, Suction available and Patient being monitored Patient Re-evaluated:Patient Re-evaluated prior to induction Oxygen Delivery Method: Circle system utilized Preoxygenation: Pre-oxygenation with 100% oxygen Induction Type: IV induction Ventilation: Mask ventilation without difficulty LMA: LMA flexible inserted LMA Size: 4.0 Number of attempts: 1 Placement Confirmation: positive ETCO2 and breath sounds checked- equal and bilateral Tube secured with: Tape Dental Injury: Teeth and Oropharynx as per pre-operative assessment

## 2023-02-05 NOTE — Anesthesia Postprocedure Evaluation (Signed)
Anesthesia Post Note  Patient: Holly Arellano  Procedure(s) Performed: OPEN REDUCTION INTERNAL FIXATION (ORIF) LEFT ANKLE FRACTURE W/ SCREWS AND PLATES (Left: Ankle)     Patient location during evaluation: PACU Anesthesia Type: Regional and General Level of consciousness: awake and alert Pain management: pain level controlled Vital Signs Assessment: post-procedure vital signs reviewed and stable Respiratory status: spontaneous breathing, nonlabored ventilation, respiratory function stable and patient connected to nasal cannula oxygen Cardiovascular status: blood pressure returned to baseline and stable Postop Assessment: no apparent nausea or vomiting Anesthetic complications: no   No notable events documented.  Last Vitals:  Vitals:   02/05/23 1230 02/05/23 1245  BP: (!) 147/78 (!) 144/72  Pulse: 76 74  Resp: 15 15  Temp: 37 C   SpO2: 97% 99%    Last Pain:  Vitals:   02/05/23 1230  TempSrc:   PainSc: 0-No pain                 Oleg Oleson

## 2023-02-05 NOTE — Transfer of Care (Signed)
Immediate Anesthesia Transfer of Care Note  Patient: Holly Arellano  Procedure(s) Performed: OPEN REDUCTION INTERNAL FIXATION (ORIF) LEFT ANKLE FRACTURE W/ SCREWS AND PLATES (Left: Ankle)  Patient Location: PACU  Anesthesia Type:General and Regional  Level of Consciousness: awake, alert , oriented, and drowsy  Airway & Oxygen Therapy: Patient Spontanous Breathing and Patient connected to nasal cannula oxygen  Post-op Assessment: Report given to RN and Post -op Vital signs reviewed and stable  Post vital signs: Reviewed and stable  Last Vitals:  Vitals Value Taken Time  BP 130/64 02/05/23 1206  Temp    Pulse 71 02/05/23 1208  Resp 12 02/05/23 1208  SpO2 97 % 02/05/23 1208  Vitals shown include unvalidated device data.  Last Pain:  Vitals:   02/05/23 1016  TempSrc:   PainSc: 3          Complications: No notable events documented.

## 2023-02-05 NOTE — Anesthesia Preprocedure Evaluation (Addendum)
Anesthesia Evaluation  Patient identified by MRN, date of birth, ID band Patient awake    Reviewed: Allergy & Precautions, H&P , NPO status , Patient's Chart, lab work & pertinent test results  Airway Mallampati: II  TM Distance: >3 FB Neck ROM: Full    Dental no notable dental hx. (+) Teeth Intact, Dental Advisory Given, Missing, Poor Dentition, Partial Upper, Partial Lower,    Pulmonary neg pulmonary ROS   Pulmonary exam normal breath sounds clear to auscultation       Cardiovascular Exercise Tolerance: Good hypertension, Pt. on medications negative cardio ROS Normal cardiovascular exam Rhythm:Regular Rate:Normal     Neuro/Psych negative neurological ROS  negative psych ROS   GI/Hepatic negative GI ROS, Neg liver ROS,GERD  Controlled,,  Endo/Other  negative endocrine ROS    Renal/GU negative Renal ROS  negative genitourinary   Musculoskeletal negative musculoskeletal ROS (+)    Abdominal   Peds negative pediatric ROS (+)  Hematology negative hematology ROS (+)   Anesthesia Other Findings   Reproductive/Obstetrics negative OB ROS                             Anesthesia Physical Anesthesia Plan  ASA: 3 and emergent  Anesthesia Plan: General and Regional   Post-op Pain Management: Minimal or no pain anticipated, Regional block*, Tylenol PO (pre-op)* and Celebrex PO (pre-op)*   Induction: Intravenous  PONV Risk Score and Plan: 3 and Ondansetron, Dexamethasone and Treatment may vary due to age or medical condition  Airway Management Planned: Oral ETT and LMA  Additional Equipment: None  Intra-op Plan:   Post-operative Plan: Extubation in OR  Informed Consent: I have reviewed the patients History and Physical, chart, labs and discussed the procedure including the risks, benefits and alternatives for the proposed anesthesia with the patient or authorized representative who has  indicated his/her understanding and acceptance.     Dental advisory given  Plan Discussed with: Anesthesiologist and CRNA  Anesthesia Plan Comments: (Discussed both nerve block for pain relief post-op and GA; including NV, sore throat, dental injury, and pulmonary complications)        Anesthesia Quick Evaluation

## 2023-02-05 NOTE — Anesthesia Procedure Notes (Signed)
Anesthesia Regional Block: Adductor canal block   Pre-Anesthetic Checklist: , timeout performed,  Correct Patient, Correct Site, Correct Laterality,  Correct Procedure, Correct Position, site marked,  Risks and benefits discussed,  Surgical consent,  Pre-op evaluation,  At surgeon's request and post-op pain management  Laterality: Left  Prep: chloraprep       Needles:  Injection technique: Single-shot  Needle Type: Echogenic Stimulator Needle     Needle Length: 5cm  Needle Gauge: 22     Additional Needles:   Procedures:, nerve stimulator,,, ultrasound used (permanent image in chart),,    Narrative:  Start time: 02/05/2023 10:45 AM End time: 02/05/2023 10:50 AM Injection made incrementally with aspirations every 5 mL.  Performed by: Personally  Anesthesiologist: Heather Roberts, MD  Additional Notes: Functioning IV was confirmed and monitors were applied.  A 50mm 22ga Arrow echogenic stimulator needle was used. Sterile prep and drape,hand hygiene and sterile gloves were used. Ultrasound guidance: relevant anatomy identified, needle position confirmed, local anesthetic spread visualized around nerve(s)., vascular puncture avoided.  Image printed for medical record. Negative aspiration and negative test dose prior to incremental administration of local anesthetic. The patient tolerated the procedure well.

## 2023-02-05 NOTE — H&P (Signed)
Odelle Mae Asplin is an 67 y.o. female.   Chief Complaint: Left ankle fracture dislocation HPI: Patient is a 67 year old woman who is seen for initial evaluation for displaced bimalleolar left ankle fracture. Patient states that she missed the step and rolled her ankle while [place] the beach on [place]. She was placed in a splint and is seen today for initial evaluation.   Past Medical History:  Diagnosis Date   Chronic maxillary sinusitis 12/15/2007   Qualifier: Diagnosis of  By: Lovell Sheehan MD, John E    Cough variant asthma 10/19/2008   Qualifier: Diagnosis of  By: Lovell Sheehan MD, Balinda Quails    GERD (gastroesophageal reflux disease)    HEMORRHOIDS, INTERNAL 12/15/2007   Qualifier: Diagnosis of  By: Mayford Knife, [name], Domenic Polite    HIATAL HERNIA 12/15/2007   Qualifier: Diagnosis of  By: Mayford Knife, [name], [name]    Hypertension    MAMMOGRAM, ABNORMAL 08/01/2008   Qualifier: Diagnosis of  By: Mayford Knife [name], Domenic Polite    RAYNAUD'S DISEASE 12/15/2007   Qualifier: Diagnosis of  By: Mayford Knife [name], Domenic Polite     Past Surgical History:  Procedure Laterality Date   CHOLECYSTECTOMY     KNEE SURGERY  1994   trauma to aerm in machine   1996    Family History  Problem Relation Age of Onset   Diabetes Mother    Hypertension Father    Social History:  reports that she has never smoked. She has never used smokeless tobacco. She reports that she does not drink alcohol and does not use drugs.  Allergies:  Allergies  Allergen Reactions   Latex Rash    Skin rash     No medications prior to admission.    No results found for this or any previous visit (from the past 48 hour(s)). XR Ankle Complete Left  Result Date: 02/04/2023 Examination patient has a displaced bimalleolar left ankle fracture.   Review of Systems  All other systems reviewed and are negative.   There were no vitals taken for this visit. Physical Exam  Patient is alert, oriented, no adenopathy, well-dressed, normal affect, normal respiratory  effort. Examination patient is a good dorsalis pedis pulse the skin is intact no blisters no ulcers.  Hemoglobin A1c mildly elevated at 5.9.  Negative history for tobacco use.  Assessment/Plan 1. Pain in left ankle and joints of left foot   2. Closed bimalleolar fracture of left ankle, initial encounter       Plan: Will plan for open reduction internal fixation of the bimalleolar left ankle fracture.  Risk and benefits were discussed including infection neurovascular injury DVT nonhealing the wound nonhealing the bone risk for arthritis need for additional surgery.  Patient states she understands wished to proceed at this time  Nadara Mustard, MD 02/05/2023, 6:30 AM

## 2023-02-05 NOTE — Anesthesia Procedure Notes (Signed)
Anesthesia Regional Block: Popliteal block   Pre-Anesthetic Checklist: , timeout performed,  Correct Patient, Correct Site, Correct Laterality,  Correct Procedure, Correct Position, site marked,  Risks and benefits discussed,  Surgical consent,  Pre-op evaluation,  At surgeon's request and post-op pain management  Laterality: Left  Prep: chloraprep       Needles:  Injection technique: Single-shot  Needle Type: Echogenic Stimulator Needle     Needle Length: 5cm  Needle Gauge: 22     Additional Needles:   Procedures:, nerve stimulator,,, ultrasound used (permanent image in chart),,     Nerve Stimulator or Paresthesia:  Response: foot, 0.45 mA  Additional Responses:   Narrative:  Start time: 02/05/2023 11:00 AM End time: 02/05/2023 11:05 AM Injection made incrementally with aspirations every 5 mL.  Performed by: Personally  Anesthesiologist: Bethena Midget, MD  Additional Notes: Functioning IV was confirmed and monitors were applied.  A 50mm 22ga Arrow echogenic stimulator needle was used. Sterile prep and drape,hand hygiene and sterile gloves were used. Ultrasound guidance: relevant anatomy identified, needle position confirmed, local anesthetic spread visualized around nerve(s)., vascular puncture avoided.  Image printed for medical record. Negative aspiration and negative test dose prior to incremental administration of local anesthetic. The patient tolerated the procedure well.

## 2023-02-05 NOTE — Progress Notes (Signed)
Orthopedic Tech Progress Note Patient Details:  Holly Arellano 09-20-1956 [id number]  Ortho Devices Type of Ortho Device: CAM walker Ortho Device/Splint Location: LLE Ortho Device/Splint Interventions: Ordered, Application, Adjustment   Post Interventions Patient Tolerated: Well Instructions Provided: Adjustment of device  [name] OTR/L 02/05/2023, 12:41 PM

## 2023-02-05 NOTE — Op Note (Signed)
02/05/2023  11:59 AM  PATIENT:  Holly Arellano    PRE-OPERATIVE DIAGNOSIS:  Left Ankle Fracture, bimalleolar, closed.  POST-OPERATIVE DIAGNOSIS:  Same  PROCEDURE:  OPEN REDUCTION INTERNAL FIXATION (ORIF) LEFT ANKLE BIMALLEOLAR FRACTURE W/ SCREWS AND PLATES C-arm fluoroscopy to verify reduction.  SURGEON:  Nadara Mustard, MD  PHYSICIAN ASSISTANT:None ANESTHESIA:   General  PREOPERATIVE INDICATIONS:  [name] is a  67 y.o. female with a diagnosis of Left Ankle Fracture who failed conservative measures and elected for surgical management.    The risks benefits and alternatives were discussed with the patient preoperatively including but not limited to the risks of infection, bleeding, nerve injury, cardiopulmonary complications, the need for revision surgery, among others, and the patient was willing to proceed.  OPERATIVE IMPLANTS:   Implant Name Type Inv. Item Serial No. Manufacturer Lot No. LRB No. Used Action  PLATE 7HOLE 1/3 TUBULAR - [id number] Plate PLATE 7HOLE 1/3 TUBULAR  [employer](ORTH,TRAU,BIO,SG)  Left 1 Implanted  SCREW CORTICAL 3.5 - [id number] Screw SCREW CORTICAL 3.5  [employer](ORTH,TRAU,BIO,SG)  Left 1 Implanted  SCREW CORTICAL 3.5X20 - [id number] Screw SCREW CORTICAL 3.5X20  [employer](ORTH,TRAU,BIO,SG)  Left 1 Implanted  SCREW CORTICAL 3.5X12 - [id number] Screw SCREW CORTICAL 3.5X12  [employer](ORTH,TRAU,BIO,SG)  Left 1 Implanted  SCREW CORTICAL 3.5 - [id number] Screw SCREW CORTICAL 3.5  [employer]ON(ORTH,TRAU,BIO,SG)  Left 2 Implanted  SCREW CANNULATED 4.0X40 - [id number] Screw SCREW CANNULATED 4.0X40  [employer]ON(ORTH,TRAU,BIO,SG)  Left 2 Implanted    @ENCIMAGES @  OPERATIVE FINDINGS: C-arm fluoroscopy verified reduction of the mortise.  OPERATIVE PROCEDURE: Patient was brought the operating room and underwent a general anesthetic.  After adequate levels anesthesia were obtained patient's left lower extremity was prepped  using DuraPrep draped into a sterile field a timeout was called.  A lateral incision was made over the fibula this was carried down to the fracture site.  Subperiosteal dissection was used to cleanse the fracture site.  A curette was used to further debride within the fracture.  The fracture was reduced and stabilized with a 20 mm lag screw.   antiglide plate was applied laterally secured proximally with 2 compression screws and distally with a compression screw.  C-arm fluoroscopy verified restoration of the length of the fibula.  The wound was irrigated with normal saline incision closed using 2-0 nylon.  Attention was then focused on the medial malleolus.  An incision was made longitudinally over the medial malleolus.  This was comminuted with 3 fragments of the distal piece.  The fracture site was freshened and cleansed reduced and 2 guidewires were placed across the fracture.  C-arm [place] verified alignment and 240 mm screws were inserted to stabilize the medial malleolar fragment.  See arthroscopy verified congruence of the mortise.  The wound was irrigated with normal saline incision closed using 2-0 nylon a sterile compressive dressing was applied patient was extubated taken the PACU in stable condition.   DISCHARGE PLANNING:  Antibiotic duration: Preoperative antibiotics  Weightbearing: Touchdown weightbearing on the left  Pain medication: Prescription for Vicodin  Dressing care/ Wound VAC: Dry dressing  Ambulatory devices: Walker  Discharge to: Home.  Follow-up: In the office 1 week post operative.

## 2023-02-07 ENCOUNTER — Encounter (HOSPITAL_COMMUNITY): Payer: Self-pay | Admitting: Orthopedic Surgery

## 2023-02-11 ENCOUNTER — Ambulatory Visit: Payer: Medicare Other | Admitting: Family

## 2023-02-11 ENCOUNTER — Encounter: Payer: Self-pay | Admitting: Family

## 2023-02-11 DIAGNOSIS — S82842A Displaced bimalleolar fracture of left lower leg, initial encounter for closed fracture: Secondary | ICD-10-CM

## 2023-02-11 NOTE — Progress Notes (Signed)
   Post-Op Visit Note   Patient: Holly Arellano           Date of Birth: 06-05-1956           MRN: [id number] Visit Date: 02/11/2023 PCP: Philip Aspen, Limmie Patricia, MD  Chief Complaint:  Chief Complaint  Patient presents with   Left Ankle - Routine Post Op    02/05/2023 ORIF left ankle     HPI:  HPI The patient is a 67 year old woman who is seen status post open reduction internal fixation of a left ankle fracture she has been nonweightbearing in her wheelchair with the fracture boot does endorse removing the fracture boot while at rest. Ortho Exam On examination of the left ankle her incisions are both approximated with sutures there is no gaping or drainage minimal edema.  Is developing an equinus contracture  Visit Diagnoses: No diagnosis found.  Plan: Discussed heel cord stretching using his towel or resistance bands.  Use of the cam walker.  Begin daily Dial soap cleansing dry dressings nonweightbearing  Follow-Up Instructions: No follow-ups on file.   Imaging: No results found.  Orders:  No orders of the defined types were placed in this encounter.  No orders of the defined types were placed in this encounter.    PMFS History: Patient Active Problem List   Diagnosis Date Noted   Closed bimalleolar fracture of left ankle 02/05/2023   Vitamin D deficiency 04/25/2020   IGT (impaired glucose tolerance) 04/25/2020   Obesity, unspecified 05/18/2014   Hyperlipemia 05/18/2014   Hyperglycemia 05/18/2014   Allergic rhinitis 12/15/2007   GERD 12/15/2007   Past Medical History:  Diagnosis Date   Chronic maxillary sinusitis 12/15/2007   Qualifier: Diagnosis of  By: Lovell Sheehan MD, Balinda Quails    Cough variant asthma 10/19/2008   Qualifier: Diagnosis of  By: Lovell Sheehan MD, Balinda Quails    GERD (gastroesophageal reflux disease)    HEMORRHOIDS, INTERNAL 12/15/2007   Qualifier: Diagnosis of  By: Mayford Knife, [name], Domenic Polite    HIATAL HERNIA 12/15/2007   Qualifier: Diagnosis of  By: Mayford Knife,  [name], [name]    Hypertension    MAMMOGRAM, ABNORMAL 08/01/2008   Qualifier: Diagnosis of  By: Mayford Knife [name], Domenic Polite    RAYNAUD'S DISEASE 12/15/2007   Qualifier: Diagnosis of  By: Mayford Knife [name], Domenic Polite     Family History  Problem Relation Age of Onset   Diabetes Mother    Hypertension Father     Past Surgical History:  Procedure Laterality Date   CHOLECYSTECTOMY     KNEE SURGERY  1994   ORIF ANKLE FRACTURE Left 02/05/2023   Procedure: OPEN REDUCTION INTERNAL FIXATION (ORIF) LEFT ANKLE FRACTURE W/ SCREWS AND PLATES;  Surgeon: Nadara Mustard, MD;  Location: [facility] OR;  Service: Orthopedics;  Laterality: Left;   trauma to aerm in machine   1996   Social History   Occupational History   Not on file  Tobacco Use   Smoking status: Never   Smokeless tobacco: Never  Vaping Use   Vaping Use: Never used  Substance and Sexual Activity   Alcohol use: No   Drug use: No   Sexual activity: Not on file

## 2023-02-18 ENCOUNTER — Ambulatory Visit: Payer: Medicare Other | Admitting: Family

## 2023-02-18 ENCOUNTER — Encounter: Payer: Self-pay | Admitting: Family

## 2023-02-18 ENCOUNTER — Other Ambulatory Visit (INDEPENDENT_AMBULATORY_CARE_PROVIDER_SITE_OTHER): Payer: Medicare Other

## 2023-02-18 DIAGNOSIS — S82842A Displaced bimalleolar fracture of left lower leg, initial encounter for closed fracture: Secondary | ICD-10-CM | POA: Diagnosis not present

## 2023-02-18 NOTE — Progress Notes (Signed)
   Post-Op Visit Note   Patient: Holly Arellano           Date of Birth: 04/06/1956           MRN: [id number] Visit Date: 02/18/2023 PCP: Philip Aspen, Limmie Patricia, MD  Chief Complaint:  Chief Complaint  Patient presents with   Left Ankle - Routine Post Op    02/05/2023 ORIF left ankle     HPI:  HPI The patient is a 67 year old woman seen status post open reduction internal fixation of a bimalleolar ankle fracture on the left on May 15 she has been working on heel cord stretching she is elevating at night she continues nonweightbearing in her cam walker Ortho Exam On examination of the left ankle her incisions are both well-healed sutures harvested today without incident she will continue nonweightbearing  Visit Diagnoses:  1. Closed bimalleolar fracture of left ankle, initial encounter     Plan: Daily Dial soap cleansing continue CAM Walker continue nonweightbearing 2 weeks follow-up.  Plan to anticipate advancing her weightbearing at that time.  Follow-Up Instructions: Return in about 13 days (around 03/03/2023).   Imaging: XR Ankle Complete Left  Result Date: 02/18/2023 Radiographs of left ankle show stable alignment of fixation hardware. No complicating feature. Mortise is congruent.   Orders:  Orders Placed This Encounter  Procedures   XR Ankle Complete Left   No orders of the defined types were placed in this encounter.    PMFS History: Patient Active Problem List   Diagnosis Date Noted   Closed bimalleolar fracture of left ankle 02/05/2023   Vitamin D deficiency 04/25/2020   IGT (impaired glucose tolerance) 04/25/2020   Obesity, unspecified 05/18/2014   Hyperlipemia 05/18/2014   Hyperglycemia 05/18/2014   Allergic rhinitis 12/15/2007   GERD 12/15/2007   Past Medical History:  Diagnosis Date   Chronic maxillary sinusitis 12/15/2007   Qualifier: Diagnosis of  By: Lovell Sheehan MD, Balinda Quails    Cough variant asthma 10/19/2008   Qualifier: Diagnosis of  By: Lovell Sheehan  MD, Balinda Quails    GERD (gastroesophageal reflux disease)    HEMORRHOIDS, INTERNAL 12/15/2007   Qualifier: Diagnosis of  By: Mayford Knife, [name], Domenic Polite    HIATAL HERNIA 12/15/2007   Qualifier: Diagnosis of  By: Mayford Knife, [name], [name]    Hypertension    MAMMOGRAM, ABNORMAL 08/01/2008   Qualifier: Diagnosis of  By: Mayford Knife [name], Domenic Polite    RAYNAUD'S DISEASE 12/15/2007   Qualifier: Diagnosis of  By: Mayford Knife [name], Domenic Polite     Family History  Problem Relation Age of Onset   Diabetes Mother    Hypertension Father     Past Surgical History:  Procedure Laterality Date   CHOLECYSTECTOMY     KNEE SURGERY  1994   ORIF ANKLE FRACTURE Left 02/05/2023   Procedure: OPEN REDUCTION INTERNAL FIXATION (ORIF) LEFT ANKLE FRACTURE W/ SCREWS AND PLATES;  Surgeon: Nadara Mustard, MD;  Location: [facility] OR;  Service: Orthopedics;  Laterality: Left;   trauma to aerm in machine   1996   Social History   Occupational History   Not on file  Tobacco Use   Smoking status: Never   Smokeless tobacco: Never  Vaping Use   Vaping Use: Never used  Substance and Sexual Activity   Alcohol use: No   Drug use: No   Sexual activity: Not on file

## 2023-02-21 ENCOUNTER — Other Ambulatory Visit: Payer: Self-pay | Admitting: Internal Medicine

## 2023-02-21 DIAGNOSIS — I10 Essential (primary) hypertension: Secondary | ICD-10-CM

## 2023-03-04 ENCOUNTER — Other Ambulatory Visit (INDEPENDENT_AMBULATORY_CARE_PROVIDER_SITE_OTHER): Payer: Medicare Other

## 2023-03-04 ENCOUNTER — Ambulatory Visit (INDEPENDENT_AMBULATORY_CARE_PROVIDER_SITE_OTHER): Payer: Medicare Other | Admitting: Family

## 2023-03-04 DIAGNOSIS — S82842A Displaced bimalleolar fracture of left lower leg, initial encounter for closed fracture: Secondary | ICD-10-CM | POA: Diagnosis not present

## 2023-03-04 NOTE — Progress Notes (Unsigned)
   Post-Op Visit Note   Patient: Holly Arellano           Date of Birth: 05/16/1956           MRN: [id number] Visit Date: 03/04/2023 PCP: Philip Aspen, Limmie Patricia, MD  Chief Complaint:  Chief Complaint  Patient presents with   Left Ankle - Routine Post Op    02/05/2023 ORIF left ankle    HPI:  HPI The patient is a 67 year old woman who presents today in follow-up she is status post ORIF left ankle fracture May 15 she has been in a cam walker nonweightbearing she has begun working on heel cord stretching with [employer] at home Ortho Exam On examination of the left ankle she does have heel cord contracture dorsiflexion shy of neutral about 20 degrees incisions are well-healed minimal edema no erythema or warmth  Visit Diagnoses:  1. Closed bimalleolar fracture of left ankle, initial encounter     Plan: Begin touchdown weightbearing or and advance as tolerated in her cam walker given a walker today we will follow-up in the office in 2 weeks.  Have also referred to physical therapy.  Follow-Up Instructions: No follow-ups on file.   Imaging: No results found.  Orders:  Orders Placed This Encounter  Procedures   XR Ankle Complete Left   No orders of the defined types were placed in this encounter.    PMFS History: Patient Active Problem List   Diagnosis Date Noted   Closed bimalleolar fracture of left ankle 02/05/2023   Vitamin D deficiency 04/25/2020   IGT (impaired glucose tolerance) 04/25/2020   Obesity, unspecified 05/18/2014   Hyperlipemia 05/18/2014   Hyperglycemia 05/18/2014   Allergic rhinitis 12/15/2007   GERD 12/15/2007   Past Medical History:  Diagnosis Date   Chronic maxillary sinusitis 12/15/2007   Qualifier: Diagnosis of  By: Lovell Sheehan MD, Balinda Quails    Cough variant asthma 10/19/2008   Qualifier: Diagnosis of  By: Lovell Sheehan MD, Balinda Quails    GERD (gastroesophageal reflux disease)    HEMORRHOIDS, INTERNAL 12/15/2007   Qualifier: Diagnosis of  By: Mayford Knife,  [name], Domenic Polite    HIATAL HERNIA 12/15/2007   Qualifier: Diagnosis of  By: Mayford Knife, [name], [name]    Hypertension    MAMMOGRAM, ABNORMAL 08/01/2008   Qualifier: Diagnosis of  By: Mayford Knife [name], Domenic Polite    RAYNAUD'S DISEASE 12/15/2007   Qualifier: Diagnosis of  By: Mayford Knife [name], Domenic Polite     Family History  Problem Relation Age of Onset   Diabetes Mother    Hypertension Father     Past Surgical History:  Procedure Laterality Date   CHOLECYSTECTOMY     KNEE SURGERY  1994   ORIF ANKLE FRACTURE Left 02/05/2023   Procedure: OPEN REDUCTION INTERNAL FIXATION (ORIF) LEFT ANKLE FRACTURE W/ SCREWS AND PLATES;  Surgeon: Nadara Mustard, MD;  Location: [facility] OR;  Service: Orthopedics;  Laterality: Left;   trauma to aerm in machine   1996   Social History   Occupational History   Not on file  Tobacco Use   Smoking status: Never   Smokeless tobacco: Never  Vaping Use   Vaping Use: Never used  Substance and Sexual Activity   Alcohol use: No   Drug use: No   Sexual activity: Not on file

## 2023-03-05 ENCOUNTER — Encounter: Payer: Self-pay | Admitting: Family

## 2023-03-10 DIAGNOSIS — I10 Essential (primary) hypertension: Secondary | ICD-10-CM | POA: Diagnosis not present

## 2023-03-12 ENCOUNTER — Ambulatory Visit: Payer: Medicare Other | Admitting: Physical Therapy

## 2023-03-12 ENCOUNTER — Encounter: Payer: Self-pay | Admitting: Physical Therapy

## 2023-03-12 ENCOUNTER — Other Ambulatory Visit: Payer: Self-pay

## 2023-03-12 DIAGNOSIS — M6281 Muscle weakness (generalized): Secondary | ICD-10-CM

## 2023-03-12 DIAGNOSIS — R2689 Other abnormalities of gait and mobility: Secondary | ICD-10-CM

## 2023-03-12 DIAGNOSIS — M25672 Stiffness of left ankle, not elsewhere classified: Secondary | ICD-10-CM | POA: Diagnosis not present

## 2023-03-12 DIAGNOSIS — M25572 Pain in left ankle and joints of left foot: Secondary | ICD-10-CM

## 2023-03-12 DIAGNOSIS — R600 Localized edema: Secondary | ICD-10-CM | POA: Diagnosis not present

## 2023-03-12 DIAGNOSIS — R2681 Unsteadiness on feet: Secondary | ICD-10-CM

## 2023-03-12 NOTE — Therapy (Signed)
OUTPATIENT PHYSICAL THERAPY LOWER EXTREMITY EVALUATION   Patient Name: Holly Arellano MRN: [id number] DOB:10/02/1955, 67 y.o., female Today's Date: 03/12/2023  END OF SESSION:  PT End of Session - 03/12/23 1035     Visit Number 1    Number of Visits 25    Date for PT Re-Evaluation 06/06/23    Authorization Type BCBS Medicare    Authorization Time Period $10 copay    Progress Note Due on Visit 10    PT Start Time 0931    PT Stop Time 1035    PT Time Calculation (min) 64 min    Equipment Utilized During Treatment Gait belt    Activity Tolerance Patient tolerated treatment well;Patient limited by pain    Behavior During Therapy WFL for tasks assessed/performed             Past Medical History:  Diagnosis Date   Chronic maxillary sinusitis 12/15/2007   Qualifier: Diagnosis of  By: Lovell Sheehan MD, John E    Cough variant asthma 10/19/2008   Qualifier: Diagnosis of  By: Lovell Sheehan MD, John E    GERD (gastroesophageal reflux disease)    HEMORRHOIDS, INTERNAL 12/15/2007   Qualifier: Diagnosis of  By: Mayford Knife, [name], Domenic Polite    HIATAL HERNIA 12/15/2007   Qualifier: Diagnosis of  By: Mayford Knife, [name], [name]    Hypertension    MAMMOGRAM, ABNORMAL 08/01/2008   Qualifier: Diagnosis of  By: Mayford Knife [name], Domenic Polite    RAYNAUD'S DISEASE 12/15/2007   Qualifier: Diagnosis of  By: Mayford Knife [name], Domenic Polite    Past Surgical History:  Procedure Laterality Date   CHOLECYSTECTOMY     KNEE SURGERY  1994   ORIF ANKLE FRACTURE Left 02/05/2023   Procedure: OPEN REDUCTION INTERNAL FIXATION (ORIF) LEFT ANKLE FRACTURE W/ SCREWS AND PLATES;  Surgeon: Nadara Mustard, MD;  Location: [facility] OR;  Service: Orthopedics;  Laterality: Left;   trauma to aerm in machine   1996   Patient Active Problem List   Diagnosis Date Noted   Closed bimalleolar fracture of left ankle 02/05/2023   Vitamin D deficiency 04/25/2020   IGT (impaired glucose tolerance) 04/25/2020   Obesity, unspecified 05/18/2014   Hyperlipemia  05/18/2014   Hyperglycemia 05/18/2014   Allergic rhinitis 12/15/2007   GERD 12/15/2007    PCP: Chaya Jan, MD  REFERRING PROVIDER: Barnie Del, NP  REFERRING DIAG: [phone] (ICD-10-CM) - Closed bimalleolar fracture of left ankle   THERAPY DIAG:  Stiffness of left ankle, not elsewhere classified  Pain in left ankle and joints of left foot  Localized edema  Muscle weakness (generalized)  Other abnormalities of gait and mobility  Unsteadiness on feet  Rationale for Evaluation and Treatment: Rehabilitation  ONSET DATE: 02/05/2023 surgery ORIF  SUBJECTIVE:   SUBJECTIVE STATEMENT: On 02/01/2023 patient missed a step & [place] sustaining a bimalleolar left ankle fracture. Patient underwent ORIF on 02/05/2023. She was NWBing LLE until 03/04/2023 office visit and progressed to TDWBing in CAM walker.   PERTINENT HISTORY: Left ankle bimalleolar fx with ORIF 02/05/2023, asthma, HTN, Raynaud's disease, knee surgery left 1990's  PAIN:  NPRS scale: today 5/10 and in last week 2/10 - 8/10 Pain location: left ankle posterior, foot forefoot & arch Pain description: throbbing Aggravating factors: standing & walking Relieving factors: tylenol, elevation & compression sock  PRECAUTIONS: Fall  WEIGHT BEARING RESTRICTIONS: Yes LLE TDWB in CAM walker  FALLS:  Has patient fallen in last 6 months? Yes. 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   Next MD visit:  03/17/2023  OBJECTIVE:  DIAGNOSTIC FINDINGS: 03/04/2023 Radiographs of the left ankle show stable  alignment of fixation hardware for bimalleolar ankle fracture.  No complicating feature.   PATIENT SURVEYS:  FOTO intake:  38%  predicted:  64%  COGNITION: Overall cognitive status: WFL    SENSATION: WFL  EDEMA:  Circumferential:  LLE: above malleoli  26.5 cm,  around ankle heel to dorsum 36.4 cm, midfoot 27.6 cm RLE: above malleoli  22 cm,  around ankle heel to dorsum 32.1 cm, midfoot 24.6 cm  POSTURE: rounded shoulders, forward head, flexed trunk , and weight shift right  PALPATION: Tenderness medial & lateral ankle; tenderness forefoot dorsum & plantar surface  LOWER EXTREMITY ROM:   ROM Left eval  Hip flexion   Hip extension   Hip abduction   Hip adduction   Hip internal rotation   Hip external rotation   Knee flexion   Knee extension   Ankle dorsiflexion Supine  Knee ext A: -22* P: -19* Knee flex A: -15* P: -13*  Ankle plantarflexion Supine A: 25* P: 29*  Ankle inversion Supine P: 2*  Ankle eversion Supine  P: 1*   (Blank rows = not tested)  LOWER EXTREMITY MMT:  MMT Left eval  Hip flexion   Hip extension   Hip abduction   Hip adduction   Hip internal rotation   Hip external rotation   Knee flexion   Knee extension   Ankle dorsiflexion 3-/5  Ankle plantarflexion 3-/5  Ankle inversion 3-/5  Ankle eversion 3-/5   (Blank rows = not tested)  FUNCTIONAL TESTS:  18 inch chair transfer: requires use of armrests to arise.  Needed PT cues to limit LLE weight bearing per MD office visit  GAIT: Distance walked: 100' Assistive device utilized: Environmental consultant - 2 wheeled Level of assistance: SBA Comments: pt arrived to PT using RW & CAM boot with almost full weight bearing including taking 2 steps away from RW (no UE support)   TODAY'S TREATMENT                                                                          DATE: 03/12/2023:  Therex:   HEP instruction/performance c cues for techniques, handout provided.  Elevation >/= 3x/day >/=15 min.  Trial  set performed of each for comprehension and symptom assessment.  See below for exercise list Gait Training: PT explained office note 03/04/2023 said TDWBing in CAM boot. PT instructed in need for compliance until MD clears for more weight bearing to enable fracture to heal more. PT demo & verbal cues on TDWBing with RW. Pt amb 5' & 9' with RW with TDWB LLE with CAM boot with supervision.   PATIENT EDUCATION:  Education details: gait, HEP, POC Person educated: Patient Education method: Explanation,  Demonstration, Verbal cues, and Handouts Education comprehension: verbalized understanding, returned demonstration, and verbal cues required  HOME EXERCISE PROGRAM: Access Code: [email or web address] URL: [email or web address] Date: 03/12/2023 Prepared by: Vladimir Faster  Exercises - Ankle Alphabet in Elevation  - 3 x daily - 7 x weekly - 1 sets - 3-5 reps - 15 minutes elevation hold - Seated Calf Stretch with Strap  - 3 x daily - 7 x weekly - 1 sets - 3-5 reps - 30 seconds hold - Seated Soleus Stretch with Strap  - 3 x daily - 7 x weekly - 1 sets - 3-5 reps - 30 seconds hold  ASSESSMENT: CLINICAL IMPRESSION: Patient is a 67 y.o. who comes to clinic with complaints of left ankle pain with mobility, strength and movement coordination deficits that impair their ability to perform usual daily and recreational functional activities without increase difficulty/symptoms at this time.  Patient to benefit from skilled PT services to address impairments and limitations to improve to previous level of function without restriction secondary to condition.   OBJECTIVE IMPAIRMENTS: Abnormal gait, decreased activity tolerance, decreased balance, decreased endurance, decreased knowledge of condition, decreased knowledge of use of DME, decreased mobility, difficulty walking, decreased ROM, decreased strength, increased edema, impaired flexibility, postural dysfunction, obesity, and pain.   ACTIVITY LIMITATIONS:  carrying, lifting, bending, standing, squatting, sleeping, stairs, transfers, and locomotion level  PARTICIPATION LIMITATIONS: meal prep, cleaning, laundry, driving, community activity, and occupation  PERSONAL FACTORS: Age, Fitness, Time since onset of injury/illness/exacerbation, and 3+ comorbidities: see PMH  are also affecting patient's functional outcome.   REHAB POTENTIAL: Good  CLINICAL DECISION MAKING: Stable/uncomplicated  EVALUATION COMPLEXITY: Low   GOALS: Goals reviewed with patient? Yes  SHORT TERM GOALS: (target date for Short term goals 04/11/2023)   1.  Patient will demonstrate independent use of home exercise program to maintain progress from in clinic treatments. Baseline: See objective data Goal status: New  2. PROM left ankle DF 0* Baseline: See objective data Goal status: New  3. Interim FOTO >45% Baseline: See objective data Goal status: New   LONG TERM GOALS: (target dates for all long term goals  06/06/2023 )   1. Patient will demonstrate/report pain at worst less than or equal to 2/10 to facilitate minimal limitation in daily activity secondary to pain symptoms. Baseline: See Objective data Goal status: New   2. Patient will demonstrate independent use of home exercise program to facilitate ability to maintain/progress functional gains from skilled physical therapy services. Baseline: See Objective data Goal status: New   3. Patient will demonstrate FOTO outcome > or = 64 % to indicate reduced disability due to condition. Baseline: See Objective data Goal status: New   4.  Patient will demonstrate left ankle MMT >/= 4/5 to faciltiate usual transfers, stairs, squatting at [facility] for daily life.  Baseline: See Objective data Goal status: New   5.  Left ankle AROM DF >15* and PF >45* Baseline: See Objective data Goal status: New   6.  patient ambulates without assistive device >1000' and negotiates ramps, curbs & stairs single rail independently.   Baseline: See Objective data Goal status: New    PLAN:  PT FREQUENCY:  2x/week  PT DURATION: 12 weeks  PLANNED INTERVENTIONS: Therapeutic exercises, Therapeutic activity, Neuro Muscular re-education, Balance training, Gait training, Patient/Family education, Joint mobilization, Stair training, DME instructions, Dry Needling, Electrical stimulation, Traction, Cryotherapy, vasopneumatic deviceMoist heat, Taping, Ultrasound, Ionotophoresis 4mg /ml Dexamethasone, and aquatic therapy, Manual therapy.  All included unless contraindicated  PLAN FOR NEXT  SESSION:  check Dr. Lajoyce Corners note Monday 6/24 and update gait as indicated, review & update HEP, manual therapy for range,    Vladimir Faster, PT, DPT 03/12/2023, 12:43 PM

## 2023-03-17 ENCOUNTER — Ambulatory Visit: Payer: Medicare Other | Admitting: Orthopedic Surgery

## 2023-03-17 ENCOUNTER — Ambulatory Visit (INDEPENDENT_AMBULATORY_CARE_PROVIDER_SITE_OTHER): Payer: Medicare Other | Admitting: Physical Therapy

## 2023-03-17 ENCOUNTER — Ambulatory Visit (INDEPENDENT_AMBULATORY_CARE_PROVIDER_SITE_OTHER): Payer: Medicare Other

## 2023-03-17 ENCOUNTER — Encounter: Payer: Self-pay | Admitting: Physical Therapy

## 2023-03-17 DIAGNOSIS — R600 Localized edema: Secondary | ICD-10-CM

## 2023-03-17 DIAGNOSIS — M25572 Pain in left ankle and joints of left foot: Secondary | ICD-10-CM

## 2023-03-17 DIAGNOSIS — M25672 Stiffness of left ankle, not elsewhere classified: Secondary | ICD-10-CM

## 2023-03-17 DIAGNOSIS — M6281 Muscle weakness (generalized): Secondary | ICD-10-CM | POA: Diagnosis not present

## 2023-03-17 DIAGNOSIS — R2689 Other abnormalities of gait and mobility: Secondary | ICD-10-CM

## 2023-03-17 DIAGNOSIS — S82842A Displaced bimalleolar fracture of left lower leg, initial encounter for closed fracture: Secondary | ICD-10-CM

## 2023-03-17 DIAGNOSIS — R2681 Unsteadiness on feet: Secondary | ICD-10-CM

## 2023-03-17 NOTE — Therapy (Signed)
OUTPATIENT PHYSICAL THERAPY LOWER EXTREMITY  Patient Name: Holly Arellano MRN: [id number] DOB:1956/03/02, 67 y.o., female Today's Date: 03/17/2023  END OF SESSION:  PT End of Session - 03/17/23 1114     Visit Number 2    Number of Visits 25    Date for PT Re-Evaluation 06/06/23    Authorization Type BCBS Medicare    Authorization Time Period $10 copay    Progress Note Due on Visit 10    PT Start Time 1118    PT Stop Time 1150    PT Time Calculation (min) 32 min    Activity Tolerance Patient tolerated treatment well;Patient limited by pain    Behavior During Therapy [facility] for tasks assessed/performed              Past Medical History:  Diagnosis Date   Chronic maxillary sinusitis 12/15/2007   Qualifier: Diagnosis of  By: Lovell Sheehan MD, John E    Cough variant asthma 10/19/2008   Qualifier: Diagnosis of  By: Lovell Sheehan MD, John E    GERD (gastroesophageal reflux disease)    HEMORRHOIDS, INTERNAL 12/15/2007   Qualifier: Diagnosis of  By: Mayford Knife, [name], Domenic Polite    HIATAL HERNIA 12/15/2007   Qualifier: Diagnosis of  By: Mayford Knife, [name], [name]    Hypertension    MAMMOGRAM, ABNORMAL 08/01/2008   Qualifier: Diagnosis of  By: Mayford Knife [name], Domenic Polite    RAYNAUD'S DISEASE 12/15/2007   Qualifier: Diagnosis of  By: Mayford Knife [name], Domenic Polite    Past Surgical History:  Procedure Laterality Date   CHOLECYSTECTOMY     KNEE SURGERY  1994   ORIF ANKLE FRACTURE Left 02/05/2023   Procedure: OPEN REDUCTION INTERNAL FIXATION (ORIF) LEFT ANKLE FRACTURE W/ SCREWS AND PLATES;  Surgeon: Nadara Mustard, MD;  Location: [facility] OR;  Service: Orthopedics;  Laterality: Left;   trauma to aerm in machine   1996   Patient Active Problem List   Diagnosis Date Noted   Closed bimalleolar fracture of left ankle 02/05/2023   Vitamin D deficiency 04/25/2020   IGT (impaired glucose tolerance) 04/25/2020   Obesity, unspecified 05/18/2014   Hyperlipemia 05/18/2014   Hyperglycemia 05/18/2014   Allergic rhinitis  12/15/2007   GERD 12/15/2007    PCP: Chaya Jan, MD  REFERRING PROVIDER: Barnie Del, NP  REFERRING DIAG: [phone] (ICD-10-CM) - Closed bimalleolar fracture of left ankle   THERAPY DIAG:  Stiffness of left ankle, not elsewhere classified  Pain in left ankle and joints of left foot  Localized edema  Muscle weakness (generalized)  Other abnormalities of gait and mobility  Unsteadiness on feet  Rationale for Evaluation and Treatment: Rehabilitation  ONSET DATE: 02/05/2023 surgery ORIF  SUBJECTIVE:   SUBJECTIVE STATEMENT: Pt, husband and daughter arriving today late after her appointment with Dr. Lajoyce Corners was running a little late. Pt's daughter stating that Dr. Lajoyce Corners stated he images looked good and she is now able to start wt bearing with a ankle brace and she as tolerated. Pt was instructed to allow pain to be her limitation.   Dr. Audrie Lia note not available in chart to review at present time.   PERTINENT HISTORY: Left ankle bimalleolar fx with ORIF 02/05/2023, asthma, HTN, Raynaud's disease, knee surgery left 1990's  PAIN:  NPRS scale: today 6/10 Pain location: left ankle posterior, foot forefoot & arch Pain description: throbbing Aggravating factors: standing & walking Relieving factors: tylenol, elevation & compression sock  PRECAUTIONS: Fall  WEIGHT BEARING RESTRICTIONS: Yes LLE TDWB in CAM walker  FALLS:  Has patient fallen in last 6 months? Yes. 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   Next MD visit:  03/17/2023  OBJECTIVE:  DIAGNOSTIC FINDINGS:  03/04/2023 Radiographs of the left ankle show stable alignment of fixation hardware for bimalleolar ankle fracture.  No complicating feature.   PATIENT SURVEYS:  FOTO intake:  38%  predicted:  64%  COGNITION: Overall cognitive status: WFL    SENSATION: WFL  EDEMA:  Circumferential:  LLE: above malleoli  26.5 cm,  around ankle heel to dorsum 36.4 cm, midfoot 27.6 cm RLE: above malleoli  22 cm,  around ankle heel to dorsum 32.1 cm, midfoot 24.6 cm  POSTURE: rounded shoulders, forward head, flexed trunk , and weight shift right  PALPATION: Tenderness medial & lateral ankle; tenderness forefoot dorsum & plantar surface  LOWER EXTREMITY ROM:   ROM Left eval  Hip flexion   Hip extension   Hip abduction   Hip adduction   Hip internal rotation   Hip external rotation   Knee flexion   Knee extension   Ankle dorsiflexion Supine  Knee ext A: -22* P: -19* Knee flex A: -15* P: -13*  Ankle plantarflexion Supine A: 25* P: 29*  Ankle inversion Supine P: 2*  Ankle eversion Supine  P: 1*   (Blank rows = not tested)  LOWER EXTREMITY MMT:  MMT Left eval  Hip flexion   Hip extension   Hip abduction   Hip adduction   Hip internal rotation   Hip external rotation   Knee flexion   Knee extension   Ankle dorsiflexion 3-/5  Ankle plantarflexion 3-/5  Ankle inversion 3-/5  Ankle eversion 3-/5   (Blank rows = not tested)  FUNCTIONAL TESTS:  18 inch chair transfer: requires use of armrests to arise.  Needed PT cues to limit LLE weight bearing per MD office visit  GAIT: Distance walked: 100' Assistive device utilized: Environmental consultant - 2 wheeled Level of assistance: SBA Comments: pt arrived to PT using RW & CAM boot with almost full weight bearing including taking 2 steps away from RW (no UE support)   TODAY'S TREATMENT                                                                          DATE: 03/17/23:  TherEx:  Transfer w/c to mat table CGA and min assist for  stabilization w/c  Supine AP x 20 Supine ankle circles both direction x 20  Supine achilles stretch x 4 holding 30 sec Supine heel slides x 20  Supine: 4 way ankle exercises 2 x 10 c red TB Manual:  PROM to Left ankle to pt's tolerance Modalities:  Vaspneumatic: 34 deg x 10 minutes medium compression c LE elevated      03/12/2023:  Therex:   HEP instruction/performance c cues for techniques, handout  provided.  Elevation >/= 3x/day >/=15 min.  Trial set performed of each for comprehension and symptom assessment.  See below for exercise list Gait Training: PT explained office note 03/04/2023 said TDWBing in CAM boot. PT instructed in need for compliance until MD clears for more weight bearing to enable fracture to heal more. PT demo & verbal cues on TDWBing with RW. Pt amb 5' & 84' with RW with TDWB LLE with CAM boot with supervision.   PATIENT EDUCATION:  Education details: gait, HEP, POC Person educated: Patient Education method: Explanation, Demonstration, Verbal cues, and Handouts Education comprehension: verbalized understanding, returned demonstration, and verbal cues required  HOME EXERCISE PROGRAM: Access Code: [email or web address] URL: [email or web address] Date: 03/12/2023 Prepared by: Vladimir Faster  Exercises - Ankle Alphabet in Elevation  - 3 x daily - 7 x weekly - 1 sets - 3-5 reps - 15 minutes elevation hold - Seated Calf Stretch with Strap  - 3 x daily - 7 x weekly - 1 sets - 3-5 reps - 30 seconds hold - Seated Soleus Stretch with Strap  - 3 x daily - 7 x weekly - 1 sets - 3-5 reps - 30 seconds hold  ASSESSMENT: CLINICAL IMPRESSION: Pt arriving today reporting 6/10 pain in her left ankle/foot. Pt stating Dr. Lajoyce Corners released her to start weight bearing using ankle support brace and her RW. Pt did not have shoes today treatment focusing on active and passive ROM. Pt was instructed to use her ice machine at home and to freeze a water bottle and roll her left foot over  it to help with de-sensitization. Continue skilled PT interventions.    OBJECTIVE IMPAIRMENTS: Abnormal gait, decreased activity tolerance, decreased balance, decreased endurance, decreased knowledge of condition, decreased knowledge of use of DME, decreased mobility, difficulty walking, decreased ROM, decreased strength, increased edema, impaired flexibility, postural dysfunction, obesity, and pain.   ACTIVITY LIMITATIONS: carrying, lifting, bending, standing, squatting, sleeping, stairs, transfers, and locomotion level  PARTICIPATION LIMITATIONS: meal prep, cleaning, laundry, driving, community activity, and occupation  PERSONAL FACTORS: Age, Fitness, Time since onset of injury/illness/exacerbation, and 3+ comorbidities: see PMH  are also affecting patient's functional outcome.   REHAB POTENTIAL: Good  CLINICAL DECISION MAKING: Stable/uncomplicated  EVALUATION COMPLEXITY: Low   GOALS: Goals reviewed with patient? Yes  SHORT TERM GOALS: (target date for Short term goals 04/11/2023)   1.  Patient will demonstrate independent use of home exercise program to maintain progress from in clinic treatments. Baseline: See objective data Goal status: New  2. PROM left ankle DF 0* Baseline: See objective data Goal status: New  3. Interim FOTO >45% Baseline: See objective data Goal status: New   LONG TERM GOALS: (target dates for all long term goals  06/06/2023 )   1. Patient will demonstrate/report pain at worst less than or equal to 2/10 to facilitate minimal limitation in daily activity secondary to pain symptoms. Baseline: See Objective data Goal status: New   2. Patient will demonstrate independent use of home exercise program to facilitate ability to maintain/progress functional gains from skilled physical therapy services. Baseline: See Objective data Goal status: New   3. Patient will demonstrate FOTO outcome > or = 64 % to indicate reduced disability due to  condition. Baseline: See Objective data Goal status: New   4.  Patient will demonstrate left ankle MMT >/= 4/5 to faciltiate usual transfers, stairs, squatting at [facility] for daily life.  Baseline: See Objective data Goal status: New   5.  Left ankle AROM DF >  15* and PF >45* Baseline: See Objective data Goal status: New   6.  patient ambulates without assistive device >1000' and negotiates ramps, curbs & stairs single rail independently.  Baseline: See Objective data Goal status: New    PLAN:  PT FREQUENCY:  2x/week  PT DURATION: 12 weeks  PLANNED INTERVENTIONS: Therapeutic exercises, Therapeutic activity, Neuro Muscular re-education, Balance training, Gait training, Patient/Family education, Joint mobilization, Stair training, DME instructions, Dry Needling, Electrical stimulation, Traction, Cryotherapy, vasopneumatic deviceMoist heat, Taping, Ultrasound, Ionotophoresis 4mg /ml Dexamethasone, and aquatic therapy, Manual therapy.  All included unless contraindicated  PLAN FOR NEXT SESSION: check to see if Dr. Audrie Lia note has been updated in pt's chart from her visit on 03/17/23.   Update gait as indicated, review & update HEP, manual therapy for range,    Sharmon Leyden, PT, MPT 03/17/2023, 12:15 PM

## 2023-03-18 ENCOUNTER — Encounter: Payer: Self-pay | Admitting: Orthopedic Surgery

## 2023-03-18 NOTE — Progress Notes (Signed)
Office Visit Note   Patient: Nala Kachel           Date of Birth: Dec 15, 1955           MRN: [id number] Visit Date: 03/17/2023              Requested by: Philip Aspen, Limmie Patricia, MD [address] [place],  [place] [address] PCP: Philip Aspen, Limmie Patricia, MD  Chief Complaint  Patient presents with   Left Ankle - Routine Post Op    02/05/23 ORIF left ankle      HPI: Patient is a 67 year old woman who is 5 weeks status post open reduction internal fixation bimalleolar ankle fracture.  A with her physical therapy ssessment & Plan: Visit Diagnoses:  1. Pain in left ankle and joints of left foot   2. Closed bimalleolar fracture of left ankle, initial encounter     Plan: Advance to an ASO weightbearing as tolerated continue physical therapy.   I Instructions: Return in about 4 weeks (around 04/14/2023).   Ortho Exam  Patient is alert, oriented, no adenopathy, well-dressed, normal affect, normal respiratory effort. Examination there is decreased range of motion of the ankle the incisions are well-healed.  Imaging: No results found. No images are attached to the encounter.  Labs: Lab Results  Component Value Date   HGBA1C 5.9 (A) 12/25/2022   HGBA1C 6.0 (A) 09/25/2022   HGBA1C 5.9 (A) 06/25/2022   LABORGA Normal Upper Respiratory Flora 05/18/2013   LABORGA No Beta Hemolytic Streptococci Isolated 05/18/2013     Lab Results  Component Value Date   ALBUMIN 4.2 06/25/2022   ALBUMIN 4.2 02/07/2022   ALBUMIN 4.2 07/02/2021    No results found for: "MG" Lab Results  Component Value Date   VD25OH 46.13 09/25/2022   VD25OH 27.89 (L) 06/25/2022   VD25OH 43.37 10/23/2021    No results found for: "PREALBUMIN"    Latest Ref Rng & Units 02/05/2023    9:36 AM 06/25/2022    8:27 AM 02/07/2022   11:59 PM  CBC EXTENDED  WBC 4.0 - 10.5 K/uL 5.1  3.7  6.2   RBC 3.87 - 5.11 MIL/uL 4.55  4.65  4.76   Hemoglobin 12.0 - 15.0 g/dL 02.7  25.3  66.4   HCT 36.0 -  46.0 % 39.4  39.8  41.3   Platelets 150 - 400 K/uL 195  198.0  233   NEUT# 1.4 - 7.7 K/uL  1.9  2.2   Lymph# 0.7 - 4.0 K/uL  1.4  3.4      There is no height or weight on file to calculate BMI.  Orders:  Orders Placed This Encounter  Procedures   XR Ankle Complete Left   No orders of the defined types were placed in this encounter.    Procedures: No procedures performed  Clinical Data: No additional findings.  ROS:  All other systems negative, except as noted in the HPI. Review of Systems  Objective: Vital Signs: There were no vitals taken for this visit.  Specialty Comments:  No specialty comments available.  PMFS History: Patient Active Problem List   Diagnosis Date Noted   Closed bimalleolar fracture of left ankle 02/05/2023   Vitamin D deficiency 04/25/2020   IGT (impaired glucose tolerance) 04/25/2020   Obesity, unspecified 05/18/2014   Hyperlipemia 05/18/2014   Hyperglycemia 05/18/2014   Allergic rhinitis 12/15/2007   GERD 12/15/2007   Past Medical History:  Diagnosis Date   Chronic maxillary sinusitis 12/15/2007  Qualifier: Diagnosis of  By: Lovell Sheehan MD, John E    Cough variant asthma 10/19/2008   Qualifier: Diagnosis of  By: Lovell Sheehan MD, Balinda Quails    GERD (gastroesophageal reflux disease)    HEMORRHOIDS, INTERNAL 12/15/2007   Qualifier: Diagnosis of  By: Mayford Knife, [name], Domenic Polite    HIATAL HERNIA 12/15/2007   Qualifier: Diagnosis of  By: Mayford Knife, [name], [name]    Hypertension    MAMMOGRAM, ABNORMAL 08/01/2008   Qualifier: Diagnosis of  By: Mayford Knife [name], Domenic Polite    RAYNAUD'S DISEASE 12/15/2007   Qualifier: Diagnosis of  By: Mayford Knife [name], Domenic Polite     Family History  Problem Relation Age of Onset   Diabetes Mother    Hypertension Father     Past Surgical History:  Procedure Laterality Date   CHOLECYSTECTOMY     KNEE SURGERY  1994   ORIF ANKLE FRACTURE Left 02/05/2023   Procedure: OPEN REDUCTION INTERNAL FIXATION (ORIF) LEFT ANKLE FRACTURE W/  SCREWS AND PLATES;  Surgeon: Nadara Mustard, MD;  Location: [facility] OR;  Service: Orthopedics;  Laterality: Left;   trauma to aerm in machine   1996   Social History   Occupational History   Not on file  Tobacco Use   Smoking status: Never   Smokeless tobacco: Never  Vaping Use   Vaping Use: Never used  Substance and Sexual Activity   Alcohol use: No   Drug use: No   Sexual activity: Not on file

## 2023-03-19 ENCOUNTER — Ambulatory Visit: Payer: Medicare Other | Admitting: Physical Therapy

## 2023-03-19 ENCOUNTER — Encounter: Payer: Self-pay | Admitting: Physical Therapy

## 2023-03-19 DIAGNOSIS — M25572 Pain in left ankle and joints of left foot: Secondary | ICD-10-CM

## 2023-03-19 DIAGNOSIS — R2681 Unsteadiness on feet: Secondary | ICD-10-CM

## 2023-03-19 DIAGNOSIS — R2689 Other abnormalities of gait and mobility: Secondary | ICD-10-CM

## 2023-03-19 DIAGNOSIS — M6281 Muscle weakness (generalized): Secondary | ICD-10-CM | POA: Diagnosis not present

## 2023-03-19 DIAGNOSIS — M25672 Stiffness of left ankle, not elsewhere classified: Secondary | ICD-10-CM | POA: Diagnosis not present

## 2023-03-19 DIAGNOSIS — R600 Localized edema: Secondary | ICD-10-CM

## 2023-03-19 NOTE — Therapy (Signed)
OUTPATIENT PHYSICAL THERAPY LOWER EXTREMITY  Patient Name: Holly Arellano MRN: [id number] DOB:October 23, 1955, 67 y.o., female Today's Date: 03/19/2023  END OF SESSION:  PT End of Session - 03/19/23 0928     Visit Number 3    Number of Visits 25    Date for PT Re-Evaluation 06/06/23    Authorization Type BCBS Medicare    Authorization Time Period $10 copay    Progress Note Due on Visit 10    PT Start Time 0928    PT Stop Time 1012    PT Time Calculation (min) 44 min    Activity Tolerance Patient tolerated treatment well;Patient limited by pain    Behavior During Therapy [facility] for tasks assessed/performed              Past Medical History:  Diagnosis Date   Chronic maxillary sinusitis 12/15/2007   Qualifier: Diagnosis of  By: Lovell Sheehan MD, John E    Cough variant asthma 10/19/2008   Qualifier: Diagnosis of  By: Lovell Sheehan MD, John E    GERD (gastroesophageal reflux disease)    HEMORRHOIDS, INTERNAL 12/15/2007   Qualifier: Diagnosis of  By: Mayford Knife, [name], Domenic Polite    HIATAL HERNIA 12/15/2007   Qualifier: Diagnosis of  By: Mayford Knife, [name], [name]    Hypertension    MAMMOGRAM, ABNORMAL 08/01/2008   Qualifier: Diagnosis of  By: Mayford Knife [name], Domenic Polite    RAYNAUD'S DISEASE 12/15/2007   Qualifier: Diagnosis of  By: Mayford Knife [name], Domenic Polite    Past Surgical History:  Procedure Laterality Date   CHOLECYSTECTOMY     KNEE SURGERY  1994   ORIF ANKLE FRACTURE Left 02/05/2023   Procedure: OPEN REDUCTION INTERNAL FIXATION (ORIF) LEFT ANKLE FRACTURE W/ SCREWS AND PLATES;  Surgeon: Nadara Mustard, MD;  Location: [facility] OR;  Service: Orthopedics;  Laterality: Left;   trauma to aerm in machine   1996   Patient Active Problem List   Diagnosis Date Noted   Closed bimalleolar fracture of left ankle 02/05/2023   Vitamin D deficiency 04/25/2020   IGT (impaired glucose tolerance) 04/25/2020   Obesity, unspecified 05/18/2014   Hyperlipemia 05/18/2014   Hyperglycemia 05/18/2014   Allergic rhinitis  12/15/2007   GERD 12/15/2007    PCP: Chaya Jan, MD  REFERRING PROVIDER: Barnie Del, NP  REFERRING DIAG: [phone] (ICD-10-CM) - Closed bimalleolar fracture of left ankle   THERAPY DIAG:  Stiffness of left ankle, not elsewhere classified  Pain in left ankle and joints of left foot  Muscle weakness (generalized)  Localized edema  Other abnormalities of gait and mobility  Unsteadiness on feet  Rationale for Evaluation and Treatment: Rehabilitation  ONSET DATE: 02/05/2023 surgery ORIF  SUBJECTIVE:   SUBJECTIVE STATEMENT: Dr. Lajoyce Corners note on 03/17/2023 reports WBAT in ankle brace. She has been walking with RW & ASO.   PERTINENT HISTORY: Left ankle bimalleolar fx with ORIF 02/05/2023, asthma, HTN, Raynaud's disease, knee surgery left 1990's  PAIN:  NPRS scale: today 5/10 Pain location: left ankle posterior, foot forefoot & arch Pain description: throbbing Aggravating factors: standing & walking Relieving factors: tylenol, elevation & compression sock  PRECAUTIONS: Fall  WEIGHT BEARING RESTRICTIONS: Yes 03/17/2023 office note LLE TDWB in ASO  FALLS:  Has patient fallen in last 6 months? Yes. 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   Next MD visit:  04/10/2023  OBJECTIVE:  DIAGNOSTIC FINDINGS: 03/04/2023 Radiographs of the left ankle show stable alignment of fixation hardware for bimalleolar ankle fracture.  No complicating feature.   PATIENT SURVEYS:  FOTO intake:  38%  predicted:  64%  COGNITION: Overall cognitive status: WFL    SENSATION: WFL  EDEMA:   Circumferential:  LLE: above malleoli  26.5 cm,  around ankle heel to dorsum 36.4 cm, midfoot 27.6 cm RLE: above malleoli  22 cm,  around ankle heel to dorsum 32.1 cm, midfoot 24.6 cm  POSTURE: rounded shoulders, forward head, flexed trunk , and weight shift right  PALPATION: Tenderness medial & lateral ankle; tenderness forefoot dorsum & plantar surface  LOWER EXTREMITY ROM:   ROM Left eval  Hip flexion   Hip extension   Hip abduction   Hip adduction   Hip internal rotation   Hip external rotation   Knee flexion   Knee extension   Ankle dorsiflexion Supine  Knee ext A: -22* P: -19* Knee flex A: -15* P: -13*  Ankle plantarflexion Supine A: 25* P: 29*  Ankle inversion Supine P: 2*  Ankle eversion Supine  P: 1*   (Blank rows = not tested)  LOWER EXTREMITY MMT:  MMT Left eval  Hip flexion   Hip extension   Hip abduction   Hip adduction   Hip internal rotation   Hip external rotation   Knee flexion   Knee extension   Ankle dorsiflexion 3-/5  Ankle plantarflexion 3-/5  Ankle inversion 3-/5  Ankle eversion 3-/5   (Blank rows = not tested)  FUNCTIONAL TESTS:  18 inch chair transfer: requires use of armrests to arise.  Needed PT cues to limit LLE weight bearing per MD office visit  GAIT: Distance walked: 100' Assistive device utilized: Environmental consultant - 2 wheeled Level of assistance: SBA Comments: pt arrived to PT using RW & CAM boot with almost full weight bearing including taking 2 steps away from RW (no UE support)   TODAY'S TREATMENT                                                                          DATE: 03/19/2023 Therapeutic Exercise: Nustep seat 9 level 5 with BLEs/BUEs 8 min Standing gastroc & soleus stretch 30 sec hold 2 reps each. PT added to HEP with HO, verbal & demo cues. Pt verbalized & return demo understanding.   Gait Training: PT demo & verbal cues on how to properly don ASO. Pt verbalized understanding. Pre-gait at counter stepping LLE  over 2" X 10" long step with terminal stance knee flexion / heel rise and stance knee ext weight bearing LLE. Gait with RW carryover of above and fluent step through pattern.  Upon arising bilateral stance weight shift right/left to weight LLE 5 reps. PT demo & verbal cues on technique for curbs, ramps & stairs. Pt neg curb & ramp 2 reps ea with supervision. Pt neg stairs step to pattern 2 steps with 2 rails and 2 steps  side stepping with BUE left (ascend side) rail with supervision.    TREATMENT                                                                          DATE: 03/17/23:  TherEx:  Transfer w/c to mat table CGA and min assist for stabilization w/c  Supine AP x 20 Supine ankle circles both direction x 20  Supine achilles stretch x 4 holding 30 sec Supine heel slides x 20  Supine: 4 way ankle exercises 2 x 10 c red TB Manual:  PROM to Left ankle to pt's tolerance Modalities:  Vaspneumatic: 34 deg x 10 minutes medium compression c LE elevated      03/12/2023:  Therex:   HEP instruction/performance c cues for techniques, handout provided.  Elevation >/= 3x/day >/=15 min.  Trial set performed of each for comprehension and symptom assessment.  See below for exercise list Gait Training: PT explained office note 03/04/2023 said TDWBing in CAM boot. PT instructed in need for compliance until MD clears for more weight bearing to enable fracture to heal more. PT demo & verbal cues on TDWBing with RW. Pt amb 5' & 40' with RW with TDWB LLE with CAM boot with supervision.   PATIENT EDUCATION:  Education details: gait, HEP, POC Person educated: Patient Education method: Explanation, Demonstration, Verbal cues, and Handouts Education comprehension: verbalized understanding, returned demonstration, and verbal cues required  HOME EXERCISE PROGRAM: Access Code: [email or web address] URL: [email or web address] Date: 03/19/2023 Prepared by: Vladimir Faster  Exercises - Ankle Alphabet in  Elevation  - 3 x daily - 7 x weekly - 1 sets - 3-5 reps - 15 minutes elevation hold - Seated Calf Stretch with Strap  - 3 x daily - 7 x weekly - 1 sets - 3-5 reps - 30 seconds hold - Seated Soleus Stretch with Strap  - 3 x daily - 7 x weekly - 1 sets - 3-5 reps - 30 seconds hold - standing calf stretch with forefoot on small step or brick  - 2 x daily - 7 x weekly - 1 sets - 3 reps - 30 seconds hold - Standing Soleus Stretch on Foam 1/2 Roll  - 2 x daily - 7 x weekly - 1 sets - 3 reps - 30 seconds hold  ASSESSMENT: CLINICAL IMPRESSION: Today's PT session focused on gait WBAT with ASO & RW including donning ASO, ramps, curbs & stairs. Pt improved with PT instruction.  PT added gastroc & soleus stretch standing which she appears to understand.   OBJECTIVE IMPAIRMENTS: Abnormal gait, decreased activity tolerance, decreased balance, decreased endurance, decreased knowledge of condition, decreased knowledge of use of DME, decreased mobility, difficulty walking, decreased ROM, decreased strength, increased edema, impaired flexibility, postural dysfunction, obesity, and pain.   ACTIVITY LIMITATIONS: carrying, lifting, bending, standing, squatting, sleeping, stairs, transfers, and locomotion level  PARTICIPATION LIMITATIONS: meal prep, cleaning, laundry, driving, community activity, and occupation  PERSONAL FACTORS: Age, Fitness, Time since onset of injury/illness/exacerbation, and 3+ comorbidities: see PMH  are also affecting patient's functional outcome.   REHAB POTENTIAL: Good  CLINICAL DECISION MAKING: Stable/uncomplicated  EVALUATION COMPLEXITY: Low   GOALS: Goals reviewed with patient? Yes  SHORT TERM GOALS: (target date for Short  term goals 04/11/2023)   1.  Patient will demonstrate independent use of home exercise program to maintain progress from in clinic treatments. Baseline: See objective data Goal status: Ongoing 03/19/2023  2. PROM left ankle DF 0* Baseline: See objective  data Goal status: Ongoing 03/19/2023  3. Interim FOTO >45% Baseline: See objective data Goal status: Ongoing 03/19/2023   LONG TERM GOALS: (target dates for all long term goals  06/06/2023 )   1. Patient will demonstrate/report pain at worst less than or equal to 2/10 to facilitate minimal limitation in daily activity secondary to pain symptoms. Baseline: See Objective data Goal status: Ongoing 03/19/2023   2. Patient will demonstrate independent use of home exercise program to facilitate ability to maintain/progress functional gains from skilled physical therapy services. Baseline: See Objective data Goal status: Ongoing 03/19/2023   3. Patient will demonstrate FOTO outcome > or = 64 % to indicate reduced disability due to condition. Baseline: See Objective data Goal status: Ongoing 03/19/2023   4.  Patient will demonstrate left ankle MMT >/= 4/5 to faciltiate usual transfers, stairs, squatting at [facility] for daily life.  Baseline: See Objective data Goal status: Ongoing 03/19/2023   5.  Left ankle AROM DF >15* and PF >45* Baseline: See Objective data Goal status: Ongoing 03/19/2023   6.  patient ambulates without assistive device >1000' and negotiates ramps, curbs & stairs single rail independently.  Baseline: See Objective data Goal status: Ongoing 03/19/2023    PLAN:  PT FREQUENCY:  2x/week  PT DURATION: 12 weeks  PLANNED INTERVENTIONS: Therapeutic exercises, Therapeutic activity, Neuro Muscular re-education, Balance training, Gait training, Patient/Family education, Joint mobilization, Stair training, DME instructions, Dry Needling, Electrical stimulation, Traction, Cryotherapy, vasopneumatic deviceMoist heat, Taping, Ultrasound, Ionotophoresis 4mg /ml Dexamethasone, and aquatic therapy, Manual therapy.  All included unless contraindicated  PLAN FOR NEXT SESSION: check gait, progress standing exercises and balance activities.  manual therapy for range, consider vaso to end session.      Vladimir Faster, PT, DPT 03/19/2023, 10:33 AM

## 2023-03-25 ENCOUNTER — Ambulatory Visit: Payer: Medicare Other | Admitting: Physical Therapy

## 2023-03-25 ENCOUNTER — Encounter: Payer: Self-pay | Admitting: Physical Therapy

## 2023-03-25 DIAGNOSIS — R2681 Unsteadiness on feet: Secondary | ICD-10-CM

## 2023-03-25 DIAGNOSIS — M6281 Muscle weakness (generalized): Secondary | ICD-10-CM | POA: Diagnosis not present

## 2023-03-25 DIAGNOSIS — M25572 Pain in left ankle and joints of left foot: Secondary | ICD-10-CM | POA: Diagnosis not present

## 2023-03-25 DIAGNOSIS — M25672 Stiffness of left ankle, not elsewhere classified: Secondary | ICD-10-CM | POA: Diagnosis not present

## 2023-03-25 DIAGNOSIS — R600 Localized edema: Secondary | ICD-10-CM

## 2023-03-25 DIAGNOSIS — R2689 Other abnormalities of gait and mobility: Secondary | ICD-10-CM

## 2023-03-25 NOTE — Therapy (Signed)
OUTPATIENT PHYSICAL THERAPY LOWER EXTREMITY  Patient Name: Holly Arellano MRN: [id number] DOB:[birth year], 67 y.o., female Today's Date: 03/25/2023  END OF SESSION:  PT End of Session - 03/25/23 1305     Visit Number 4    Number of Visits 25    Date for PT Re-Evaluation 06/06/23    Authorization Type [employer] Medicare    Authorization Time Period $10 copay    Progress Note Due on Visit 10    PT Start Time 1301    PT Stop Time 1343    PT Time Calculation (min) 42 min    Activity Tolerance Patient tolerated treatment well;Patient limited by pain    Behavior During Therapy [facility] for tasks assessed/performed              Past Medical History:  Diagnosis Date   Chronic maxillary sinusitis 12/15/2007   Qualifier: Diagnosis of  By: Lovell Sheehan MD, John E    Cough variant asthma 10/19/2008   Qualifier: Diagnosis of  By: Lovell Sheehan MD, John E    GERD (gastroesophageal reflux disease)    HEMORRHOIDS, INTERNAL 12/15/2007   Qualifier: Diagnosis of  By: Mayford Knife, [name], Domenic Polite    HIATAL HERNIA 12/15/2007   Qualifier: Diagnosis of  By: Mayford Knife, [name], [name]    Hypertension    MAMMOGRAM, ABNORMAL 08/01/2008   Qualifier: Diagnosis of  By: Mayford Knife [name], Domenic Polite    RAYNAUD'S DISEASE 12/15/2007   Qualifier: Diagnosis of  By: Mayford Knife [name], Domenic Polite    Past Surgical History:  Procedure Laterality Date   CHOLECYSTECTOMY     KNEE SURGERY  1994   ORIF ANKLE FRACTURE Left 02/05/2023   Procedure: OPEN REDUCTION INTERNAL FIXATION (ORIF) LEFT ANKLE FRACTURE W/ SCREWS AND PLATES;  Surgeon: Nadara Mustard, MD;  Location: [facility] OR;  Service: Orthopedics;  Laterality: Left;   trauma to aerm in machine   1996   Patient Active Problem List   Diagnosis Date Noted   Closed bimalleolar fracture of left ankle 02/05/2023   Vitamin D deficiency 04/25/2020   IGT (impaired glucose tolerance) 04/25/2020   Obesity, unspecified 05/18/2014   Hyperlipemia 05/18/2014   Hyperglycemia 05/18/2014   Allergic rhinitis  12/15/2007   GERD 12/15/2007    PCP: Chaya Jan, MD  REFERRING PROVIDER: Barnie Del, NP  REFERRING DIAG: [phone] (ICD-10-CM) - Closed bimalleolar fracture of left ankle   THERAPY DIAG:  Stiffness of left ankle, not elsewhere classified  Pain in left ankle and joints of left foot  Muscle weakness (generalized)  Localized edema  Other abnormalities of gait and mobility  Unsteadiness on feet  Rationale for Evaluation and Treatment: Rehabilitation  ONSET DATE: 02/05/2023 surgery ORIF  SUBJECTIVE:   SUBJECTIVE STATEMENT: She has been walking more and her shoulders now hurt (8-9/10).   PERTINENT HISTORY: Left ankle bimalleolar fx with ORIF 02/05/2023, asthma, HTN, Raynaud's disease, knee surgery left 1990's  PAIN:  NPRS scale: today 4/10 Pain location: left ankle posterior, foot forefoot & arch Pain description: throbbing Aggravating factors: standing & walking Relieving factors: tylenol, elevation & compression sock  PRECAUTIONS: Fall  WEIGHT BEARING RESTRICTIONS: Yes 03/17/2023 office note LLE WBAT in ASO  FALLS:  Has patient fallen in last 6 months? Yes. 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   Next MD visit:  04/10/2023  OBJECTIVE:  DIAGNOSTIC FINDINGS: 03/04/2023 Radiographs of the left ankle show stable alignment of fixation hardware for bimalleolar ankle fracture.  No complicating feature.   PATIENT SURVEYS:  FOTO intake:  38%  predicted:  64%  COGNITION: Overall cognitive status: WFL    SENSATION: WFL  EDEMA:  Circumferential:  LLE:  above malleoli  26.5 cm,  around ankle heel to dorsum 36.4 cm, midfoot 27.6 cm RLE: above malleoli  22 cm,  around ankle heel to dorsum 32.1 cm, midfoot 24.6 cm  POSTURE: rounded shoulders, forward head, flexed trunk , and weight shift right  PALPATION: Tenderness medial & lateral ankle; tenderness forefoot dorsum & plantar surface  LOWER EXTREMITY ROM:   ROM Left eval  Hip flexion   Hip extension   Hip abduction   Hip adduction   Hip internal rotation   Hip external rotation   Knee flexion   Knee extension   Ankle dorsiflexion Supine  Knee ext A: -22* P: -19* Knee flex A: -15* P: -13*  Ankle plantarflexion Supine A: 25* P: 29*  Ankle inversion Supine P: 2*  Ankle eversion Supine  P: 1*   (Blank rows = not tested)  LOWER EXTREMITY MMT:  MMT Left eval  Hip flexion   Hip extension   Hip abduction   Hip adduction   Hip internal rotation   Hip external rotation   Knee flexion   Knee extension   Ankle dorsiflexion 3-/5  Ankle plantarflexion 3-/5  Ankle inversion 3-/5  Ankle eversion 3-/5   (Blank rows = not tested)  FUNCTIONAL TESTS:  18 inch chair transfer: requires use of armrests to arise.  Needed PT cues to limit LLE weight bearing per MD office visit  GAIT: Distance walked: 100' Assistive device utilized: Environmental consultant - 2 wheeled Level of assistance: SBA Comments: pt arrived to PT using RW & CAM boot with almost full weight bearing including taking 2 steps away from RW (no UE support)   TODAY'S TREATMENT                                                                          DATE: 03/25/2023: Therapeutic Exercise: Nustep seat 9 level 5 with BLEs/BUEs 8 min Standing gastroc & soleus stretch 30 sec hold 2 reps each. Balance activities to facilitate LLE reactionary movements. Standing on foam hip width with equal weight bearing BLEs - head movements 10 reps right/left, up/down & diagonals. And static eyes closed 10 sec 3 reps.  Gastroc stretch on incline  board 30 sec 2 reps Sit to stand from 24" bar stool without UE support 10 reps.  Therapeutic Activities: PT demo & verbal cues on loading / unloading RW in car. Pt has LLE ankle issue and is not taking narcotic meds so should be able to return to driving.  PT demo & verbal cues on using folding 24" bar stool in kitchen for modified stand for ADLs. Pt verbalized understanding.  PT demo & verbal cues on standing with equal weight bearing with light LUE  support (facilitate equal weight) with eyes closed to pray. When in church, do not use first row so something is in front of her to touch. Pt verbalized understanding.    Gait Training: PT reviewed with demo & verbal cues on how to properly don ASO. Pt verbalized understanding. PT demo & verbal cues on not staring at floor for upright posture to limit UE weight bearing which should help shoulder pain. Pt amb 50' & 100' with RW & LLE ASO WBAT with less shoulder pain.    TREATMENT                                                                          DATE: 03/19/2023 Therapeutic Exercise: Nustep seat 9 level 5 with BLEs/BUEs 8 min Standing gastroc & soleus stretch 30 sec hold 2 reps each. PT added to HEP with HO, verbal & demo cues. Pt verbalized & return demo understanding.   Gait Training: PT demo & verbal cues on how to properly don ASO. Pt verbalized understanding. Pre-gait at counter stepping LLE over 2" X 10" long step with terminal stance knee flexion / heel rise and stance knee ext weight bearing LLE. Gait with RW carryover of above and fluent step through pattern.  Upon arising bilateral stance weight shift right/left to weight LLE 5 reps. PT demo & verbal cues on technique for curbs, ramps & stairs. Pt neg curb & ramp 2 reps ea with supervision. Pt neg stairs step to pattern 2 steps with 2 rails and 2 steps side stepping with BUE left (ascend side) rail with supervision.    TREATMENT                                                                           DATE: 03/17/23:  TherEx:  Transfer w/c to mat table CGA and min assist for stabilization w/c  Supine AP x 20 Supine ankle circles both direction x 20  Supine achilles stretch x 4 holding 30 sec Supine heel slides x 20  Supine: 4 way ankle exercises 2 x 10 c red TB Manual:  PROM to Left ankle to pt's tolerance Modalities:  Vaspneumatic: 34 deg x 10 minutes medium compression c LE elevated    PATIENT EDUCATION:  Education details: gait, HEP, POC Person educated: Patient Education method: Programmer, multimedia, Demonstration, Verbal cues, and Handouts Education comprehension: verbalized understanding, returned demonstration, and verbal cues required  HOME EXERCISE PROGRAM: Access Code: [email or web address] URL: [email or web address] Date: 03/19/2023 Prepared by: Vladimir Faster  Exercises - Ankle Alphabet in Elevation  - 3 x daily - 7 x weekly - 1 sets - 3-5 reps - 15 minutes elevation hold - Seated Calf Stretch with Strap  - 3 x daily - 7 x weekly - 1 sets - 3-5 reps - 30 seconds hold - Seated Soleus Stretch with Strap  - 3 x daily - 7 x weekly - 1 sets - 3-5 reps - 30 seconds hold - standing  calf stretch with forefoot on small step or brick  - 2 x daily - 7 x weekly - 1 sets - 3 reps - 30 seconds hold - Standing Soleus Stretch on Foam 1/2 Roll  - 2 x daily - 7 x weekly - 1 sets - 3 reps - 30 seconds hold  ASSESSMENT: CLINICAL IMPRESSION: PT began standing activities for controlled weight bearing LLE with improved balance & function following instruction.  Pt had less shoulder pain with PT instruction in posture with gait.   OBJECTIVE IMPAIRMENTS: Abnormal gait, decreased activity tolerance, decreased balance, decreased endurance, decreased knowledge of condition, decreased knowledge of use of DME, decreased mobility, difficulty walking, decreased ROM, decreased strength, increased edema, impaired flexibility, postural dysfunction, obesity, and pain.   ACTIVITY  LIMITATIONS: carrying, lifting, bending, standing, squatting, sleeping, stairs, transfers, and locomotion level  PARTICIPATION LIMITATIONS: meal prep, cleaning, laundry, driving, community activity, and occupation  PERSONAL FACTORS: Age, Fitness, Time since onset of injury/illness/exacerbation, and 3+ comorbidities: see PMH  are also affecting patient's functional outcome.   REHAB POTENTIAL: Good  CLINICAL DECISION MAKING: Stable/uncomplicated  EVALUATION COMPLEXITY: Low   GOALS: Goals reviewed with patient? Yes  SHORT TERM GOALS: (target date for Short term goals 04/11/2023)   1.  Patient will demonstrate independent use of home exercise program to maintain progress from in clinic treatments. Baseline: See objective data Goal status: Ongoing 03/19/2023  2. PROM left ankle DF 0* Baseline: See objective data Goal status: Ongoing 03/19/2023  3. Interim FOTO >45% Baseline: See objective data Goal status: Ongoing 03/19/2023   LONG TERM GOALS: (target dates for all long term goals  06/06/2023 )   1. Patient will demonstrate/report pain at worst less than or equal to 2/10 to facilitate minimal limitation in daily activity secondary to pain symptoms. Baseline: See Objective data Goal status: Ongoing 03/19/2023   2. Patient will demonstrate independent use of home exercise program to facilitate ability to maintain/progress functional gains from skilled physical therapy services. Baseline: See Objective data Goal status: Ongoing 03/19/2023   3. Patient will demonstrate FOTO outcome > or = 64 % to indicate reduced disability due to condition. Baseline: See Objective data Goal status: Ongoing 03/19/2023   4.  Patient will demonstrate left ankle MMT >/= 4/5 to faciltiate usual transfers, stairs, squatting at [facility] for daily life.  Baseline: See Objective data Goal status: Ongoing 03/19/2023   5.  Left ankle AROM DF >15* and PF >45* Baseline: See Objective data Goal status: Ongoing  03/19/2023   6.  patient ambulates without assistive device >1000' and negotiates ramps, curbs & stairs single rail independently.  Baseline: See Objective data Goal status: Ongoing 03/19/2023    PLAN:  PT FREQUENCY:  2x/week  PT DURATION: 12 weeks  PLANNED INTERVENTIONS: Therapeutic exercises, Therapeutic activity, Neuro Muscular re-education, Balance training, Gait training, Patient/Family education, Joint mobilization, Stair training, DME instructions, Dry Needling, Electrical stimulation, Traction, Cryotherapy, vasopneumatic deviceMoist heat, Taping, Ultrasound, Ionotophoresis 4mg /ml Dexamethasone, and aquatic therapy, Manual therapy.  All included unless contraindicated  PLAN FOR NEXT SESSION: check ankle PROM, continue to progress standing exercises and balance activities.  manual therapy for range, consider vaso to end session. WBAT LLE in ASO    Vladimir Faster, PT, DPT 03/25/2023, 2:03 PM

## 2023-03-28 ENCOUNTER — Ambulatory Visit: Payer: Medicare Other | Admitting: Physical Therapy

## 2023-03-28 ENCOUNTER — Encounter: Payer: Self-pay | Admitting: Physical Therapy

## 2023-03-28 DIAGNOSIS — M25572 Pain in left ankle and joints of left foot: Secondary | ICD-10-CM

## 2023-03-28 DIAGNOSIS — M6281 Muscle weakness (generalized): Secondary | ICD-10-CM

## 2023-03-28 DIAGNOSIS — R2681 Unsteadiness on feet: Secondary | ICD-10-CM

## 2023-03-28 DIAGNOSIS — M25672 Stiffness of left ankle, not elsewhere classified: Secondary | ICD-10-CM

## 2023-03-28 DIAGNOSIS — R600 Localized edema: Secondary | ICD-10-CM

## 2023-03-28 DIAGNOSIS — R2689 Other abnormalities of gait and mobility: Secondary | ICD-10-CM

## 2023-03-28 NOTE — Therapy (Signed)
OUTPATIENT PHYSICAL THERAPY TREATMENT  Patient Name: Holly Arellano MRN: [id number] DOB:Feb 02, 1956, 67 y.o., female Today's Date: 03/28/2023  END OF SESSION:  PT End of Session - 03/28/23 0927     Visit Number 5    Number of Visits 25    Date for PT Re-Evaluation 06/06/23    Authorization Type BCBS Medicare    Authorization Time Period $10 copay    Progress Note Due on Visit 10    PT Start Time 0927    PT Stop Time 1010    PT Time Calculation (min) 43 min    Activity Tolerance Patient tolerated treatment well;Patient limited by pain    Behavior During Therapy [facility] for tasks assessed/performed               Past Medical History:  Diagnosis Date   Chronic maxillary sinusitis 12/15/2007   Qualifier: Diagnosis of  By: Lovell Sheehan MD, John E    Cough variant asthma 10/19/2008   Qualifier: Diagnosis of  By: Lovell Sheehan MD, John E    GERD (gastroesophageal reflux disease)    HEMORRHOIDS, INTERNAL 12/15/2007   Qualifier: Diagnosis of  By: Mayford Knife, [name], Domenic Polite    HIATAL HERNIA 12/15/2007   Qualifier: Diagnosis of  By: Mayford Knife, [name], [name]    Hypertension    MAMMOGRAM, ABNORMAL 08/01/2008   Qualifier: Diagnosis of  By: Mayford Knife [name], Domenic Polite    RAYNAUD'S DISEASE 12/15/2007   Qualifier: Diagnosis of  By: Mayford Knife [name], Domenic Polite    Past Surgical History:  Procedure Laterality Date   CHOLECYSTECTOMY     KNEE SURGERY  1994   ORIF ANKLE FRACTURE Left 02/05/2023   Procedure: OPEN REDUCTION INTERNAL FIXATION (ORIF) LEFT ANKLE FRACTURE W/ SCREWS AND PLATES;  Surgeon: Nadara Mustard, MD;  Location: [facility] OR;  Service: Orthopedics;  Laterality: Left;   trauma to aerm in machine   1996   Patient Active Problem List   Diagnosis Date Noted   Closed bimalleolar fracture of left ankle 02/05/2023   Vitamin D deficiency 04/25/2020   IGT (impaired glucose tolerance) 04/25/2020   Obesity, unspecified 05/18/2014   Hyperlipemia 05/18/2014   Hyperglycemia 05/18/2014   Allergic rhinitis 12/15/2007    GERD 12/15/2007    PCP: Chaya Jan, MD  REFERRING PROVIDER: Barnie Del, NP  REFERRING DIAG: [phone] (ICD-10-CM) - Closed bimalleolar fracture of left ankle   THERAPY DIAG:  Stiffness of left ankle, not elsewhere classified  Pain in left ankle and joints of left foot  Muscle weakness (generalized)  Localized edema  Other abnormalities of gait and mobility  Unsteadiness on feet  Rationale for Evaluation and Treatment: Rehabilitation  ONSET DATE: 02/05/2023 surgery ORIF  SUBJECTIVE:   SUBJECTIVE STATEMENT: Lt arm is still hurting; Rt is fine.  Otherwise no complaints  PERTINENT HISTORY: Left ankle bimalleolar fx with ORIF 02/05/2023, asthma, HTN, Raynaud's disease, knee surgery left 1990's  PAIN:  NPRS scale: today 4/10 Pain location: left ankle posterior, foot forefoot & arch Pain description: throbbing Aggravating factors: standing & walking Relieving factors: tylenol, elevation & compression sock  PRECAUTIONS: Fall  WEIGHT BEARING RESTRICTIONS: Yes 03/17/2023 office note LLE WBAT in ASO  FALLS:  Has patient fallen in last 6 months? Yes. 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   Next MD visit:  04/10/2023  OBJECTIVE:  DIAGNOSTIC FINDINGS: 03/04/2023 Radiographs of the left ankle show stable alignment of fixation hardware for bimalleolar ankle fracture.  No complicating feature.   PATIENT SURVEYS:  Eval: FOTO intake:  38%  predicted:  64%  COGNITION: Overall cognitive status: WFL    SENSATION: WFL  EDEMA:  Circumferential:  LLE: above  malleoli  26.5 cm,  around ankle heel to dorsum 36.4 cm, midfoot 27.6 cm RLE: above malleoli  22 cm,  around ankle heel to dorsum 32.1 cm, midfoot 24.6 cm  POSTURE: rounded shoulders, forward head, flexed trunk , and weight shift right  PALPATION: Tenderness medial & lateral ankle; tenderness forefoot dorsum & plantar surface  LOWER EXTREMITY ROM:   ROM Left eval Left  03/28/23  Ankle dorsiflexion Supine  Knee ext A: -22* P: -19* Knee flex A: -15* P: -13* Supine  Knee ext A: -15* P: -8*  Ankle plantarflexion Supine A: 25* P: 29* Supine A: 38* P: 45*  Ankle inversion Supine P: 2* Supine P: 16*  Ankle eversion Supine  P: 1* Supine P: 10*   (Blank rows = not tested)  LOWER EXTREMITY MMT:  MMT Left eval  Hip flexion   Hip extension   Hip abduction   Hip adduction   Hip internal rotation   Hip external rotation   Knee flexion   Knee extension   Ankle dorsiflexion 3-/5  Ankle plantarflexion 3-/5  Ankle inversion 3-/5  Ankle eversion 3-/5   (Blank rows = not tested)  FUNCTIONAL TESTS:  18 inch chair transfer: requires use of armrests to arise.  Needed PT cues to limit LLE weight bearing per MD office visit  GAIT: Distance walked: 100' Assistive device utilized: Environmental consultant - 2 wheeled Level of assistance: SBA Comments: pt arrived to PT using RW & CAM boot with almost full weight bearing including taking 2 steps away from RW (no UE support)   TODAY'S TREATMENT DATE: 03/28/23 TherEx NuStep L6 x 10 min; UE/LE with focus on keeping Lt heel down Incline board stretch 3x30 sec Sit to/from stand x 10 reps from elevated surface ROM measurements - see above   Neuro Re-Ed Standing on foam for ankle strategy with horizontal and vertical head turns x 10 each direction; wide BOS Narrow BOS static standing 5x10 sec; EC 2x10 sec  Gait Training Amb 100' in clinic with [facility] with good sequencing and supervision; discussed where to purchase in preparation for transition to  [facility]  03/25/2023: Therapeutic Exercise: Nustep seat 9 level 5 with BLEs/BUEs 8 min Standing gastroc & soleus stretch 30 sec hold 2 reps each. Balance activities to facilitate LLE reactionary movements. Standing on foam hip width with equal weight bearing BLEs - head movements 10 reps right/left, up/down & diagonals. And static eyes closed 10 sec 3 reps.  Gastroc stretch on incline board 30 sec 2 reps Sit to stand from 24" bar stool without UE support 10 reps.  Therapeutic Activities: PT demo & verbal cues on loading / unloading RW in car. Pt has LLE ankle issue and is not taking narcotic meds so should be able to return to driving.  PT demo & verbal cues on using folding 24" bar stool in kitchen for modified stand for ADLs. Pt verbalized understanding.  PT demo & verbal cues on standing with  equal weight bearing with light LUE support (facilitate equal weight) with eyes closed to pray. When in church, do not use first row so something is in front of her to touch. Pt verbalized understanding.    Gait Training: PT reviewed with demo & verbal cues on how to properly don ASO. Pt verbalized understanding. PT demo & verbal cues on not staring at floor for upright posture to limit UE weight bearing which should help shoulder pain. Pt amb 50' & 100' with RW & LLE ASO WBAT with less shoulder pain.    03/19/2023 Therapeutic Exercise: Nustep seat 9 level 5 with BLEs/BUEs 8 min Standing gastroc & soleus stretch 30 sec hold 2 reps each. PT added to HEP with HO, verbal & demo cues. Pt verbalized & return demo understanding.   Gait Training: PT demo & verbal cues on how to properly don ASO. Pt verbalized understanding. Pre-gait at counter stepping LLE over 2" X 10" long step with terminal stance knee flexion / heel rise and stance knee ext weight bearing LLE. Gait with RW carryover of above and fluent step through pattern.  Upon arising bilateral stance weight shift right/left to weight LLE 5 reps. PT  demo & verbal cues on technique for curbs, ramps & stairs. Pt neg curb & ramp 2 reps ea with supervision. Pt neg stairs step to pattern 2 steps with 2 rails and 2 steps side stepping with BUE left (ascend side) rail with supervision.     03/17/23:  TherEx:  Transfer w/c to mat table CGA and min assist for stabilization w/c  Supine AP x 20 Supine ankle circles both direction x 20  Supine achilles stretch x 4 holding 30 sec Supine heel slides x 20  Supine: 4 way ankle exercises 2 x 10 c red TB Manual:  PROM to Left ankle to pt's tolerance Modalities:  Vaspneumatic: 34 deg x 10 minutes medium compression c LE elevated    PATIENT EDUCATION:  Education details: gait, HEP, POC Person educated: Patient Education method: Programmer, multimedia, Demonstration, Verbal cues, and Handouts Education comprehension: verbalized understanding, returned demonstration, and verbal cues required  HOME EXERCISE PROGRAM: Access Code: [email or web address] URL: [email or web address] Date: 03/19/2023 Prepared by: Vladimir Faster  Exercises - Ankle Alphabet in Elevation  - 3 x daily - 7 x weekly - 1 sets - 3-5 reps - 15 minutes elevation hold - Seated Calf Stretch with Strap  - 3 x daily - 7 x weekly - 1 sets - 3-5 reps - 30 seconds hold - Seated Soleus Stretch with Strap  - 3 x daily - 7 x weekly - 1 sets - 3-5 reps - 30 seconds hold - standing calf stretch with forefoot on small step or brick  - 2 x daily - 7 x weekly - 1 sets - 3 reps - 30 seconds hold - Standing Soleus Stretch on Foam 1/2 Roll  - 2 x daily - 7 x weekly - 1 sets - 3 reps - 30 seconds hold  ASSESSMENT: CLINICAL IMPRESSION: Pt tolerated session well today with good improvement in ROM noted.  Trial of amb with SPC and feel she will be able to transition to that soon.  Continue skilled PT to maximize function.   OBJECTIVE IMPAIRMENTS: Abnormal gait, decreased activity tolerance, decreased balance, decreased endurance, decreased knowledge of  condition, decreased knowledge of use of DME, decreased mobility, difficulty walking, decreased ROM, decreased strength, increased edema, impaired flexibility, postural dysfunction, obesity, and pain.   ACTIVITY LIMITATIONS: carrying, lifting, bending, standing,  squatting, sleeping, stairs, transfers, and locomotion level  PARTICIPATION LIMITATIONS: meal prep, cleaning, laundry, driving, community activity, and occupation  PERSONAL FACTORS: Age, Fitness, Time since onset of injury/illness/exacerbation, and 3+ comorbidities: see PMH  are also affecting patient's functional outcome.   REHAB POTENTIAL: Good  CLINICAL DECISION MAKING: Stable/uncomplicated  EVALUATION COMPLEXITY: Low   GOALS: Goals reviewed with patient? Yes  SHORT TERM GOALS: (target date for Short term goals 04/11/2023)   1.  Patient will demonstrate independent use of home exercise program to maintain progress from in clinic treatments. Baseline: See objective data Goal status: Ongoing 03/19/2023  2. PROM left ankle DF 0* Baseline: See objective data Goal status: Ongoing 03/19/2023  3. Interim FOTO >45% Baseline: See objective data Goal status: Ongoing 03/19/2023   LONG TERM GOALS: (target dates for all long term goals  06/06/2023 )   1. Patient will demonstrate/report pain at worst less than or equal to 2/10 to facilitate minimal limitation in daily activity secondary to pain symptoms. Baseline: See Objective data Goal status: Ongoing 03/19/2023   2. Patient will demonstrate independent use of home exercise program to facilitate ability to maintain/progress functional gains from skilled physical therapy services. Baseline: See Objective data Goal status: Ongoing 03/19/2023   3. Patient will demonstrate FOTO outcome > or = 64 % to indicate reduced disability due to condition. Baseline: See Objective data Goal status: Ongoing 03/19/2023   4.  Patient will demonstrate left ankle MMT >/= 4/5 to faciltiate usual  transfers, stairs, squatting at [facility] for daily life.  Baseline: See Objective data Goal status: Ongoing 03/19/2023   5.  Left ankle AROM DF >15* and PF >45* Baseline: See Objective data Goal status: Ongoing 03/19/2023   6.  patient ambulates without assistive device >1000' and negotiates ramps, curbs & stairs single rail independently.  Baseline: See Objective data Goal status: Ongoing 03/19/2023    PLAN:  PT FREQUENCY:  2x/week  PT DURATION: 12 weeks  PLANNED INTERVENTIONS: Therapeutic exercises, Therapeutic activity, Neuro Muscular re-education, Balance training, Gait training, Patient/Family education, Joint mobilization, Stair training, DME instructions, Dry Needling, Electrical stimulation, Traction, Cryotherapy, vasopneumatic deviceMoist heat, Taping, Ultrasound, Ionotophoresis 4mg /ml Dexamethasone, and aquatic therapy, Manual therapy.  All included unless contraindicated  PLAN FOR NEXT SESSION: gait training with SPC,  continue to progress standing exercises and balance activities.  manual therapy for range, consider vaso to end session. WBAT LLE in ASO    Clarita Crane, PT, DPT 03/28/23 11:09 AM

## 2023-04-01 ENCOUNTER — Encounter: Payer: Self-pay | Admitting: Physical Therapy

## 2023-04-01 ENCOUNTER — Ambulatory Visit: Payer: Medicare Other | Admitting: Physical Therapy

## 2023-04-01 DIAGNOSIS — M25572 Pain in left ankle and joints of left foot: Secondary | ICD-10-CM

## 2023-04-01 DIAGNOSIS — R2689 Other abnormalities of gait and mobility: Secondary | ICD-10-CM

## 2023-04-01 DIAGNOSIS — M6281 Muscle weakness (generalized): Secondary | ICD-10-CM | POA: Diagnosis not present

## 2023-04-01 DIAGNOSIS — R2681 Unsteadiness on feet: Secondary | ICD-10-CM

## 2023-04-01 DIAGNOSIS — M25672 Stiffness of left ankle, not elsewhere classified: Secondary | ICD-10-CM | POA: Diagnosis not present

## 2023-04-01 DIAGNOSIS — R600 Localized edema: Secondary | ICD-10-CM

## 2023-04-01 NOTE — Therapy (Signed)
OUTPATIENT PHYSICAL THERAPY TREATMENT  Patient Name: Holly Arellano MRN: [id number] DOB:11/03/1955, 67 y.o., female Today's Date: 04/01/2023  END OF SESSION:  PT End of Session - 04/01/23 1108     Visit Number 6    Number of Visits 25    Date for PT Re-Evaluation 06/06/23    Authorization Type BCBS Medicare    Authorization Time Period $10 copay    Progress Note Due on Visit 10    PT Start Time 1105    PT Stop Time 1147    PT Time Calculation (min) 42 min    Activity Tolerance Patient tolerated treatment well;Patient limited by pain    Behavior During Therapy [facility] for tasks assessed/performed                Past Medical History:  Diagnosis Date   Chronic maxillary sinusitis 12/15/2007   Qualifier: Diagnosis of  By: Lovell Sheehan MD, John E    Cough variant asthma 10/19/2008   Qualifier: Diagnosis of  By: Lovell Sheehan MD, John E    GERD (gastroesophageal reflux disease)    HEMORRHOIDS, INTERNAL 12/15/2007   Qualifier: Diagnosis of  By: Mayford Knife, [name], Domenic Polite    HIATAL HERNIA 12/15/2007   Qualifier: Diagnosis of  By: Mayford Knife, [name], [name]    Hypertension    MAMMOGRAM, ABNORMAL 08/01/2008   Qualifier: Diagnosis of  By: Mayford Knife [name], Domenic Polite    RAYNAUD'S DISEASE 12/15/2007   Qualifier: Diagnosis of  By: Mayford Knife [name], Domenic Polite    Past Surgical History:  Procedure Laterality Date   CHOLECYSTECTOMY     KNEE SURGERY  1994   ORIF ANKLE FRACTURE Left 02/05/2023   Procedure: OPEN REDUCTION INTERNAL FIXATION (ORIF) LEFT ANKLE FRACTURE W/ SCREWS AND PLATES;  Surgeon: Nadara Mustard, MD;  Location: [facility] OR;  Service: Orthopedics;  Laterality: Left;   trauma to aerm in machine   1996   Patient Active Problem List   Diagnosis Date Noted   Closed bimalleolar fracture of left ankle 02/05/2023   Vitamin D deficiency 04/25/2020   IGT (impaired glucose tolerance) 04/25/2020   Obesity, unspecified 05/18/2014   Hyperlipemia 05/18/2014   Hyperglycemia 05/18/2014   Allergic rhinitis  12/15/2007   GERD 12/15/2007    PCP: Chaya Jan, MD  REFERRING PROVIDER: Barnie Del, NP  REFERRING DIAG: [phone] (ICD-10-CM) - Closed bimalleolar fracture of left ankle   THERAPY DIAG:  Stiffness of left ankle, not elsewhere classified  Pain in left ankle and joints of left foot  Muscle weakness (generalized)  Localized edema  Other abnormalities of gait and mobility  Unsteadiness on feet  Rationale for Evaluation and Treatment: Rehabilitation  ONSET DATE: 02/05/2023 surgery ORIF  SUBJECTIVE:   SUBJECTIVE STATEMENT: She has been doing her exercises "somewhat."  She was on feet in kitchen more (~3 hours) sitting / standing 3 days ago.  Her ankle swelled a little and little pain increase.   PERTINENT HISTORY: Left ankle bimalleolar fx with ORIF 02/05/2023, asthma, HTN, Raynaud's disease, knee surgery left 1990's  PAIN:  NPRS scale: today 3/10 and since last PT 0/10 - 4-5/10 Pain location: left ankle posterior, foot forefoot & arch Pain description: throbbing Aggravating factors: standing & walking Relieving factors: tylenol, elevation & compression sock  PRECAUTIONS: Fall  WEIGHT BEARING RESTRICTIONS: Yes 03/17/2023 office note LLE WBAT in ASO  FALLS:  Has patient fallen in last 6 months? Yes. 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   Next MD visit:  04/10/2023  OBJECTIVE:  DIAGNOSTIC FINDINGS: 03/04/2023 Radiographs of the left ankle show stable alignment of fixation hardware for bimalleolar ankle fracture.  No complicating feature.   PATIENT  SURVEYS:  Eval: FOTO intake:  38%  predicted:  64%  COGNITION: Overall cognitive status: WFL    SENSATION: WFL  EDEMA:  Circumferential:  LLE: above malleoli  26.5 cm,  around ankle heel to dorsum 36.4 cm, midfoot 27.6 cm RLE: above malleoli  22 cm,  around ankle heel to dorsum 32.1 cm, midfoot 24.6 cm  POSTURE: rounded shoulders, forward head, flexed trunk , and weight shift right  PALPATION: Tenderness medial & lateral ankle; tenderness forefoot dorsum & plantar surface  LOWER EXTREMITY ROM:   ROM Left eval Left  03/28/23  Ankle dorsiflexion Supine  Knee ext A: -22* P: -19* Knee flex A: -15* P: -13* Supine  Knee ext A: -15* P: -8*  Ankle plantarflexion Supine A: 25* P: 29* Supine A: 38* P: 45*  Ankle inversion Supine P: 2* Supine P: 16*  Ankle eversion Supine  P: 1* Supine P: 10*   (Blank rows = not tested)  LOWER EXTREMITY MMT:  MMT Left eval  Hip flexion   Hip extension   Hip abduction   Hip adduction   Hip internal rotation   Hip external rotation   Knee flexion   Knee extension   Ankle dorsiflexion 3-/5  Ankle plantarflexion 3-/5  Ankle inversion 3-/5  Ankle eversion 3-/5   (Blank rows = not tested)  FUNCTIONAL TESTS:  18 inch chair transfer: requires use of armrests to arise.  Needed PT cues to limit LLE weight bearing per MD office visit  GAIT: Distance walked: 100' Assistive device utilized: Environmental consultant - 2 wheeled Level of assistance: SBA Comments: pt arrived to PT using RW & CAM boot with almost full weight bearing including taking 2 steps away from RW (no UE support)   TODAY'S TREATMENT DATE: 04/01/2023: TherEx NuStep seat 9 L7 with BLEs / BUEs x 3 min; BLEs only level 5 for 5 min. with focus on keeping Lt heel down Incline board stretch 3x30 sec Sit to/from stand x 10 reps from 24" elevated surface   Neuro Re-Ed Tandem stance on foam beam LLE in front & in back 30 sec each 2 reps PT demo & verbal cues on standing on floor tandem  stance near sink counting "1 [place]..." freezing count if touches, counting up to 30. Pt verbalized & return demo understanding. Standing crossways on foam beam wide BOSfor ankle strategy with horizontal, vertical and diagonal head turns eyes open x 10 each direction;  Standing crossways on foam beam wide BOS with eyes closed static 10 sec 3 reps with close supervision.   Gait Training Amb 100' in clinic with [facility] with good sequencing and supervision initially then modified independent. Patient had purchased but not opened yet a folding cane.  PT advised that shaft can rotate causing balance issues. Pt amb with std tip & with stand alone tip on cane. Pt preferred stand alone tip and was slightly more stable.   03/28/23 TherEx NuStep L6 x 10 min; UE/LE  with focus on keeping Lt heel down Incline board stretch 3x30 sec Sit to/from stand x 10 reps from elevated surface ROM measurements - see above   Neuro Re-Ed Standing on foam for ankle strategy with horizontal and vertical head turns x 10 each direction; wide BOS Narrow BOS static standing 5x10 sec; EC 2x10 sec  Gait Training Amb 100' in clinic with [facility] with good sequencing and supervision; discussed where to purchase in preparation for transition to [facility]  03/25/2023: Therapeutic Exercise: Nustep seat 9 level 5 with BLEs/BUEs 8 min Standing gastroc & soleus stretch 30 sec hold 2 reps each. Balance activities to facilitate LLE reactionary movements. Standing on foam hip width with equal weight bearing BLEs - head movements 10 reps right/left, up/down & diagonals. And static eyes closed 10 sec 3 reps.  Gastroc stretch on incline board 30 sec 2 reps Sit to stand from 24" bar stool without UE support 10 reps.  Therapeutic Activities: PT demo & verbal cues on loading / unloading RW in car. Pt has LLE ankle issue and is not taking narcotic meds so should be able to return to driving.  PT demo & verbal cues on using folding 24" bar stool in  kitchen for modified stand for ADLs. Pt verbalized understanding.  PT demo & verbal cues on standing with equal weight bearing with light LUE support (facilitate equal weight) with eyes closed to pray. When in church, do not use first row so something is in front of her to touch. Pt verbalized understanding.    Gait Training: PT reviewed with demo & verbal cues on how to properly don ASO. Pt verbalized understanding. PT demo & verbal cues on not staring at floor for upright posture to limit UE weight bearing which should help shoulder pain. Pt amb 50' & 100' with RW & LLE ASO WBAT with less shoulder pain.      PATIENT EDUCATION:  Education details: gait, HEP, POC Person educated: Patient Education method: Explanation, Demonstration, Verbal cues, and Handouts Education comprehension: verbalized understanding, returned demonstration, and verbal cues required  HOME EXERCISE PROGRAM: Access Code: [email or web address] URL: [email or web address] Date: 03/19/2023 Prepared by: Vladimir Faster  Exercises - Ankle Alphabet in Elevation  - 3 x daily - 7 x weekly - 1 sets - 3-5 reps - 15 minutes elevation hold - Seated Calf Stretch with Strap  - 3 x daily - 7 x weekly - 1 sets - 3-5 reps - 30 seconds hold - Seated Soleus Stretch with Strap  - 3 x daily - 7 x weekly - 1 sets - 3-5 reps - 30 seconds hold - standing calf stretch with forefoot on small step or brick  - 2 x daily - 7 x weekly - 1 sets - 3 reps - 30 seconds hold - Standing Soleus Stretch on Foam 1/2 Roll  - 2 x daily - 7 x weekly - 1 sets - 3 reps - 30 seconds hold  ASSESSMENT: CLINICAL IMPRESSION: Patient's balance including ankle strategy continues to improve with PT intervention.  She is tolerating increased weight bearing with ASO without pain increase noted.   Continue skilled PT to maximize function.   OBJECTIVE IMPAIRMENTS: Abnormal gait, decreased activity tolerance, decreased balance, decreased endurance, decreased knowledge  of condition, decreased knowledge of use of DME, decreased mobility, difficulty walking, decreased ROM, decreased strength, increased edema, impaired flexibility, postural dysfunction, obesity, and pain.   ACTIVITY LIMITATIONS: carrying, lifting, bending, standing, squatting, sleeping, stairs, transfers, and locomotion level  PARTICIPATION LIMITATIONS: meal  prep, cleaning, laundry, driving, community activity, and occupation  PERSONAL FACTORS: Age, Fitness, Time since onset of injury/illness/exacerbation, and 3+ comorbidities: see PMH  are also affecting patient's functional outcome.   REHAB POTENTIAL: Good  CLINICAL DECISION MAKING: Stable/uncomplicated  EVALUATION COMPLEXITY: Low   GOALS: Goals reviewed with patient? Yes  SHORT TERM GOALS: (target date for Short term goals 04/11/2023)   1.  Patient will demonstrate independent use of home exercise program to maintain progress from in clinic treatments. Baseline: See objective data Goal status: Ongoing 04/01/2023  2. PROM left ankle DF 0* Baseline: See objective data Goal status: Ongoing 04/01/2023  3. Interim FOTO >45% Baseline: See objective data Goal status: Ongoing 04/01/2023   LONG TERM GOALS: (target dates for all long term goals  06/06/2023 )   1. Patient will demonstrate/report pain at worst less than or equal to 2/10 to facilitate minimal limitation in daily activity secondary to pain symptoms. Baseline: See Objective data Goal status: Ongoing 04/01/2023   2. Patient will demonstrate independent use of home exercise program to facilitate ability to maintain/progress functional gains from skilled physical therapy services. Baseline: See Objective data Goal status: Ongoing 04/01/2023   3. Patient will demonstrate FOTO outcome > or = 64 % to indicate reduced disability due to condition. Baseline: See Objective data Goal status: Ongoing 04/01/2023   4.  Patient will demonstrate left ankle MMT >/= 4/5 to faciltiate usual  transfers, stairs, squatting at [facility] for daily life.  Baseline: See Objective data Goal status: Ongoing 04/01/2023   5.  Left ankle AROM DF >15* and PF >45* Baseline: See Objective data Goal status: Ongoing 04/01/2023   6.  patient ambulates without assistive device >1000' and negotiates ramps, curbs & stairs single rail independently.  Baseline: See Objective data Goal status: Ongoing 04/01/2023    PLAN:  PT FREQUENCY:  2x/week  PT DURATION: 12 weeks  PLANNED INTERVENTIONS: Therapeutic exercises, Therapeutic activity, Neuro Muscular re-education, Balance training, Gait training, Patient/Family education, Joint mobilization, Stair training, DME instructions, Dry Needling, Electrical stimulation, Traction, Cryotherapy, vasopneumatic deviceMoist heat, Taping, Ultrasound, Ionotophoresis 4mg /ml Dexamethasone, and aquatic therapy, Manual therapy.  All included unless contraindicated  PLAN FOR NEXT SESSION:  TUG with cane, continue gait training with SPC instructing in ramp & curb,  continue to progress standing exercises and balance activities.  manual therapy for range, consider vaso to end session. WBAT LLE in ASO    Vladimir Faster, PT, DPT 04/01/2023, 1:39 PM

## 2023-04-02 NOTE — Therapy (Unsigned)
OUTPATIENT PHYSICAL THERAPY TREATMENT  Patient Name: Holly Arellano MRN: [id number] DOB:November 21, 1955, 67 y.o., female Today's Date: 04/03/2023  END OF SESSION:  PT End of Session - 04/03/23 1106     Visit Number 7    Number of Visits 25    Date for PT Re-Evaluation 06/06/23    Authorization Type BCBS Medicare    Authorization Time Period $10 copay    Progress Note Due on Visit 10    PT Start Time 1101    PT Stop Time 1155    PT Time Calculation (min) 54 min    Activity Tolerance Patient tolerated treatment well;Patient limited by pain    Behavior During Therapy [facility] for tasks assessed/performed                 Past Medical History:  Diagnosis Date   Chronic maxillary sinusitis 12/15/2007   Qualifier: Diagnosis of  By: Lovell Sheehan MD, John E    Cough variant asthma 10/19/2008   Qualifier: Diagnosis of  By: Lovell Sheehan MD, John E    GERD (gastroesophageal reflux disease)    HEMORRHOIDS, INTERNAL 12/15/2007   Qualifier: Diagnosis of  By: Mayford Knife, [name], Domenic Polite    HIATAL HERNIA 12/15/2007   Qualifier: Diagnosis of  By: Mayford Knife, [name], [name]    Hypertension    MAMMOGRAM, ABNORMAL 08/01/2008   Qualifier: Diagnosis of  By: Mayford Knife [name], Domenic Polite    RAYNAUD'S DISEASE 12/15/2007   Qualifier: Diagnosis of  By: Mayford Knife [name], Domenic Polite    Past Surgical History:  Procedure Laterality Date   CHOLECYSTECTOMY     KNEE SURGERY  1994   ORIF ANKLE FRACTURE Left 02/05/2023   Procedure: OPEN REDUCTION INTERNAL FIXATION (ORIF) LEFT ANKLE FRACTURE W/ SCREWS AND PLATES;  Surgeon: Nadara Mustard, MD;  Location: [facility] OR;  Service: Orthopedics;  Laterality: Left;   trauma to aerm in machine   1996   Patient Active Problem List   Diagnosis Date Noted   Closed bimalleolar fracture of left ankle 02/05/2023   Vitamin D deficiency 04/25/2020   IGT (impaired glucose tolerance) 04/25/2020   Obesity, unspecified 05/18/2014   Hyperlipemia 05/18/2014   Hyperglycemia 05/18/2014   Allergic rhinitis  12/15/2007   GERD 12/15/2007    PCP: Chaya Jan, MD  REFERRING PROVIDER: Barnie Del, NP  REFERRING DIAG: [phone] (ICD-10-CM) - Closed bimalleolar fracture of left ankle   THERAPY DIAG:  Stiffness of left ankle, not elsewhere classified  Pain in left ankle and joints of left foot  Muscle weakness (generalized)  Localized edema  Other abnormalities of gait and mobility  Unsteadiness on feet  Rationale for Evaluation and Treatment: Rehabilitation  ONSET DATE: 02/05/2023 surgery ORIF  SUBJECTIVE:   SUBJECTIVE STATEMENT: States she is doing her HEP once a day. She isn't sure if her brace is too tight and cutting into her leg.   PERTINENT HISTORY: Left ankle bimalleolar fx with ORIF 02/05/2023, asthma, HTN, Raynaud's disease, knee surgery left 1990's  PAIN:  NPRS scale: today 4/10 and since last PT 0/10 - 4-5/10 Pain location: left ankle posterior, foot forefoot & arch Pain description: throbbing Aggravating factors: standing & walking Relieving factors: tylenol, elevation & compression sock  PRECAUTIONS: Fall  WEIGHT BEARING RESTRICTIONS: Yes 03/17/2023 office note LLE WBAT in ASO  FALLS:  Has patient fallen in last 6 months? Yes. 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   Next MD visit:  04/10/2023  OBJECTIVE:  DIAGNOSTIC FINDINGS: 03/04/2023 Radiographs of the left ankle show stable alignment of fixation hardware for bimalleolar ankle fracture.  No complicating feature.   PATIENT SURVEYS:  Eval: FOTO intake:  38%  predicted:  64%  COGNITION: Overall  cognitive status: WFL    SENSATION: WFL  EDEMA:  Circumferential:  LLE: above malleoli  26.5 cm,  around ankle heel to dorsum 36.4 cm, midfoot 27.6 cm RLE: above malleoli  22 cm,  around ankle heel to dorsum 32.1 cm, midfoot 24.6 cm  POSTURE: rounded shoulders, forward head, flexed trunk , and weight shift right  PALPATION: Tenderness medial & lateral ankle; tenderness forefoot dorsum & plantar surface  LOWER EXTREMITY ROM:   ROM Left eval Left  03/28/23  Ankle dorsiflexion Supine  Knee ext A: -22* P: -19* Knee flex A: -15* P: -13* Supine  Knee ext A: -15* P: -8*  Ankle plantarflexion Supine A: 25* P: 29* Supine A: 38* P: 45*  Ankle inversion Supine P: 2* Supine P: 16*  Ankle eversion Supine  P: 1* Supine P: 10*   (Blank rows = not tested)  LOWER EXTREMITY MMT:  MMT Left eval  Hip flexion   Hip extension   Hip abduction   Hip adduction   Hip internal rotation   Hip external rotation   Knee flexion   Knee extension   Ankle dorsiflexion 3-/5  Ankle plantarflexion 3-/5  Ankle inversion 3-/5  Ankle eversion 3-/5   (Blank rows = not tested)  FUNCTIONAL TESTS:  18 inch chair transfer: requires use of armrests to arise.  Needed PT cues to limit LLE weight bearing per MD office visit 04/03/23: TUG 22.8 sec, SPC GAIT: Distance walked: 100' Assistive device utilized: Environmental consultant - 2 wheeled Level of assistance: SBA Comments: pt arrived to PT using RW & CAM boot with almost full weight bearing including taking 2 steps away from RW (no UE support)   TODAY'S TREATMENT DATE: 04/03/23 There-ex Standing hip abduction 1x10 reps, yellow TB around lower leg 1x10 reps each side Standing hip extension x10 reps yellow TB around lower leg TUG: 22.8 sec, SPC  Neuro Re-ed NBOS on foam EO/EC 2x20 sec each NBOS on foam EO head turns Lt/Rt, up/down x10 reps   Gait training Amb 100' with SPC verbal cues to increased Rt step length, SPC placement- ModI Navigating curb  with SPC and leading with the correct LE and SPC placement, CGA and one near LOB stepping down from curb. Reviewed side stepping down the curb to improve stability for now Up/down ramp x2 with SPC, PT cuing for [facility] placement, CGA  Vaso end of session 34deg, medium compression, x10 min     04/01/2023: TherEx NuStep seat 9 L7 with BLEs / BUEs x 3 min; BLEs only level 5 for 5 min. with focus on keeping Lt heel down Incline board stretch 3x30 sec Sit to/from stand x 10 reps from 24" elevated surface   Neuro Re-Ed Tandem stance on foam beam LLE in front & in back 30 sec each 2 reps PT demo & verbal cues on standing on floor tandem stance near sink counting "1 [place]..." freezing count if touches, counting  up to 30. Pt verbalized & return demo understanding. Standing crossways on foam beam wide BOSfor ankle strategy with horizontal, vertical and diagonal head turns eyes open x 10 each direction;  Standing crossways on foam beam wide BOS with eyes closed static 10 sec 3 reps with close supervision.   Gait Training Amb 100' in clinic with [facility] with good sequencing and supervision initially then modified independent. Patient had purchased but not opened yet a folding cane.  PT advised that shaft can rotate causing balance issues. Pt amb with std tip & with stand alone tip on cane. Pt preferred stand alone tip and was slightly more stable.   03/28/23 TherEx NuStep L6 x 10 min; UE/LE with focus on keeping Lt heel down Incline board stretch 3x30 sec Sit to/from stand x 10 reps from elevated surface ROM measurements - see above   Neuro Re-Ed Standing on foam for ankle strategy with horizontal and vertical head turns x 10 each direction; wide BOS Narrow BOS static standing 5x10 sec; EC 2x10 sec  Gait Training Amb 100' in clinic with [facility] with good sequencing and supervision; discussed where to purchase in preparation for transition to [facility]  03/25/2023: Therapeutic Exercise: Nustep seat 9 level 5  with BLEs/BUEs 8 min Standing gastroc & soleus stretch 30 sec hold 2 reps each. Balance activities to facilitate LLE reactionary movements. Standing on foam hip width with equal weight bearing BLEs - head movements 10 reps right/left, up/down & diagonals. And static eyes closed 10 sec 3 reps.  Gastroc stretch on incline board 30 sec 2 reps Sit to stand from 24" bar stool without UE support 10 reps.  Therapeutic Activities: PT demo & verbal cues on loading / unloading RW in car. Pt has LLE ankle issue and is not taking narcotic meds so should be able to return to driving.  PT demo & verbal cues on using folding 24" bar stool in kitchen for modified stand for ADLs. Pt verbalized understanding.  PT demo & verbal cues on standing with equal weight bearing with light LUE support (facilitate equal weight) with eyes closed to pray. When in church, do not use first row so something is in front of her to touch. Pt verbalized understanding.    Gait Training: PT reviewed with demo & verbal cues on how to properly don ASO. Pt verbalized understanding. PT demo & verbal cues on not staring at floor for upright posture to limit UE weight bearing which should help shoulder pain. Pt amb 50' & 100' with RW & LLE ASO WBAT with less shoulder pain.      PATIENT EDUCATION:  Education details: gait, HEP, POC Person educated: Patient Education method: Explanation, Demonstration, Verbal cues, and Handouts Education comprehension: verbalized understanding, returned demonstration, and verbal cues required  HOME EXERCISE PROGRAM: Access Code: [email or web address] URL: [email or web address] Date: 03/19/2023 Prepared by: Vladimir Faster  Exercises - Ankle Alphabet in Elevation  - 3 x daily - 7 x weekly - 1 sets - 3-5 reps - 15 minutes elevation hold - Seated Calf Stretch with Strap  - 3 x daily - 7 x weekly - 1 sets - 3-5 reps - 30 seconds hold - Seated Soleus Stretch with Strap  - 3 x daily - 7 x weekly - 1  sets - 3-5 reps - 30 seconds hold - standing calf stretch with forefoot on small step or brick  - 2 x daily - 7 x weekly - 1 sets - 3 reps - 30 seconds hold -  Standing Soleus Stretch on Foam 1/2 Roll  - 2 x daily - 7 x weekly - 1 sets - 3 reps - 30 seconds hold  ASSESSMENT: CLINICAL IMPRESSION: Today's session continued with activity to improve understanding and safety with SPC. TUG was completed in 22 seconds. Pt required PT verbal cues for proper placement of SPC and safety awareness during curb and ramp navigation. Pt had mild increase in Lt ankle pain end of session and was agreeable to game ready for swelling and pain management. PT also reviewed proper way to don her brace, since she was having issues with this cutting into her lower leg. Pt had immediate relief of pain following re-lacing of her brace.   OBJECTIVE IMPAIRMENTS: Abnormal gait, decreased activity tolerance, decreased balance, decreased endurance, decreased knowledge of condition, decreased knowledge of use of DME, decreased mobility, difficulty walking, decreased ROM, decreased strength, increased edema, impaired flexibility, postural dysfunction, obesity, and pain.   ACTIVITY LIMITATIONS: carrying, lifting, bending, standing, squatting, sleeping, stairs, transfers, and locomotion level  PARTICIPATION LIMITATIONS: meal prep, cleaning, laundry, driving, community activity, and occupation  PERSONAL FACTORS: Age, Fitness, Time since onset of injury/illness/exacerbation, and 3+ comorbidities: see PMH  are also affecting patient's functional outcome.   REHAB POTENTIAL: Good  CLINICAL DECISION MAKING: Stable/uncomplicated  EVALUATION COMPLEXITY: Low   GOALS: Goals reviewed with patient? Yes  SHORT TERM GOALS: (target date for Short term goals 04/11/2023)   1.  Patient will demonstrate independent use of home exercise program to maintain progress from in clinic treatments. Baseline: See objective data Goal status: Ongoing  04/01/2023  2. PROM left ankle DF 0* Baseline: See objective data Goal status: Ongoing 04/01/2023  3. Interim FOTO >45% Baseline: See objective data Goal status: Ongoing 04/01/2023   LONG TERM GOALS: (target dates for all long term goals  06/06/2023 )   1. Patient will demonstrate/report pain at worst less than or equal to 2/10 to facilitate minimal limitation in daily activity secondary to pain symptoms. Baseline: See Objective data Goal status: Ongoing 04/01/2023   2. Patient will demonstrate independent use of home exercise program to facilitate ability to maintain/progress functional gains from skilled physical therapy services. Baseline: See Objective data Goal status: Ongoing 04/01/2023   3. Patient will demonstrate FOTO outcome > or = 64 % to indicate reduced disability due to condition. Baseline: See Objective data Goal status: Ongoing 04/01/2023   4.  Patient will demonstrate left ankle MMT >/= 4/5 to faciltiate usual transfers, stairs, squatting at [facility] for daily life.  Baseline: See Objective data Goal status: Ongoing 04/01/2023   5.  Left ankle AROM DF >15* and PF >45* Baseline: See Objective data Goal status: Ongoing 04/01/2023   6.  patient ambulates without assistive device >1000' and negotiates ramps, curbs & stairs single rail independently.  Baseline: See Objective data Goal status: Ongoing 04/01/2023    PLAN:  PT FREQUENCY:  2x/week  PT DURATION: 12 weeks  PLANNED INTERVENTIONS: Therapeutic exercises, Therapeutic activity, Neuro Muscular re-education, Balance training, Gait training, Patient/Family education, Joint mobilization, Stair training, DME instructions, Dry Needling, Electrical stimulation, Traction, Cryotherapy, vasopneumatic deviceMoist heat, Taping, Ultrasound, Ionotophoresis 4mg /ml Dexamethasone, and aquatic therapy, Manual therapy.  All included unless contraindicated  PLAN FOR NEXT SESSION:  continue gait training with SPC instructing in ramp & curb,   continue to progress standing exercises and balance activities.  manual therapy for range, consider vaso to end session. WBAT LLE in ASO   12:28 PM,04/03/23 Donita Brooks PT, DPT [facility] [place]  [phone]

## 2023-04-03 ENCOUNTER — Encounter: Payer: Self-pay | Admitting: Physical Therapy

## 2023-04-03 ENCOUNTER — Ambulatory Visit: Payer: Medicare Other | Admitting: Physical Therapy

## 2023-04-03 DIAGNOSIS — M25672 Stiffness of left ankle, not elsewhere classified: Secondary | ICD-10-CM | POA: Diagnosis not present

## 2023-04-03 DIAGNOSIS — R2689 Other abnormalities of gait and mobility: Secondary | ICD-10-CM

## 2023-04-03 DIAGNOSIS — R600 Localized edema: Secondary | ICD-10-CM

## 2023-04-03 DIAGNOSIS — M6281 Muscle weakness (generalized): Secondary | ICD-10-CM

## 2023-04-03 DIAGNOSIS — R2681 Unsteadiness on feet: Secondary | ICD-10-CM

## 2023-04-03 DIAGNOSIS — M25572 Pain in left ankle and joints of left foot: Secondary | ICD-10-CM

## 2023-04-08 ENCOUNTER — Encounter: Payer: Self-pay | Admitting: Physical Therapy

## 2023-04-08 ENCOUNTER — Ambulatory Visit: Payer: Medicare Other | Admitting: Physical Therapy

## 2023-04-08 DIAGNOSIS — M6281 Muscle weakness (generalized): Secondary | ICD-10-CM | POA: Diagnosis not present

## 2023-04-08 DIAGNOSIS — R600 Localized edema: Secondary | ICD-10-CM

## 2023-04-08 DIAGNOSIS — M25672 Stiffness of left ankle, not elsewhere classified: Secondary | ICD-10-CM

## 2023-04-08 DIAGNOSIS — R2681 Unsteadiness on feet: Secondary | ICD-10-CM

## 2023-04-08 DIAGNOSIS — R2689 Other abnormalities of gait and mobility: Secondary | ICD-10-CM

## 2023-04-08 DIAGNOSIS — M25572 Pain in left ankle and joints of left foot: Secondary | ICD-10-CM

## 2023-04-08 NOTE — Therapy (Signed)
OUTPATIENT PHYSICAL THERAPY TREATMENT & PROGRESS NOTE  Patient Name: Paisely Brick MRN: [id number] DOB:Jan 13, 1956, 67 y.o., female Today's Date: 04/08/2023  Progress Note Reporting Period 03/12/2023 to 04/08/2023  See note below for Objective Data and Assessment of Progress/Goals.      END OF SESSION:  PT End of Session - 04/08/23 1055     Visit Number 8    Number of Visits 25    Date for PT Re-Evaluation 06/06/23    Authorization Type BCBS Medicare    Authorization Time Period $10 copay    Progress Note Due on Visit 18    PT Start Time 1055    PT Stop Time 1145    PT Time Calculation (min) 50 min    Activity Tolerance Patient tolerated treatment well;Patient limited by pain    Behavior During Therapy [facility] for tasks assessed/performed                  Past Medical History:  Diagnosis Date   Chronic maxillary sinusitis 12/15/2007   Qualifier: Diagnosis of  By: Lovell Sheehan MD, John E    Cough variant asthma 10/19/2008   Qualifier: Diagnosis of  By: Lovell Sheehan MD, John E    GERD (gastroesophageal reflux disease)    HEMORRHOIDS, INTERNAL 12/15/2007   Qualifier: Diagnosis of  By: Mayford Knife, [name], Domenic Polite    HIATAL HERNIA 12/15/2007   Qualifier: Diagnosis of  By: Mayford Knife, [name], [name]    Hypertension    MAMMOGRAM, ABNORMAL 08/01/2008   Qualifier: Diagnosis of  By: Mayford Knife [name], Domenic Polite    RAYNAUD'S DISEASE 12/15/2007   Qualifier: Diagnosis of  By: Mayford Knife [name], Domenic Polite    Past Surgical History:  Procedure Laterality Date   CHOLECYSTECTOMY     KNEE SURGERY  1994   ORIF ANKLE FRACTURE Left 02/05/2023   Procedure: OPEN REDUCTION INTERNAL FIXATION (ORIF) LEFT ANKLE FRACTURE W/ SCREWS AND PLATES;  Surgeon: Nadara Mustard, MD;  Location: [facility] OR;  Service: Orthopedics;  Laterality: Left;   trauma to aerm in machine   1996   Patient Active Problem List   Diagnosis Date Noted   Closed bimalleolar fracture of left ankle 02/05/2023   Vitamin D deficiency 04/25/2020   IGT  (impaired glucose tolerance) 04/25/2020   Obesity, unspecified 05/18/2014   Hyperlipemia 05/18/2014   Hyperglycemia 05/18/2014   Allergic rhinitis 12/15/2007   GERD 12/15/2007    PCP: Chaya Jan, MD  REFERRING PROVIDER: Barnie Del, NP  REFERRING DIAG: [phone] (ICD-10-CM) - Closed bimalleolar fracture of left ankle   THERAPY DIAG:  Stiffness of left ankle, not elsewhere classified  Muscle weakness (generalized)  Pain in left ankle and joints of left foot  Localized edema  Other abnormalities of gait and mobility  Unsteadiness on feet  Rationale for Evaluation and Treatment: Rehabilitation  ONSET DATE: 02/05/2023 surgery ORIF  SUBJECTIVE:   SUBJECTIVE STATEMENT: She does not have cane yet.  She can stand on feet ~1 hour.    PERTINENT HISTORY: Left ankle bimalleolar fx with ORIF 02/05/2023, asthma, HTN, Raynaud's disease, knee surgery left 1990's  PAIN:  NPRS scale: today 5/10 and since last PT 0/10 - 6-7/10 Pain location: left ankle posterior, foot forefoot & arch Pain description: throbbing Aggravating factors: standing & walking Relieving factors: tylenol, elevation & compression sock  PRECAUTIONS: Fall  WEIGHT BEARING RESTRICTIONS: Yes 03/17/2023 office note LLE WBAT in ASO  FALLS:  Has patient fallen in last 6 months? Yes. 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   Next MD visit:  04/10/2023  OBJECTIVE:  DIAGNOSTIC FINDINGS: 03/04/2023 Radiographs of the left ankle show stable alignment of fixation hardware for bimalleolar ankle fracture.  No  complicating feature.   PATIENT SURVEYS:  03/12/2023 / Eval: FOTO intake:  38%  predicted:  64%  COGNITION: 03/12/2023  Overall cognitive status: WFL    SENSATION: WFL  EDEMA:  03/12/2023 Circumferential:  LLE: above malleoli  26.5 cm,  around ankle heel to dorsum 36.4 cm, midfoot 27.6 cm RLE: above malleoli  22 cm,  around ankle heel to dorsum 32.1 cm, midfoot 24.6 cm  POSTURE: 03/12/2023 rounded shoulders, forward head, flexed trunk , and weight shift right  PALPATION: 03/12/2023  Tenderness medial & lateral ankle; tenderness forefoot dorsum & plantar surface  LOWER EXTREMITY ROM:   ROM Left eval Left  03/28/23 Left 04/08/23  Ankle dorsiflexion Supine  Knee ext A: -22* P: -19* Knee flex A: -15* P: -13* Supine  Knee ext A: -15* P: -8* Seated Knee ext A: -13* P: -5* Knee flexed A: -11* P: - 3*  Ankle plantarflexion Supine A: 25* P: 29* Supine A: 38* P: 45* Seated  A: 40* P: 45*  Ankle inversion Supine P: 2* Supine P: 16*   Ankle eversion Supine  P: 1* Supine P: 10*    (Blank rows = not tested)  LOWER EXTREMITY MMT:  MMT Left eval Left 04/08/23  Hip flexion    Hip extension    Hip abduction    Hip adduction    Hip internal rotation    Hip external rotation    Knee flexion    Knee extension    Ankle dorsiflexion 3-/5 3+ within available ROM  Ankle plantarflexion 3-/5 seated 3+ within available ROM  Ankle inversion 3-/5 3+ within available ROM  Ankle eversion 3-/5 3+ within available ROM   (Blank rows = not tested)  FUNCTIONAL TESTS:  04/08/2023: SLS RLE: 7.41 sec  LLE 1.33 sec   18 inch chair transfer: requires use of armrests to arise.  Needed PT cues to limit LLE weight bearing per MD office visit 04/03/23: TUG 22.8 sec, SPC GAIT: 04/08/2023:  Pt amb >150' with cane with verbal cues only. Pt neg ramp & curb with cane with supervision.  Pt amb 25' without device except ASO with supervision.  Gait Velocity with Cane 1.83 ft/sec and with no  device 1.57 ft/sec   03/12/2023: Distance walked: 100' Assistive device utilized: Environmental consultant - 2 wheeled Level of assistance: SBA Comments: pt arrived to PT using RW & CAM boot with almost full weight bearing including taking 2 steps away from RW (no UE support)   TODAY'S TREATMENT DATE: 04/08/2023: Therapeutic Exercise: Leg Press BLEs 75# 10 reps 2 sets Squat 10 reps Tennis ball roll stretch for plantar fascia 1 min ea ant/post & med/lat Seated ankle PF red T-band 10 reps Seated ankle inversion red T-band 10 reps Seated ankle eversion red T-band 10 reps Seated ankle DF red T-band 10 reps PT added to HEP with demo, verbal & HO cues. Pt verbalized understanding.   Gait: Pt amb >150' with  cane with verbal cues only. Pt neg ramp & curb with cane with supervision.   Neuromuscular Reeducation: Working on SLS - alternate LEs tapping cone 1 min, then double tap 1 min Tandem stance on floor LLE in front & in back 30 sec ea without touching but instability noted; on foam 30 sec LLE in front & in back with intermittent touch.   04/03/23 There-ex Standing hip abduction 1x10 reps, yellow TB around lower leg 1x10 reps each side Standing hip extension x10 reps yellow TB around lower leg TUG: 22.8 sec, SPC  Neuro Re-ed NBOS on foam EO/EC 2x20 sec each NBOS on foam EO head turns Lt/Rt, up/down x10 reps   Gait training Amb 100' with SPC verbal cues to increased Rt step length, SPC placement- ModI Navigating curb with SPC and leading with the correct LE and SPC placement, CGA and one near LOB stepping down from curb. Reviewed side stepping down the curb to improve stability for now Up/down ramp x2 with SPC, PT cuing for [facility] placement, CGA  Vaso end of session 34deg, medium compression, x10 min     04/01/2023: TherEx NuStep seat 9 L7 with BLEs / BUEs x 3 min; BLEs only level 5 for 5 min. with focus on keeping Lt heel down Incline board stretch 3x30 sec Sit to/from stand x 10 reps from 24"  elevated surface   Neuro Re-Ed Tandem stance on foam beam LLE in front & in back 30 sec each 2 reps PT demo & verbal cues on standing on floor tandem stance near sink counting "1 [place]..." freezing count if touches, counting up to 30. Pt verbalized & return demo understanding. Standing crossways on foam beam wide BOSfor ankle strategy with horizontal, vertical and diagonal head turns eyes open x 10 each direction;  Standing crossways on foam beam wide BOS with eyes closed static 10 sec 3 reps with close supervision.   Gait Training Amb 100' in clinic with [facility] with good sequencing and supervision initially then modified independent. Patient had purchased but not opened yet a folding cane.  PT advised that shaft can rotate causing balance issues. Pt amb with std tip & with stand alone tip on cane. Pt preferred stand alone tip and was slightly more stable.      PATIENT EDUCATION:  Education details: gait, HEP, POC Person educated: Patient Education method: Explanation, Demonstration, Verbal cues, and Handouts Education comprehension: verbalized understanding, returned demonstration, and verbal cues required  HOME EXERCISE PROGRAM: Access Code: [email or web address] URL: [email or web address] Date: 04/08/2023 Prepared by: Vladimir Faster  Exercises - Ankle Alphabet in Elevation  - 3 x daily - 7 x weekly - 1 sets - 3-5 reps - 15 minutes elevation hold - Seated Calf Stretch with Strap  - 3 x daily - 7 x weekly - 1 sets - 3-5 reps - 30 seconds hold - Seated Soleus Stretch with Strap  - 3 x daily - 7 x weekly - 1 sets - 3-5 reps - 30 seconds hold - standing calf stretch with forefoot on small step or brick  - 2 x daily - 7 x weekly - 1 sets - 3 reps - 30 seconds hold - Standing Soleus Stretch on Foam 1/2 Roll  - 2 x daily - 7 x weekly - 1 sets - 3 reps - 30 seconds hold - Seated Plantar Fascia Mobilization with Small Ball  - 1 x daily - 5 x weekly - 1 sets - 10 reps - 5 seconds hold -  Seated  Ankle Plantarflexion with Resistance  - 1 x daily - 7 x weekly - 2 sets - 10 reps - 5 seconds hold - Seated Ankle Inversion with Anchored Resistance  - 1 x daily - 7 x weekly - 2 sets - 10 reps - 5 seconds hold - Seated Ankle Eversion with Anchored Resistance  - 1 x daily - 7 x weekly - 2 sets - 10 reps - 5 seconds hold - Long Sitting Ankle Dorsiflexion with Anchored Resistance  - 1 x daily - 7 x weekly - 2 sets - 10 reps - 5 seconds hold - Standing Tandem Balance with Counter Support  - 1 x daily - 7 x weekly - 1 sets - 2 reps - 30 seconds hold  ASSESSMENT: CLINICAL IMPRESSION: Patient has made significant progress with PT but needs further PT to maximize her mobility.  She still has pain limiting activities.  Patient appears ready to progress to standing activities and household without ASO if MD authorizes after X-ray & office visit.   OBJECTIVE IMPAIRMENTS: Abnormal gait, decreased activity tolerance, decreased balance, decreased endurance, decreased knowledge of condition, decreased knowledge of use of DME, decreased mobility, difficulty walking, decreased ROM, decreased strength, increased edema, impaired flexibility, postural dysfunction, obesity, and pain.   ACTIVITY LIMITATIONS: carrying, lifting, bending, standing, squatting, sleeping, stairs, transfers, and locomotion level  PARTICIPATION LIMITATIONS: meal prep, cleaning, laundry, driving, community activity, and occupation  PERSONAL FACTORS: Age, Fitness, Time since onset of injury/illness/exacerbation, and 3+ comorbidities: see PMH  are also affecting patient's functional outcome.   REHAB POTENTIAL: Good  CLINICAL DECISION MAKING: Stable/uncomplicated  EVALUATION COMPLEXITY: Low   GOALS: Goals reviewed with patient? Yes  SHORT TERM GOALS: (target date for Short term goals 04/11/2023)   1.  Patient will demonstrate independent use of home exercise program to maintain progress from in clinic treatments. Baseline: See  objective data Goal status: Ongoing 04/01/2023  2. PROM left ankle DF 0* Baseline: See objective data Goal status: Ongoing 04/01/2023  3. Interim FOTO >45% Baseline: See objective data Goal status: Ongoing 04/01/2023   LONG TERM GOALS: (target dates for all long term goals  06/06/2023 )   1. Patient will demonstrate/report pain at worst less than or equal to 2/10 to facilitate minimal limitation in daily activity secondary to pain symptoms. Baseline: See Objective data Goal status: Ongoing 04/01/2023   2. Patient will demonstrate independent use of home exercise program to facilitate ability to maintain/progress functional gains from skilled physical therapy services. Baseline: See Objective data Goal status: Ongoing 04/01/2023   3. Patient will demonstrate FOTO outcome > or = 64 % to indicate reduced disability due to condition. Baseline: See Objective data Goal status: Ongoing 04/01/2023   4.  Patient will demonstrate left ankle MMT >/= 4/5 to faciltiate usual transfers, stairs, squatting at [facility] for daily life.  Baseline: See Objective data Goal status: Ongoing 04/01/2023   5.  Left ankle AROM DF >15* and PF >45* Baseline: See Objective data Goal status: Ongoing 04/01/2023   6.  patient ambulates without assistive device >1000' and negotiates ramps, curbs & stairs single rail independently.  Baseline: See Objective data Goal status: Ongoing 04/01/2023    PLAN:  PT FREQUENCY:  2x/week  PT DURATION: 12 weeks  PLANNED INTERVENTIONS: Therapeutic exercises, Therapeutic activity, Neuro Muscular re-education, Balance training, Gait training, Patient/Family education, Joint mobilization, Stair training, DME instructions, Dry Needling, Electrical stimulation, Traction, Cryotherapy, vasopneumatic deviceMoist heat, Taping, Ultrasound, Ionotophoresis 4mg /ml Dexamethasone, and aquatic therapy, Manual therapy.  All  included unless contraindicated  PLAN FOR NEXT SESSION:  check MD note, check updated  HEP,  continue gait training with SPC instructing in ramp & curb,  continue to progress standing exercises and balance activities.  manual therapy for range, consider vaso to end session. WBAT LLE in ASO   Vladimir Faster, PT, DPT 04/08/2023, 12:59 PM

## 2023-04-10 ENCOUNTER — Encounter: Payer: Self-pay | Admitting: Orthopedic Surgery

## 2023-04-10 ENCOUNTER — Ambulatory Visit: Payer: Medicare Other | Admitting: Physical Therapy

## 2023-04-10 ENCOUNTER — Ambulatory Visit: Payer: Medicare Other | Admitting: Orthopedic Surgery

## 2023-04-10 ENCOUNTER — Encounter: Payer: Self-pay | Admitting: Physical Therapy

## 2023-04-10 DIAGNOSIS — M25572 Pain in left ankle and joints of left foot: Secondary | ICD-10-CM | POA: Diagnosis not present

## 2023-04-10 DIAGNOSIS — M6281 Muscle weakness (generalized): Secondary | ICD-10-CM

## 2023-04-10 DIAGNOSIS — R2689 Other abnormalities of gait and mobility: Secondary | ICD-10-CM

## 2023-04-10 DIAGNOSIS — R2681 Unsteadiness on feet: Secondary | ICD-10-CM

## 2023-04-10 DIAGNOSIS — S82842A Displaced bimalleolar fracture of left lower leg, initial encounter for closed fracture: Secondary | ICD-10-CM

## 2023-04-10 DIAGNOSIS — M25672 Stiffness of left ankle, not elsewhere classified: Secondary | ICD-10-CM | POA: Diagnosis not present

## 2023-04-10 DIAGNOSIS — R600 Localized edema: Secondary | ICD-10-CM

## 2023-04-10 NOTE — Progress Notes (Signed)
Office Visit Note   Patient: Holly Arellano           Date of Birth: 01/27/1956           MRN: [id number] Visit Date: 04/10/2023              Requested by: Philip Aspen, Limmie Patricia, MD [address] [place],  [place] [address] PCP: Philip Aspen, Limmie Patricia, MD  Chief Complaint  Patient presents with   Left Ankle - Routine Post Op    02/05/23 ORIF left ankle      HPI: Patient is a 67 year old woman who is 2 months status post open reduction internal fixation bimalleolar left ankle fracture.  She is ambulating with a walker and an ASO.  She is going to physical therapy upstairs.  Assessment & Plan: Visit Diagnoses:  1. Closed bimalleolar fracture of left ankle, initial encounter     Plan: Recommend she continue her compression sock continue working on range of motion exercises for the ankle.  She was provided a note to return to work on August 12.  Follow-Up Instructions: Return if symptoms worsen or fail to improve.   Ortho Exam  Patient is alert, oriented, no adenopathy, well-dressed, normal affect, normal respiratory effort. Examination patient has dorsiflexion to neutral she has decreased subtalar motion.  Incisions are well-healed.  Imaging: No results found. No images are attached to the encounter.  Labs: Lab Results  Component Value Date   HGBA1C 5.9 (A) 12/25/2022   HGBA1C 6.0 (A) 09/25/2022   HGBA1C 5.9 (A) 06/25/2022   LABORGA Normal Upper Respiratory Flora 05/18/2013   LABORGA No Beta Hemolytic Streptococci Isolated 05/18/2013     Lab Results  Component Value Date   ALBUMIN 4.2 06/25/2022   ALBUMIN 4.2 02/07/2022   ALBUMIN 4.2 07/02/2021    No results found for: "MG" Lab Results  Component Value Date   VD25OH 46.13 09/25/2022   VD25OH 27.89 (L) 06/25/2022   VD25OH 43.37 10/23/2021    No results found for: "PREALBUMIN"    Latest Ref Rng & Units 02/05/2023    9:36 AM 06/25/2022    8:27 AM 02/07/2022   11:59 PM  CBC EXTENDED  WBC  4.0 - 10.5 K/uL 5.1  3.7  6.2   RBC 3.87 - 5.11 MIL/uL 4.55  4.65  4.76   Hemoglobin 12.0 - 15.0 g/dL 84.6  96.2  95.2   HCT 36.0 - 46.0 % 39.4  39.8  41.3   Platelets 150 - 400 K/uL 195  198.0  233   NEUT# 1.4 - 7.7 K/uL  1.9  2.2   Lymph# 0.7 - 4.0 K/uL  1.4  3.4      There is no height or weight on file to calculate BMI.  Orders:  No orders of the defined types were placed in this encounter.  No orders of the defined types were placed in this encounter.    Procedures: No procedures performed  Clinical Data: No additional findings.  ROS:  All other systems negative, except as noted in the HPI. Review of Systems  Objective: Vital Signs: There were no vitals taken for this visit.  Specialty Comments:  No specialty comments available.  PMFS History: Patient Active Problem List   Diagnosis Date Noted   Closed bimalleolar fracture of left ankle 02/05/2023   Vitamin D deficiency 04/25/2020   IGT (impaired glucose tolerance) 04/25/2020   Obesity, unspecified 05/18/2014   Hyperlipemia 05/18/2014   Hyperglycemia 05/18/2014   Allergic  rhinitis 12/15/2007   GERD 12/15/2007   Past Medical History:  Diagnosis Date   Chronic maxillary sinusitis 12/15/2007   Qualifier: Diagnosis of  By: Lovell Sheehan MD, Balinda Quails    Cough variant asthma 10/19/2008   Qualifier: Diagnosis of  By: Lovell Sheehan MD, Balinda Quails    GERD (gastroesophageal reflux disease)    HEMORRHOIDS, INTERNAL 12/15/2007   Qualifier: Diagnosis of  By: Mayford Knife, [name], Domenic Polite    HIATAL HERNIA 12/15/2007   Qualifier: Diagnosis of  By: Mayford Knife, [name], [name]    Hypertension    MAMMOGRAM, ABNORMAL 08/01/2008   Qualifier: Diagnosis of  By: Mayford Knife [name], Domenic Polite    RAYNAUD'S DISEASE 12/15/2007   Qualifier: Diagnosis of  By: Mayford Knife [name], Domenic Polite     Family History  Problem Relation Age of Onset   Diabetes Mother    Hypertension Father     Past Surgical History:  Procedure Laterality Date   CHOLECYSTECTOMY     KNEE  SURGERY  1994   ORIF ANKLE FRACTURE Left 02/05/2023   Procedure: OPEN REDUCTION INTERNAL FIXATION (ORIF) LEFT ANKLE FRACTURE W/ SCREWS AND PLATES;  Surgeon: Nadara Mustard, MD;  Location: [facility] OR;  Service: Orthopedics;  Laterality: Left;   trauma to aerm in machine   1996   Social History   Occupational History   Not on file  Tobacco Use   Smoking status: Never   Smokeless tobacco: Never  Vaping Use   Vaping status: Never Used  Substance and Sexual Activity   Alcohol use: No   Drug use: No   Sexual activity: Not on file

## 2023-04-10 NOTE — Therapy (Signed)
OUTPATIENT PHYSICAL THERAPY TREATMENT  Patient Name: Dejha King MRN: [id number] DOB:Aug 31, 1956, 67 y.o., female Today's Date: 04/10/2023     END OF SESSION:  PT End of Session - 04/10/23 1108     Visit Number 9    Number of Visits 25    Date for PT Re-Evaluation 06/06/23    Authorization Type [employer]BS Medicare    Authorization Time Period $10 copay    Progress Note Due on Visit 18    PT Start Time 1106    PT Stop Time 1157    PT Time Calculation (min) 51 min    Activity Tolerance Patient tolerated treatment well;Patient limited by pain    Behavior During Therapy [facility] for tasks assessed/performed                   Past Medical History:  Diagnosis Date   Chronic maxillary sinusitis 12/15/2007   Qualifier: Diagnosis of  By: Lovell Sheehan MD, John E    Cough variant asthma 10/19/2008   Qualifier: Diagnosis of  By: Lovell Sheehan MD, John E    GERD (gastroesophageal reflux disease)    HEMORRHOIDS, INTERNAL 12/15/2007   Qualifier: Diagnosis of  By: Mayford Knife, [name], Domenic Polite    HIATAL HERNIA 12/15/2007   Qualifier: Diagnosis of  By: Mayford Knife, [name], [name]    Hypertension    MAMMOGRAM, ABNORMAL 08/01/2008   Qualifier: Diagnosis of  By: Mayford Knife [name], Domenic Polite    RAYNAUD'S DISEASE 12/15/2007   Qualifier: Diagnosis of  By: Mayford Knife [name], Domenic Polite    Past Surgical History:  Procedure Laterality Date   CHOLECYSTECTOMY     KNEE SURGERY  1994   ORIF ANKLE FRACTURE Left 02/05/2023   Procedure: OPEN REDUCTION INTERNAL FIXATION (ORIF) LEFT ANKLE FRACTURE W/ SCREWS AND PLATES;  Surgeon: Nadara Mustard, MD;  Location: [facility] OR;  Service: Orthopedics;  Laterality: Left;   trauma to aerm in machine   1996   Patient Active Problem List   Diagnosis Date Noted   Closed bimalleolar fracture of left ankle 02/05/2023   Vitamin D deficiency 04/25/2020   IGT (impaired glucose tolerance) 04/25/2020   Obesity, unspecified 05/18/2014   Hyperlipemia 05/18/2014   Hyperglycemia 05/18/2014   Allergic  rhinitis 12/15/2007   GERD 12/15/2007    PCP: Chaya Jan, MD  REFERRING PROVIDER: Barnie Del, NP  REFERRING DIAG: [phone] (ICD-10-CM) - Closed bimalleolar fracture of left ankle   THERAPY DIAG:  Stiffness of left ankle, not elsewhere classified  Muscle weakness (generalized)  Pain in left ankle and joints of left foot  Localized edema  Other abnormalities of gait and mobility  Unsteadiness on feet  Rationale for Evaluation and Treatment: Rehabilitation  ONSET DATE: 02/05/2023 surgery ORIF  SUBJECTIVE:   SUBJECTIVE STATEMENT: She saw Dr. Lajoyce Corners just prior to PT. He said to wean off ASO and continue PT.      PERTINENT HISTORY: Left ankle bimalleolar fx with ORIF 02/05/2023, asthma, HTN, Raynaud's disease, knee surgery left 1990's  PAIN:  NPRS scale: today 5/10 and since last PT 0/10 - 6-7/10 Pain location: left ankle posterior, foot forefoot & arch Pain description: throbbing Aggravating factors: standing & walking Relieving factors: tylenol, elevation & compression sock  PRECAUTIONS: Fall  WEIGHT BEARING RESTRICTIONS: Yes 03/17/2023 office note LLE WBAT in ASO  FALLS:  Has patient fallen in last 6 months? Yes. 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   Next MD visit:  04/10/2023  OBJECTIVE:  DIAGNOSTIC FINDINGS: 03/04/2023 Radiographs of the left ankle show stable alignment of fixation hardware for bimalleolar ankle fracture.  No complicating feature.   PATIENT SURVEYS:  04/10/2023  visit 9  47%  03/12/2023 / Eval: FOTO intake:  38%  predicted:   64%  COGNITION: 03/12/2023  Overall cognitive status: WFL    SENSATION: WFL  EDEMA:  03/12/2023 Circumferential:  LLE: above malleoli  26.5 cm,  around ankle heel to dorsum 36.4 cm, midfoot 27.6 cm RLE: above malleoli  22 cm,  around ankle heel to dorsum 32.1 cm, midfoot 24.6 cm  POSTURE: 03/12/2023 rounded shoulders, forward head, flexed trunk , and weight shift right  PALPATION: 03/12/2023  Tenderness medial & lateral ankle; tenderness forefoot dorsum & plantar surface  LOWER EXTREMITY ROM:   ROM Left eval Left  03/28/23 Left 04/08/23  Ankle dorsiflexion Supine  Knee ext A: -22* P: -19* Knee flex A: -15* P: -13* Supine  Knee ext A: -15* P: -8* Seated Knee ext A: -13* P: -5* Knee flexed A: -11* P: - 3*  Ankle plantarflexion Supine A: 25* P: 29* Supine A: 38* P: 45* Seated  A: 40* P: 45*  Ankle inversion Supine P: 2* Supine P: 16*   Ankle eversion Supine  P: 1* Supine P: 10*    (Blank rows = not tested)  LOWER EXTREMITY MMT:  MMT Left eval Left 04/08/23  Hip flexion    Hip extension    Hip abduction    Hip adduction    Hip internal rotation    Hip external rotation    Knee flexion    Knee extension    Ankle dorsiflexion 3-/5 3+ within available ROM  Ankle plantarflexion 3-/5 seated 3+ within available ROM  Ankle inversion 3-/5 3+ within available ROM  Ankle eversion 3-/5 3+ within available ROM   (Blank rows = not tested)  FUNCTIONAL TESTS:  04/08/2023: SLS RLE: 7.41 sec  LLE 1.33 sec   18 inch chair transfer: requires use of armrests to arise.  Needed PT cues to limit LLE weight bearing per MD office visit 04/03/23: TUG 22.8 sec, SPC   GAIT: 04/08/2023:  Pt amb >150' with cane with verbal cues only. Pt neg ramp & curb with cane with supervision.  Pt amb 25' without device except ASO with supervision.  Gait Velocity with Cane 1.83 ft/sec and with no device 1.57 ft/sec   03/12/2023: Distance walked: 100' Assistive device utilized:  Environmental consultant - 2 wheeled Level of assistance: SBA Comments: pt arrived to PT using RW & CAM boot with almost full weight bearing including taking 2 steps away from RW (no UE support)   TODAY'S TREATMENT DATE: 04/10/2023: Therapeutic Exercise: Leg Press BLEs 75# 15 reps 1 sets: LLE only 37# 10 reps 2 sets PT added the following to HEP with HO, demo & verbal cues. Pt verbalized and return demo understanding. Standing gastroc stretch 30 sec & soleus stretch 30 sec hold Standing inversion & eversion stretch with lateral & medial foot on folded towel weight bearing 30 sec hold.  Heel raises BLEs 10 reps Toe raises BLEs 10 reps Tandem stance LLE in front 30  sec with intermittent touch. LLE in back requires offset heel to toe 30 sec with intermittent touch.  Standing on foam hip width with intermittent touch: 10 reps ea eyes open & eyes closed head movements right/left, up/down & diagonals.     Therapeutic Activities: PT recommended no ASO but SHOES for mobility in home. Use ASO outside home for now and use cane for community unless going somewhere that would be a long walk. She can use shopping cart in stores and enter/exit with cane. Pt verbalized understanding.  Pt amb in clinic with cane without ASO with no note ankle instability.    TREATMENT DATE: 04/08/2023: Therapeutic Exercise: Leg Press BLEs 75# 10 reps 2 sets Squat 10 reps Tennis ball roll stretch for plantar fascia 1 min ea ant/post & med/lat Seated ankle PF red T-band 10 reps Seated ankle inversion red T-band 10 reps Seated ankle eversion red T-band 10 reps Seated ankle DF red T-band 10 reps PT added to HEP with demo, verbal & HO cues. Pt verbalized understanding.   Gait: Pt amb >150' with cane with verbal cues only. Pt neg ramp & curb with cane with supervision.   Neuromuscular Reeducation: Working on SLS - alternate LEs tapping cone 1 min, then double tap 1 min Tandem stance on floor LLE in front & in back 30 sec ea  without touching but instability noted; on foam 30 sec LLE in front & in back with intermittent touch.   04/03/23 There-ex Standing hip abduction 1x10 reps, yellow TB around lower leg 1x10 reps each side Standing hip extension x10 reps yellow TB around lower leg TUG: 22.8 sec, SPC  Neuro Re-ed NBOS on foam EO/EC 2x20 sec each NBOS on foam EO head turns Lt/Rt, up/down x10 reps   Gait training Amb 100' with SPC verbal cues to increased Rt step length, SPC placement- ModI Navigating curb with SPC and leading with the correct LE and SPC placement, CGA and one near LOB stepping down from curb. Reviewed side stepping down the curb to improve stability for now Up/down ramp x2 with SPC, PT cuing for [facility] placement, CGA  Vaso end of session 34deg, medium compression, x10 min      PATIENT EDUCATION:  Education details: gait, HEP, POC Person educated: Patient Education method: Programmer, multimedia, Demonstration, Verbal cues, and Handouts Education comprehension: verbalized understanding, returned demonstration, and verbal cues required  HOME EXERCISE PROGRAM: Access Code: [email or web address] URL: [email or web address] Date: 04/10/2023 Prepared by: Vladimir Faster  Exercises - Seated Calf Stretch with Strap  - 3 x daily - 7 x weekly - 1 sets - 3-5 reps - 30 seconds hold - Seated Soleus Stretch with Strap  - 3 x daily - 7 x weekly - 1 sets - 3-5 reps - 30 seconds hold - standing calf stretch with forefoot on small step or brick  - 2 x daily - 7 x weekly - 1 sets - 3 reps - 30 seconds hold - Standing Soleus Stretch on Foam 1/2 Roll  - 2 x daily - 7 x weekly - 1 sets - 3 reps - 30 seconds hold - Seated Plantar Fascia Mobilization with Small Ball  - 1 x daily - 5 x weekly - 1 sets - 10 reps - 5 seconds hold - Seated Ankle Plantarflexion with Resistance  - 1 x daily - 7 x weekly - 2 sets - 10 reps - 5 seconds hold - Seated Ankle Inversion with Anchored Resistance  - 1 x daily - 7 x weekly -  2 sets -  10 reps - 5 seconds hold - Seated Ankle Eversion with Anchored Resistance  - 1 x daily - 7 x weekly - 2 sets - 10 reps - 5 seconds hold - Long Sitting Ankle Dorsiflexion with Anchored Resistance  - 1 x daily - 7 x weekly - 2 sets - 10 reps - 5 seconds hold - Standing Tandem Balance with Counter Support  - 1 x daily - 7 x weekly - 1 sets - 2 reps - 30 seconds hold - Standing Gastroc Stretch at Counter  - 2 x daily - 7 x weekly - 1 sets - 3 reps - 30 seconds hold - Standing Soleus Stretch  - 2 x daily - 7 x weekly - 1 sets - 3 reps - 30 seconds hold - Heel raises near counter  - 2 x daily - 7 x weekly - 2 sets - 10 reps - 5 seconds hold - Standing Toe Raises at Chair  - 2 x daily - 7 x weekly - 2 sets - 10 reps - 5 seconds hold - Tandem Stance  - 1 x daily - 7 x weekly - 1 sets - 2 reps - 30 seconds hold - Wide stance on Foam Pad head movements  - 1 x daily - 7 x weekly - 2 sets - 10 reps Hand written  -inversion & eversion stretch standing with lateral & medial foot on towel - 30 sec 3 reps 2x/day  ASSESSMENT: CLINICAL IMPRESSION: Patient appears to understand updated HEP.  She appears safe to function in home environment (level surfaces limited distances) without ASO with cane.   OBJECTIVE IMPAIRMENTS: Abnormal gait, decreased activity tolerance, decreased balance, decreased endurance, decreased knowledge of condition, decreased knowledge of use of DME, decreased mobility, difficulty walking, decreased ROM, decreased strength, increased edema, impaired flexibility, postural dysfunction, obesity, and pain.   ACTIVITY LIMITATIONS: carrying, lifting, bending, standing, squatting, sleeping, stairs, transfers, and locomotion level  PARTICIPATION LIMITATIONS: meal prep, cleaning, laundry, driving, community activity, and occupation  PERSONAL FACTORS: Age, Fitness, Time since onset of injury/illness/exacerbation, and 3+ comorbidities: see PMH  are also affecting patient's functional outcome.    REHAB POTENTIAL: Good  CLINICAL DECISION MAKING: Stable/uncomplicated  EVALUATION COMPLEXITY: Low   GOALS: Goals reviewed with patient? Yes  SHORT TERM GOALS: (target date for Short term goals 04/11/2023)   1.  Patient will demonstrate independent use of home exercise program to maintain progress from in clinic treatments. Baseline: See objective data Goal status: MET  04/10/2023  2. PROM left ankle DF 0* Baseline: See objective data Goal status: Ongoing 04/10/2023  3. Interim FOTO >45% Baseline: See objective data Goal status: MET  04/10/2023   LONG TERM GOALS: (target dates for all long term goals  06/06/2023 )   1. Patient will demonstrate/report pain at worst less than or equal to 2/10 to facilitate minimal limitation in daily activity secondary to pain symptoms. Baseline: See Objective data Goal status: Ongoing 04/01/2023   2. Patient will demonstrate independent use of home exercise program to facilitate ability to maintain/progress functional gains from skilled physical therapy services. Baseline: See Objective data Goal status: Ongoing 04/01/2023   3. Patient will demonstrate FOTO outcome > or = 64 % to indicate reduced disability due to condition. Baseline: See Objective data Goal status: Ongoing 04/01/2023   4.  Patient will demonstrate left ankle MMT >/= 4/5 to faciltiate usual transfers, stairs, squatting at [facility] for daily life.  Baseline: See Objective data Goal status: Ongoing  04/01/2023   5.  Left ankle AROM DF >15* and PF >45* Baseline: See Objective data Goal status: Ongoing 04/01/2023   6.  patient ambulates without assistive device >1000' and negotiates ramps, curbs & stairs single rail independently.  Baseline: See Objective data Goal status: Ongoing 04/01/2023    PLAN:  PT FREQUENCY:  2x/week  PT DURATION: 12 weeks  PLANNED INTERVENTIONS: Therapeutic exercises, Therapeutic activity, Neuro Muscular re-education, Balance training, Gait training,  Patient/Family education, Joint mobilization, Stair training, DME instructions, Dry Needling, Electrical stimulation, Traction, Cryotherapy, vasopneumatic deviceMoist heat, Taping, Ultrasound, Ionotophoresis 4mg /ml Dexamethasone, and aquatic therapy, Manual therapy.  All included unless contraindicated  PLAN FOR NEXT SESSION:  check updated HEP,  continue gait training with SPC instructing in ramp & curb,  continue to progress standing exercises and balance activities.  manual therapy for range, consider vaso to end session. WBAT LLE in ASO   Vladimir Faster, PT, DPT 04/10/2023, 12:55 PM

## 2023-04-14 ENCOUNTER — Ambulatory Visit: Payer: Medicare Other | Admitting: Physical Therapy

## 2023-04-14 ENCOUNTER — Encounter: Payer: Self-pay | Admitting: Physical Therapy

## 2023-04-14 DIAGNOSIS — M6281 Muscle weakness (generalized): Secondary | ICD-10-CM | POA: Diagnosis not present

## 2023-04-14 DIAGNOSIS — R600 Localized edema: Secondary | ICD-10-CM | POA: Diagnosis not present

## 2023-04-14 DIAGNOSIS — M25672 Stiffness of left ankle, not elsewhere classified: Secondary | ICD-10-CM

## 2023-04-14 DIAGNOSIS — M25572 Pain in left ankle and joints of left foot: Secondary | ICD-10-CM

## 2023-04-14 DIAGNOSIS — R2681 Unsteadiness on feet: Secondary | ICD-10-CM

## 2023-04-14 DIAGNOSIS — R2689 Other abnormalities of gait and mobility: Secondary | ICD-10-CM

## 2023-04-14 NOTE — Therapy (Signed)
OUTPATIENT PHYSICAL THERAPY TREATMENT  Patient Name: Holly Arellano MRN: [id number] DOB:08/22/1956, 67 y.o., female Today's Date: 04/14/2023     END OF SESSION:  PT End of Session - 04/14/23 1147     Visit Number 10    Number of Visits 25    Date for PT Re-Evaluation 06/06/23    Authorization Type [employer] Medicare    Authorization Time Period $10 copay    Progress Note Due on Visit 18    PT Start Time 1145    PT Stop Time 1229    PT Time Calculation (min) 44 min    Activity Tolerance Patient tolerated treatment well;Patient limited by pain    Behavior During Therapy [facility] for tasks assessed/performed                    Past Medical History:  Diagnosis Date   Chronic maxillary sinusitis 12/15/2007   Qualifier: Diagnosis of  By: Lovell Sheehan MD, John E    Cough variant asthma 10/19/2008   Qualifier: Diagnosis of  By: Lovell Sheehan MD, John E    GERD (gastroesophageal reflux disease)    HEMORRHOIDS, INTERNAL 12/15/2007   Qualifier: Diagnosis of  By: Mayford Knife, [name], Domenic Polite    HIATAL HERNIA 12/15/2007   Qualifier: Diagnosis of  By: Mayford Knife, [name], [name]    Hypertension    MAMMOGRAM, ABNORMAL 08/01/2008   Qualifier: Diagnosis of  By: Mayford Knife [name], Domenic Polite    RAYNAUD'S DISEASE 12/15/2007   Qualifier: Diagnosis of  By: Mayford Knife [name], Domenic Polite    Past Surgical History:  Procedure Laterality Date   CHOLECYSTECTOMY     KNEE SURGERY  1994   ORIF ANKLE FRACTURE Left 02/05/2023   Procedure: OPEN REDUCTION INTERNAL FIXATION (ORIF) LEFT ANKLE FRACTURE W/ SCREWS AND PLATES;  Surgeon: Nadara Mustard, MD;  Location: [facility] OR;  Service: Orthopedics;  Laterality: Left;   trauma to aerm in machine   1996   Patient Active Problem List   Diagnosis Date Noted   Closed bimalleolar fracture of left ankle 02/05/2023   Vitamin D deficiency 04/25/2020   IGT (impaired glucose tolerance) 04/25/2020   Obesity, unspecified 05/18/2014   Hyperlipemia 05/18/2014   Hyperglycemia 05/18/2014   Allergic  rhinitis 12/15/2007   GERD 12/15/2007    PCP: Chaya Jan, MD  REFERRING PROVIDER: Barnie Del, NP  REFERRING DIAG: [phone] (ICD-10-CM) - Closed bimalleolar fracture of left ankle   THERAPY DIAG:  Stiffness of left ankle, not elsewhere classified  Muscle weakness (generalized)  Pain in left ankle and joints of left foot  Localized edema  Other abnormalities of gait and mobility  Unsteadiness on feet  Rationale for Evaluation and Treatment: Rehabilitation  ONSET DATE: 02/05/2023 surgery ORIF  SUBJECTIVE:   SUBJECTIVE STATEMENT: No change in pain in ankle from updated PT session last visit.    PERTINENT HISTORY: Left ankle bimalleolar fx with ORIF 02/05/2023, asthma, HTN, Raynaud's disease, knee surgery left 1990's  PAIN:  NPRS scale: today 1/10 and since last PT 1/10 - 5/10 Pain location: left ankle posterior, foot forefoot & arch Pain description: throbbing Aggravating factors: standing & walking Relieving factors: tylenol, elevation & compression sock  PRECAUTIONS: Fall  WEIGHT BEARING RESTRICTIONS: Yes 03/17/2023 office note LLE WBAT in ASO  FALLS:  Has patient fallen in last 6 months? Yes. 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   Next MD visit:  04/10/2023  OBJECTIVE:  DIAGNOSTIC FINDINGS: 03/04/2023 Radiographs of the left ankle show stable alignment of fixation hardware for bimalleolar ankle fracture.  No complicating feature.   PATIENT SURVEYS:  04/10/2023  visit 9  47%  03/12/2023 / Eval: FOTO intake:  38%  predicted:   64%  COGNITION: 03/12/2023  Overall cognitive status: WFL    SENSATION: WFL  EDEMA:  03/12/2023 Circumferential:  LLE: above malleoli  26.5 cm,  around ankle heel to dorsum 36.4 cm, midfoot 27.6 cm RLE: above malleoli  22 cm,  around ankle heel to dorsum 32.1 cm, midfoot 24.6 cm  POSTURE: 03/12/2023 rounded shoulders, forward head, flexed trunk , and weight shift right  PALPATION: 03/12/2023  Tenderness medial & lateral ankle; tenderness forefoot dorsum & plantar surface  LOWER EXTREMITY ROM:   ROM Left eval Left  03/28/23 Left 04/08/23  Ankle dorsiflexion Supine  Knee ext A: -22* P: -19* Knee flex A: -15* P: -13* Supine  Knee ext A: -15* P: -8* Seated Knee ext A: -13* P: -5* Knee flexed A: -11* P: - 3*  Ankle plantarflexion Supine A: 25* P: 29* Supine A: 38* P: 45* Seated  A: 40* P: 45*  Ankle inversion Supine P: 2* Supine P: 16*   Ankle eversion Supine  P: 1* Supine P: 10*    (Blank rows = not tested)  LOWER EXTREMITY MMT:  MMT Left eval Left 04/08/23  Hip flexion    Hip extension    Hip abduction    Hip adduction    Hip internal rotation    Hip external rotation    Knee flexion    Knee extension    Ankle dorsiflexion 3-/5 3+ within available ROM  Ankle plantarflexion 3-/5 seated 3+ within available ROM  Ankle inversion 3-/5 3+ within available ROM  Ankle eversion 3-/5 3+ within available ROM   (Blank rows = not tested)  FUNCTIONAL TESTS:  04/08/2023: SLS RLE: 7.41 sec  LLE 1.33 sec   18 inch chair transfer: requires use of armrests to arise.  Needed PT cues to limit LLE weight bearing per MD office visit 04/03/23: TUG 22.8 sec, SPC   GAIT: 04/08/2023:  Pt amb >150' with cane with verbal cues only. Pt neg ramp & curb with cane with supervision.  Pt amb 25' without device except ASO with supervision.  Gait Velocity with Cane 1.83 ft/sec and with no device 1.57 ft/sec   03/12/2023: Distance walked: 100' Assistive device utilized:  Environmental consultant - 2 wheeled Level of assistance: SBA Comments: pt arrived to PT using RW & CAM boot with almost full weight bearing including taking 2 steps away from RW (no UE support)   TODAY'S TREATMENT DATE: 04/14/2023: Therapeutic Exercise: Leg Press BLEs 81# 10 reps 2 sets: LLE only 43# 10 reps 2 sets BAPS board LLE level 2 BUE support circles CW & CCW 5 reps 2 sets with PT manual assist for motion Gastroc & Soleus stretch on step heel depression 30 sec 2 reps  Neuromuscular Re-education: Tandem gait forward / backward LUE support  Toe walking forward BUE support 8' 3 reps Backwards walking without UE support 8' 3 reps Braiding BUE support //bars 8' 3 reps Standing on foam hip  width apart: eyes open 10 reps then eyes closed 10 reps head motions right/left, up/down and diagonals.   Therapeutic Activities: Pt amb in clinic without device and without ASO safely. PT recommended ambulation in house without ASO or cane and in community with ASO & cane. Pt verbalized understanding.   Walking up / down ramp 3 reps ea: forward facing, side step to right and side step to left.  Stairs with 2 rails 11 steps: descend step-to using LLE and ascend alternating pattern   04/10/2023: Therapeutic Exercise: Leg Press BLEs 75# 15 reps 1 sets: LLE only 37# 10 reps 2 sets PT added the following to HEP with HO, demo & verbal cues. Pt verbalized and return demo understanding. Standing gastroc stretch 30 sec & soleus stretch 30 sec hold Standing inversion & eversion stretch with lateral & medial foot on folded towel weight bearing 30 sec hold.  Heel raises BLEs 10 reps Toe raises BLEs 10 reps Tandem stance LLE in front 30 sec with intermittent touch. LLE in back requires offset heel to toe 30 sec with intermittent touch.  Standing on foam hip width with intermittent touch: 10 reps ea eyes open & eyes closed head movements right/left, up/down & diagonals.     Therapeutic Activities: PT recommended no ASO  but SHOES for mobility in home. Use ASO outside home for now and use cane for community unless going somewhere that would be a long walk. She can use shopping cart in stores and enter/exit with cane. Pt verbalized understanding.  Pt amb in clinic with cane without ASO with no note ankle instability.    TREATMENT DATE: 04/08/2023: Therapeutic Exercise: Leg Press BLEs 75# 10 reps 2 sets Squat 10 reps Tennis ball roll stretch for plantar fascia 1 min ea ant/post & med/lat Seated ankle PF red T-band 10 reps Seated ankle inversion red T-band 10 reps Seated ankle eversion red T-band 10 reps Seated ankle DF red T-band 10 reps PT added to HEP with demo, verbal & HO cues. Pt verbalized understanding.   Gait: Pt amb >150' with cane with verbal cues only. Pt neg ramp & curb with cane with supervision.   Neuromuscular Reeducation: Working on SLS - alternate LEs tapping cone 1 min, then double tap 1 min Tandem stance on floor LLE in front & in back 30 sec ea without touching but instability noted; on foam 30 sec LLE in front & in back with intermittent touch.     PATIENT EDUCATION:  Education details: gait, HEP, POC Person educated: Patient Education method: Explanation, Demonstration, Verbal cues, and Handouts Education comprehension: verbalized understanding, returned demonstration, and verbal cues required  HOME EXERCISE PROGRAM: Access Code: [email or web address] URL: [email or web address] Date: 04/10/2023 Prepared by: Vladimir Faster  Exercises - Seated Calf Stretch with Strap  - 3 x daily - 7 x weekly - 1 sets - 3-5 reps - 30 seconds hold - Seated Soleus Stretch with Strap  - 3 x daily - 7 x weekly - 1 sets - 3-5 reps - 30 seconds hold - standing calf stretch with forefoot on small step or brick  - 2 x daily - 7 x weekly - 1 sets - 3 reps - 30 seconds hold - Standing Soleus Stretch on Foam 1/2 Roll  - 2 x daily - 7 x weekly - 1 sets - 3 reps - 30 seconds hold - Seated Plantar  Fascia Mobilization with Small Ball  - 1 x daily - 5 x weekly - 1 sets - 10 reps -  5 seconds hold - Seated Ankle Plantarflexion with Resistance  - 1 x daily - 7 x weekly - 2 sets - 10 reps - 5 seconds hold - Seated Ankle Inversion with Anchored Resistance  - 1 x daily - 7 x weekly - 2 sets - 10 reps - 5 seconds hold - Seated Ankle Eversion with Anchored Resistance  - 1 x daily - 7 x weekly - 2 sets - 10 reps - 5 seconds hold - Long Sitting Ankle Dorsiflexion with Anchored Resistance  - 1 x daily - 7 x weekly - 2 sets - 10 reps - 5 seconds hold - Standing Tandem Balance with Counter Support  - 1 x daily - 7 x weekly - 1 sets - 2 reps - 30 seconds hold - Standing Gastroc Stretch at Counter  - 2 x daily - 7 x weekly - 1 sets - 3 reps - 30 seconds hold - Standing Soleus Stretch  - 2 x daily - 7 x weekly - 1 sets - 3 reps - 30 seconds hold - Heel raises near counter  - 2 x daily - 7 x weekly - 2 sets - 10 reps - 5 seconds hold - Standing Toe Raises at Chair  - 2 x daily - 7 x weekly - 2 sets - 10 reps - 5 seconds hold - Tandem Stance  - 1 x daily - 7 x weekly - 1 sets - 2 reps - 30 seconds hold - Wide stance on Foam Pad head movements  - 1 x daily - 7 x weekly - 2 sets - 10 reps Hand written  -inversion & eversion stretch standing with lateral & medial foot on towel - 30 sec 3 reps 2x/day  ASSESSMENT: CLINICAL IMPRESSION: Patient appears safe for limited distance with minor chance to roll ankle like household ambulating without ASO or cane.  PT progressed standing activities that required ankle motion & strength which she tolerated well.    OBJECTIVE IMPAIRMENTS: Abnormal gait, decreased activity tolerance, decreased balance, decreased endurance, decreased knowledge of condition, decreased knowledge of use of DME, decreased mobility, difficulty walking, decreased ROM, decreased strength, increased edema, impaired flexibility, postural dysfunction, obesity, and pain.   ACTIVITY LIMITATIONS: carrying,  lifting, bending, standing, squatting, sleeping, stairs, transfers, and locomotion level  PARTICIPATION LIMITATIONS: meal prep, cleaning, laundry, driving, community activity, and occupation  PERSONAL FACTORS: Age, Fitness, Time since onset of injury/illness/exacerbation, and 3+ comorbidities: see PMH  are also affecting patient's functional outcome.   REHAB POTENTIAL: Good  CLINICAL DECISION MAKING: Stable/uncomplicated  EVALUATION COMPLEXITY: Low   GOALS: Goals reviewed with patient? Yes  SHORT TERM GOALS: (target date for Short term goals 04/11/2023)   1.  Patient will demonstrate independent use of home exercise program to maintain progress from in clinic treatments. Baseline: See objective data Goal status: MET  04/10/2023  2. PROM left ankle DF 0* Baseline: See objective data Goal status: Ongoing 04/10/2023  3. Interim FOTO >45% Baseline: See objective data Goal status: MET  04/10/2023   LONG TERM GOALS: (target dates for all long term goals  06/06/2023 )   1. Patient will demonstrate/report pain at worst less than or equal to 2/10 to facilitate minimal limitation in daily activity secondary to pain symptoms. Baseline: See Objective data Goal status: Ongoing 04/01/2023   2. Patient will demonstrate independent use of home exercise program to facilitate ability to maintain/progress functional gains from skilled physical therapy services. Baseline: See Objective data Goal status: Ongoing 04/01/2023   3.  Patient will demonstrate FOTO outcome > or = 64 % to indicate reduced disability due to condition. Baseline: See Objective data Goal status: Ongoing 04/01/2023   4.  Patient will demonstrate left ankle MMT >/= 4/5 to faciltiate usual transfers, stairs, squatting at [facility] for daily life.  Baseline: See Objective data Goal status: Ongoing 04/01/2023   5.  Left ankle AROM DF >15* and PF >45* Baseline: See Objective data Goal status: Ongoing 04/01/2023   6.  patient ambulates  without assistive device >1000' and negotiates ramps, curbs & stairs single rail independently.  Baseline: See Objective data Goal status: Ongoing 04/01/2023    PLAN:  PT FREQUENCY:  2x/week  PT DURATION: 12 weeks  PLANNED INTERVENTIONS: Therapeutic exercises, Therapeutic activity, Neuro Muscular re-education, Balance training, Gait training, Patient/Family education, Joint mobilization, Stair training, DME instructions, Dry Needling, Electrical stimulation, Traction, Cryotherapy, vasopneumatic deviceMoist heat, Taping, Ultrasound, Ionotophoresis 4mg /ml Dexamethasone, and aquatic therapy, Manual therapy.  All included unless contraindicated  PLAN FOR NEXT SESSION:  continue gait training without device or ASO including stairs, ramp & curb,  continue to progress standing exercises and balance activities.  manual therapy for range, consider vaso to end session.   Vladimir Faster, PT, DPT 04/14/2023, 12:42 PM

## 2023-04-16 ENCOUNTER — Ambulatory Visit: Payer: Medicare Other | Admitting: Physical Therapy

## 2023-04-16 ENCOUNTER — Encounter: Payer: Self-pay | Admitting: Physical Therapy

## 2023-04-16 DIAGNOSIS — M25672 Stiffness of left ankle, not elsewhere classified: Secondary | ICD-10-CM

## 2023-04-16 DIAGNOSIS — R600 Localized edema: Secondary | ICD-10-CM

## 2023-04-16 DIAGNOSIS — M6281 Muscle weakness (generalized): Secondary | ICD-10-CM | POA: Diagnosis not present

## 2023-04-16 DIAGNOSIS — M25572 Pain in left ankle and joints of left foot: Secondary | ICD-10-CM | POA: Diagnosis not present

## 2023-04-16 DIAGNOSIS — R2689 Other abnormalities of gait and mobility: Secondary | ICD-10-CM

## 2023-04-16 DIAGNOSIS — R2681 Unsteadiness on feet: Secondary | ICD-10-CM

## 2023-04-16 NOTE — Therapy (Signed)
OUTPATIENT PHYSICAL THERAPY TREATMENT  Patient Name: Holly Arellano MRN: [id number] DOB:[birth year], 67 y.o., female Today's Date: 04/16/2023     END OF SESSION:  PT End of Session - 04/16/23 1104     Visit Number 11    Number of Visits 25    Date for PT Re-Evaluation 06/06/23    Authorization Type BCBS Medicare    Authorization Time Period $10 copay    Progress Note Due on Visit 18    PT Start Time 1100    PT Stop Time 1139    PT Time Calculation (min) 39 min    Activity Tolerance Patient tolerated treatment well;Patient limited by pain    Behavior During Therapy [facility] for tasks assessed/performed                     Past Medical History:  Diagnosis Date   Chronic maxillary sinusitis 12/15/2007   Qualifier: Diagnosis of  By: Lovell Sheehan MD, John E    Cough variant asthma 10/19/2008   Qualifier: Diagnosis of  By: Lovell Sheehan MD, John E    GERD (gastroesophageal reflux disease)    HEMORRHOIDS, INTERNAL 12/15/2007   Qualifier: Diagnosis of  By: Mayford Knife, [name], Domenic Polite    HIATAL HERNIA 12/15/2007   Qualifier: Diagnosis of  By: Mayford Knife, [name], [name]    Hypertension    MAMMOGRAM, ABNORMAL 08/01/2008   Qualifier: Diagnosis of  By: Mayford Knife [name], Domenic Polite    RAYNAUD'S DISEASE 12/15/2007   Qualifier: Diagnosis of  By: Mayford Knife [name], Domenic Polite    Past Surgical History:  Procedure Laterality Date   CHOLECYSTECTOMY     KNEE SURGERY  1994   ORIF ANKLE FRACTURE Left 02/05/2023   Procedure: OPEN REDUCTION INTERNAL FIXATION (ORIF) LEFT ANKLE FRACTURE W/ SCREWS AND PLATES;  Surgeon: Nadara Mustard, MD;  Location: [facility] OR;  Service: Orthopedics;  Laterality: Left;   trauma to aerm in machine   1996   Patient Active Problem List   Diagnosis Date Noted   Closed bimalleolar fracture of left ankle 02/05/2023   Vitamin D deficiency 04/25/2020   IGT (impaired glucose tolerance) 04/25/2020   Obesity, unspecified 05/18/2014   Hyperlipemia 05/18/2014   Hyperglycemia 05/18/2014   Allergic  rhinitis 12/15/2007   GERD 12/15/2007    PCP: Chaya Jan, MD  REFERRING PROVIDER: Barnie Del, NP  REFERRING DIAG: [phone] (ICD-10-CM) - Closed bimalleolar fracture of left ankle   THERAPY DIAG:  Stiffness of left ankle, not elsewhere classified  Muscle weakness (generalized)  Pain in left ankle and joints of left foot  Localized edema  Unsteadiness on feet  Other abnormalities of gait and mobility  Rationale for Evaluation and Treatment: Rehabilitation  ONSET DATE: 02/05/2023 surgery ORIF  SUBJECTIVE:   SUBJECTIVE STATEMENT: Has no pain today   PERTINENT HISTORY: Left ankle bimalleolar fx with ORIF 02/05/2023, asthma, HTN, Raynaud's disease, knee surgery left 1990's  PAIN:  NPRS scale: today 0/10 and since last PT 1/10 - 5/10 Pain location: left ankle posterior, foot forefoot & arch Pain description: throbbing Aggravating factors: standing & walking Relieving factors: tylenol, elevation & compression sock  PRECAUTIONS: Fall  WEIGHT BEARING RESTRICTIONS: Yes 03/17/2023 office note LLE WBAT in ASO  FALLS:  Has patient fallen in last 6 months? Yes. 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   Next MD visit:  04/10/2023  OBJECTIVE:  DIAGNOSTIC FINDINGS: 03/04/2023 Radiographs of the left ankle show stable alignment of fixation hardware for bimalleolar ankle fracture.  No complicating feature.   PATIENT SURVEYS:  04/10/2023  visit 9  47%  03/12/2023 / Eval: FOTO intake:  38%  predicted:  64%  COGNITION: 03/12/2023  Overall cognitive status:  WFL    SENSATION: WFL  EDEMA:  03/12/2023 Circumferential:  LLE: above malleoli  26.5 cm,  around ankle heel to dorsum 36.4 cm, midfoot 27.6 cm RLE: above malleoli  22 cm,  around ankle heel to dorsum 32.1 cm, midfoot 24.6 cm  POSTURE: 03/12/2023 rounded shoulders, forward head, flexed trunk , and weight shift right  PALPATION: 03/12/2023  Tenderness medial & lateral ankle; tenderness forefoot dorsum & plantar surface  LOWER EXTREMITY ROM:   ROM Left eval Left  03/28/23 Left 04/08/23  Ankle dorsiflexion Supine  Knee ext A: -22* P: -19* Knee flex A: -15* P: -13* Supine  Knee ext A: -15* P: -8* Seated Knee ext A: -13* P: -5* Knee flexed A: -11* P: - 3*  Ankle plantarflexion Supine A: 25* P: 29* Supine A: 38* P: 45* Seated  A: 40* P: 45*  Ankle inversion Supine P: 2* Supine P: 16*   Ankle eversion Supine  P: 1* Supine P: 10*    (Blank rows = not tested)  LOWER EXTREMITY MMT:  MMT Left eval Left 04/08/23  Ankle dorsiflexion 3-/5 3+ within available ROM  Ankle plantarflexion 3-/5 seated 3+ within available ROM  Ankle inversion 3-/5 3+ within available ROM  Ankle eversion 3-/5 3+ within available ROM   (Blank rows = not tested)  FUNCTIONAL TESTS:  04/08/2023: SLS RLE: 7.41 sec  LLE 1.33 sec   18 inch chair transfer: requires use of armrests to arise.  Needed PT cues to limit LLE weight bearing per MD office visit 04/03/23: TUG 22.8 sec, SPC   GAIT: 04/08/2023:  Pt amb >150' with cane with verbal cues only. Pt neg ramp & curb with cane with supervision.  Pt amb 25' without device except ASO with supervision.  Gait Velocity with Cane 1.83 ft/sec and with no device 1.57 ft/sec   03/12/2023: Distance walked: 100' Assistive device utilized: Environmental consultant - 2 wheeled Level of assistance: SBA Comments: pt arrived to PT using RW & CAM boot with almost full weight bearing including taking 2 steps away from RW (no UE support)   TODAY'S  TREATMENT DATE: 04/16/23 Therapeutic Exercise: Leg Press BLEs 87# 10 reps 2 sets: LLE only 43# 10 reps 2 sets Incline board stretch 3x30 sec; Lt soleus stretch on incline board 3x30 sec Heel raises x20 reps; light touch for balance Standing hip abduction 2x10; L3 band with light UE support needed Standing hip extension 2x10; L3 band with light UE support needed   Neuromuscular Re-education: Tandem gait forward / backward LUE support x 5 laps in // bars Sidestepping on foam x 5 laps in // bars; intermittent UE support Rockerboard ant/post and lateral x 3 min each   04/14/2023: Therapeutic Exercise: Leg Press BLEs 81# 10 reps 2 sets: LLE only 43# 10 reps 2 sets BAPS board LLE level 2 BUE support circles CW & CCW 5 reps 2 sets with PT manual assist for  motion Gastroc & Soleus stretch on step heel depression 30 sec 2 reps  Neuromuscular Re-education: Tandem gait forward / backward LUE support  Toe walking forward BUE support 8' 3 reps Backwards walking without UE support 8' 3 reps Braiding BUE support //bars 8' 3 reps Standing on foam hip width apart: eyes open 10 reps then eyes closed 10 reps head motions right/left, up/down and diagonals.   Therapeutic Activities: Pt amb in clinic without device and without ASO safely. PT recommended ambulation in house without ASO or cane and in community with ASO & cane. Pt verbalized understanding.   Walking up / down ramp 3 reps ea: forward facing, side step to right and side step to left.  Stairs with 2 rails 11 steps: descend step-to using LLE and ascend alternating pattern   04/10/2023: Therapeutic Exercise: Leg Press BLEs 75# 15 reps 1 sets: LLE only 37# 10 reps 2 sets PT added the following to HEP with HO, demo & verbal cues. Pt verbalized and return demo understanding. Standing gastroc stretch 30 sec & soleus stretch 30 sec hold Standing inversion & eversion stretch with lateral & medial foot on folded towel weight bearing 30 sec hold.   Heel raises BLEs 10 reps Toe raises BLEs 10 reps Tandem stance LLE in front 30 sec with intermittent touch. LLE in back requires offset heel to toe 30 sec with intermittent touch.  Standing on foam hip width with intermittent touch: 10 reps ea eyes open & eyes closed head movements right/left, up/down & diagonals.     Therapeutic Activities: PT recommended no ASO but SHOES for mobility in home. Use ASO outside home for now and use cane for community unless going somewhere that would be a long walk. She can use shopping cart in stores and enter/exit with cane. Pt verbalized understanding.  Pt amb in clinic with cane without ASO with no note ankle instability.       PATIENT EDUCATION:  Education details: gait, HEP, POC Person educated: Patient Education method: Explanation, Demonstration, Verbal cues, and Handouts Education comprehension: verbalized understanding, returned demonstration, and verbal cues required  HOME EXERCISE PROGRAM: Access Code: [email or web address] URL: [email or web address] Date: 04/10/2023 Prepared by: Vladimir Faster  Exercises - Seated Calf Stretch with Strap  - 3 x daily - 7 x weekly - 1 sets - 3-5 reps - 30 seconds hold - Seated Soleus Stretch with Strap  - 3 x daily - 7 x weekly - 1 sets - 3-5 reps - 30 seconds hold - standing calf stretch with forefoot on small step or brick  - 2 x daily - 7 x weekly - 1 sets - 3 reps - 30 seconds hold - Standing Soleus Stretch on Foam 1/2 Roll  - 2 x daily - 7 x weekly - 1 sets - 3 reps - 30 seconds hold - Seated Plantar Fascia Mobilization with Small Ball  - 1 x daily - 5 x weekly - 1 sets - 10 reps - 5 seconds hold - Seated Ankle Plantarflexion with Resistance  - 1 x daily - 7 x weekly - 2 sets - 10 reps - 5 seconds hold - Seated Ankle Inversion with Anchored Resistance  - 1 x daily - 7 x weekly - 2 sets - 10 reps - 5 seconds hold - Seated Ankle Eversion with Anchored Resistance  - 1 x daily - 7 x weekly - 2 sets -  10 reps - 5 seconds hold - Long Sitting Ankle Dorsiflexion with Anchored Resistance  -  1 x daily - 7 x weekly - 2 sets - 10 reps - 5 seconds hold - Standing Tandem Balance with Counter Support  - 1 x daily - 7 x weekly - 1 sets - 2 reps - 30 seconds hold - Standing Gastroc Stretch at Counter  - 2 x daily - 7 x weekly - 1 sets - 3 reps - 30 seconds hold - Standing Soleus Stretch  - 2 x daily - 7 x weekly - 1 sets - 3 reps - 30 seconds hold - Heel raises near counter  - 2 x daily - 7 x weekly - 2 sets - 10 reps - 5 seconds hold - Standing Toe Raises at Chair  - 2 x daily - 7 x weekly - 2 sets - 10 reps - 5 seconds hold - Tandem Stance  - 1 x daily - 7 x weekly - 1 sets - 2 reps - 30 seconds hold - Wide stance on Foam Pad head movements  - 1 x daily - 7 x weekly - 2 sets - 10 reps Hand written  -inversion & eversion stretch standing with lateral & medial foot on towel - 30 sec 3 reps 2x/day  ASSESSMENT: CLINICAL IMPRESSION: Pt tolerated session well today with expected fatigue in ankle with standing exercises.  Progressing well with PT.  Patient appears safe for limited distance with minor chance to roll ankle like household ambulating without ASO or cane.  PT progressed standing activities that required ankle motion & strength which she tolerated well.    OBJECTIVE IMPAIRMENTS: Abnormal gait, decreased activity tolerance, decreased balance, decreased endurance, decreased knowledge of condition, decreased knowledge of use of DME, decreased mobility, difficulty walking, decreased ROM, decreased strength, increased edema, impaired flexibility, postural dysfunction, obesity, and pain.   ACTIVITY LIMITATIONS: carrying, lifting, bending, standing, squatting, sleeping, stairs, transfers, and locomotion level  PARTICIPATION LIMITATIONS: meal prep, cleaning, laundry, driving, community activity, and occupation  PERSONAL FACTORS: Age, Fitness, Time since onset of injury/illness/exacerbation, and 3+  comorbidities: see PMH  are also affecting patient's functional outcome.   REHAB POTENTIAL: Good  CLINICAL DECISION MAKING: Stable/uncomplicated  EVALUATION COMPLEXITY: Low   GOALS: Goals reviewed with patient? Yes  SHORT TERM GOALS: (target date for Short term goals 04/11/2023)   1.  Patient will demonstrate independent use of home exercise program to maintain progress from in clinic treatments. Baseline: See objective data Goal status: MET  04/10/2023  2. PROM left ankle DF 0* Baseline: See objective data Goal status: Ongoing 04/10/2023  3. Interim FOTO >45% Baseline: See objective data Goal status: MET  04/10/2023   LONG TERM GOALS: (target dates for all long term goals  06/06/2023 )   1. Patient will demonstrate/report pain at worst less than or equal to 2/10 to facilitate minimal limitation in daily activity secondary to pain symptoms. Baseline: See Objective data Goal status: Ongoing 04/01/2023   2. Patient will demonstrate independent use of home exercise program to facilitate ability to maintain/progress functional gains from skilled physical therapy services. Baseline: See Objective data Goal status: Ongoing 04/01/2023   3. Patient will demonstrate FOTO outcome > or = 64 % to indicate reduced disability due to condition. Baseline: See Objective data Goal status: Ongoing 04/01/2023   4.  Patient will demonstrate left ankle MMT >/= 4/5 to faciltiate usual transfers, stairs, squatting at [facility] for daily life.  Baseline: See Objective data Goal status: Ongoing 04/01/2023   5.  Left ankle AROM DF >15* and PF >45* Baseline: See  Objective data Goal status: Ongoing 04/01/2023   6.  patient ambulates without assistive device >1000' and negotiates ramps, curbs & stairs single rail independently.  Baseline: See Objective data Goal status: Ongoing 04/01/2023    PLAN:  PT FREQUENCY:  2x/week  PT DURATION: 12 weeks  PLANNED INTERVENTIONS: Therapeutic exercises, Therapeutic  activity, Neuro Muscular re-education, Balance training, Gait training, Patient/Family education, Joint mobilization, Stair training, DME instructions, Dry Needling, Electrical stimulation, Traction, Cryotherapy, vasopneumatic deviceMoist heat, Taping, Ultrasound, Ionotophoresis 4mg /ml Dexamethasone, and aquatic therapy, Manual therapy.  All included unless contraindicated  PLAN FOR NEXT SESSION:  gait training without device or ASO including stairs, ramp & curb,  continue to progress standing exercises and balance activities.  manual therapy for range, consider vaso to end session.   Moshe Cipro, PT, DPT 04/16/2023, 11:41 AM

## 2023-04-21 ENCOUNTER — Ambulatory Visit: Payer: Medicare Other | Admitting: Physical Therapy

## 2023-04-21 ENCOUNTER — Encounter: Payer: Self-pay | Admitting: Physical Therapy

## 2023-04-21 DIAGNOSIS — R2689 Other abnormalities of gait and mobility: Secondary | ICD-10-CM

## 2023-04-21 DIAGNOSIS — M25672 Stiffness of left ankle, not elsewhere classified: Secondary | ICD-10-CM | POA: Diagnosis not present

## 2023-04-21 DIAGNOSIS — M25572 Pain in left ankle and joints of left foot: Secondary | ICD-10-CM | POA: Diagnosis not present

## 2023-04-21 DIAGNOSIS — R2681 Unsteadiness on feet: Secondary | ICD-10-CM

## 2023-04-21 DIAGNOSIS — R600 Localized edema: Secondary | ICD-10-CM

## 2023-04-21 DIAGNOSIS — M6281 Muscle weakness (generalized): Secondary | ICD-10-CM

## 2023-04-21 NOTE — Therapy (Signed)
OUTPATIENT PHYSICAL THERAPY TREATMENT  Patient Name: Holly Arellano MRN: [id number] DOB:[birth year], 67 y.o., female Today's Date: 04/21/2023     END OF SESSION:  PT End of Session - 04/21/23 0843     Visit Number 12    Number of Visits 25    Date for PT Re-Evaluation 06/06/23    Authorization Type [employer] Medicare    Authorization Time Period $10 copay    Progress Note Due on Visit 18    PT Start Time 0843    PT Stop Time 0926    PT Time Calculation (min) 43 min    Activity Tolerance Patient tolerated treatment well;Patient limited by pain    Behavior During Therapy [facility] for tasks assessed/performed                      Past Medical History:  Diagnosis Date   Chronic maxillary sinusitis 12/15/2007   Qualifier: Diagnosis of  By: Lovell Sheehan MD, John E    Cough variant asthma 10/19/2008   Qualifier: Diagnosis of  By: Lovell Sheehan MD, Balinda Quails    GERD (gastroesophageal reflux disease)    HEMORRHOIDS, INTERNAL 12/15/2007   Qualifier: Diagnosis of  By: Mayford Knife, [name], Domenic Polite    HIATAL HERNIA 12/15/2007   Qualifier: Diagnosis of  By: Mayford Knife, [name], [name]    Hypertension    MAMMOGRAM, ABNORMAL 08/01/2008   Qualifier: Diagnosis of  By: Mayford Knife [name], Domenic Polite    RAYNAUD'S DISEASE 12/15/2007   Qualifier: Diagnosis of  By: Mayford Knife [name], Domenic Polite    Past Surgical History:  Procedure Laterality Date   CHOLECYSTECTOMY     KNEE SURGERY  1994   ORIF ANKLE FRACTURE Left 02/05/2023   Procedure: OPEN REDUCTION INTERNAL FIXATION (ORIF) LEFT ANKLE FRACTURE W/ SCREWS AND PLATES;  Surgeon: Nadara Mustard, MD;  Location: [facility] OR;  Service: Orthopedics;  Laterality: Left;   trauma to aerm in machine   1996   Patient Active Problem List   Diagnosis Date Noted   Closed bimalleolar fracture of left ankle 02/05/2023   Vitamin D deficiency 04/25/2020   IGT (impaired glucose tolerance) 04/25/2020   Obesity, unspecified 05/18/2014   Hyperlipemia 05/18/2014   Hyperglycemia 05/18/2014   Allergic  rhinitis 12/15/2007   GERD 12/15/2007    PCP: Chaya Jan, MD  REFERRING PROVIDER: Barnie Del, NP  REFERRING DIAG: [id number] (ICD-10-CM) - Closed bimalleolar fracture of left ankle   THERAPY DIAG:  Muscle weakness (generalized)  Pain in left ankle and joints of left foot  Localized edema  Stiffness of left ankle, not elsewhere classified  Unsteadiness on feet  Other abnormalities of gait and mobility  Rationale for Evaluation and Treatment: Rehabilitation  ONSET DATE: 02/05/2023 surgery ORIF  SUBJECTIVE:   SUBJECTIVE STATEMENT: She continues to do her HEP. She walks in house without ASO with cane and ASO & cane for community.  PERTINENT HISTORY: Left ankle bimalleolar fx with ORIF 02/05/2023, asthma, HTN, Raynaud's disease, knee surgery left 1990's  PAIN:  NPRS scale: today  0/10 and since last PT 1/10 Pain location: left ankle posterior, foot forefoot & arch Pain description: throbbing Aggravating factors: standing & walking Relieving factors: tylenol, elevation & compression sock  PRECAUTIONS: Fall  WEIGHT BEARING RESTRICTIONS: Yes 03/17/2023 office note LLE WBAT in ASO  FALLS:  Has patient fallen in last 6 months? Yes. 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   Next MD visit:  04/10/2023  OBJECTIVE:  DIAGNOSTIC FINDINGS: 03/04/2023 Radiographs of the left ankle show stable alignment of fixation hardware for bimalleolar ankle fracture.  No complicating feature.   PATIENT SURVEYS:  04/10/2023  visit 9  47%  03/12/2023 / Eval: FOTO intake:  38%   predicted:  64%  COGNITION: 03/12/2023  Overall cognitive status: WFL    SENSATION: WFL  EDEMA:  03/12/2023 Circumferential:  LLE: above malleoli  26.5 cm,  around ankle heel to dorsum 36.4 cm, midfoot 27.6 cm RLE: above malleoli  22 cm,  around ankle heel to dorsum 32.1 cm, midfoot 24.6 cm  POSTURE: 03/12/2023 rounded shoulders, forward head, flexed trunk , and weight shift right  PALPATION: 03/12/2023  Tenderness medial & lateral ankle; tenderness forefoot dorsum & plantar surface  LOWER EXTREMITY ROM:   ROM Left eval Left  03/28/23 Left 04/08/23  Ankle dorsiflexion Supine  Knee ext A: -22* P: -19* Knee flex A: -15* P: -13* Supine  Knee ext A: -15* P: -8* Seated Knee ext A: -13* P: -5* Knee flexed A: -11* P: - 3*  Ankle plantarflexion Supine A: 25* P: 29* Supine A: 38* P: 45* Seated  A: 40* P: 45*  Ankle inversion Supine P: 2* Supine P: 16*   Ankle eversion Supine  P: 1* Supine P: 10*    (Blank rows = not tested)  LOWER EXTREMITY MMT:  MMT Left eval Left 04/08/23  Ankle dorsiflexion 3-/5 3+ within available ROM  Ankle plantarflexion 3-/5 seated 3+ within available ROM  Ankle inversion 3-/5 3+ within available ROM  Ankle eversion 3-/5 3+ within available ROM   (Blank rows = not tested)  FUNCTIONAL TESTS:  04/08/2023: SLS RLE: 7.41 sec  LLE 1.33 sec   18 inch chair transfer: requires use of armrests to arise.  Needed PT cues to limit LLE weight bearing per MD office visit 04/03/23: TUG 22.8 sec, SPC   GAIT: 04/08/2023:  Pt amb >150' with cane with verbal cues only. Pt neg ramp & curb with cane with supervision.  Pt amb 25' without device except ASO with supervision.  Gait Velocity with Cane 1.83 ft/sec and with no device 1.57 ft/sec   03/12/2023: Distance walked: 100' Assistive device utilized: Environmental consultant - 2 wheeled Level of assistance: SBA Comments: pt arrived to PT using RW & CAM boot with almost full weight bearing including taking 2 steps  away from RW (no UE support)   TODAY'S TREATMENT DATE: 04/21/2023: Therapeutic Exercise: Leg Press BLEs 87# 15 reps;  LLE only 43# 10 reps 2 sets Incline board gastroc stretch straddle step 2x30 sec; Lt soleus stretch on incline board 2x30 sec Heel raises on incline board BLEs x 15 reps; light touch for balance;  BLE concentric & LLE only eccentric 10 reps Standing on foam pad hip abduction 2x10; L3 band with light UE support needed Standing on foam pad hip extension 2x10; L3 band with light UE support needed   Neuromuscular Re-education: LLE SLS 5 reps up to 2 sec Tandem gait on foam beam including transition on/off foam forward LUE support x 3 laps in // bars Sidestepping on foam beam including transition on/off foam x 3  laps in // bars; intermittent UE support Braiding on floor 3 laps in //bars without touching Rockerboard with single pivot so has to control other plane of motion - ant/post and lateral x 2 min each with BUE light support //bars  Therapeutic Activities: Pt amb in clinic without device and without ASO safely. Walking up / down ramp 3 reps ea: forward facing, side step to right and side step to left.  PT recommending in home no device or ASO; in community for paved surfaces no ASO but cane; in community with uneven terrain cane & ASO. Pt verbalized understanding.    04/16/23 Therapeutic Exercise: Leg Press BLEs 87# 10 reps 2 sets: LLE only 43# 10 reps 2 sets Incline board stretch 3x30 sec; Lt soleus stretch on incline board 3x30 sec Heel raises x20 reps; light touch for balance Standing hip abduction 2x10; L3 band with light UE support needed Standing hip extension 2x10; L3 band with light UE support needed   Neuromuscular Re-education: Tandem gait forward / backward LUE support x 5 laps in // bars Sidestepping on foam x 5 laps in // bars; intermittent UE support Rockerboard ant/post and lateral x 3 min each   04/14/2023: Therapeutic Exercise: Leg Press BLEs  81# 10 reps 2 sets: LLE only 43# 10 reps 2 sets BAPS board LLE level 2 BUE support circles CW & CCW 5 reps 2 sets with PT manual assist for motion Gastroc & Soleus stretch on step heel depression 30 sec 2 reps  Neuromuscular Re-education: Tandem gait forward / backward LUE support  Toe walking forward BUE support 8' 3 reps Backwards walking without UE support 8' 3 reps Braiding BUE support //bars 8' 3 reps Standing on foam hip width apart: eyes open 10 reps then eyes closed 10 reps head motions right/left, up/down and diagonals.   Therapeutic Activities: Pt amb in clinic without device and without ASO safely. PT recommended ambulation in house without ASO or cane and in community with ASO & cane. Pt verbalized understanding.   Walking up / down ramp 3 reps ea: forward facing, side step to right and side step to left.  Stairs with 2 rails 11 steps: descend step-to using LLE and ascend alternating pattern     PATIENT EDUCATION:  Education details: gait, HEP, POC Person educated: Patient Education method: Explanation, Demonstration, Verbal cues, and Handouts Education comprehension: verbalized understanding, returned demonstration, and verbal cues required  HOME EXERCISE PROGRAM: Access Code: [email or web address] URL: [email or web address] Date: 04/10/2023 Prepared by: Vladimir Faster  Exercises - Seated Calf Stretch with Strap  - 3 x daily - 7 x weekly - 1 sets - 3-5 reps - 30 seconds hold - Seated Soleus Stretch with Strap  - 3 x daily - 7 x weekly - 1 sets - 3-5 reps - 30 seconds hold - standing calf stretch with forefoot on small step or brick  - 2 x daily - 7 x weekly - 1 sets - 3 reps - 30 seconds hold - Standing Soleus Stretch on Foam 1/2 Roll  - 2 x daily - 7 x weekly - 1 sets - 3 reps - 30 seconds hold - Seated Plantar Fascia Mobilization with Small Ball  - 1 x daily - 5 x weekly - 1 sets - 10 reps - 5 seconds hold - Seated Ankle Plantarflexion with Resistance  - 1 x daily  - 7 x weekly - 2 sets - 10 reps - 5 seconds hold - Seated Ankle Inversion with Anchored Resistance  -  1 x daily - 7 x weekly - 2 sets - 10 reps - 5 seconds hold - Seated Ankle Eversion with Anchored Resistance  - 1 x daily - 7 x weekly - 2 sets - 10 reps - 5 seconds hold - Long Sitting Ankle Dorsiflexion with Anchored Resistance  - 1 x daily - 7 x weekly - 2 sets - 10 reps - 5 seconds hold - Standing Tandem Balance with Counter Support  - 1 x daily - 7 x weekly - 1 sets - 2 reps - 30 seconds hold - Standing Gastroc Stretch at Counter  - 2 x daily - 7 x weekly - 1 sets - 3 reps - 30 seconds hold - Standing Soleus Stretch  - 2 x daily - 7 x weekly - 1 sets - 3 reps - 30 seconds hold - Heel raises near counter  - 2 x daily - 7 x weekly - 2 sets - 10 reps - 5 seconds hold - Standing Toe Raises at Chair  - 2 x daily - 7 x weekly - 2 sets - 10 reps - 5 seconds hold - Tandem Stance  - 1 x daily - 7 x weekly - 1 sets - 2 reps - 30 seconds hold - Wide stance on Foam Pad head movements  - 1 x daily - 7 x weekly - 2 sets - 10 reps Hand written  -inversion & eversion stretch standing with lateral & medial foot on towel - 30 sec 3 reps 2x/day  ASSESSMENT: CLINICAL IMPRESSION: Patient is slowly improving functional strength & balance with PT intervention.  She appears to understand PT recommendations for gait.   OBJECTIVE IMPAIRMENTS: Abnormal gait, decreased activity tolerance, decreased balance, decreased endurance, decreased knowledge of condition, decreased knowledge of use of DME, decreased mobility, difficulty walking, decreased ROM, decreased strength, increased edema, impaired flexibility, postural dysfunction, obesity, and pain.   ACTIVITY LIMITATIONS: carrying, lifting, bending, standing, squatting, sleeping, stairs, transfers, and locomotion level  PARTICIPATION LIMITATIONS: meal prep, cleaning, laundry, driving, community activity, and occupation  PERSONAL FACTORS: Age, Fitness, Time since  onset of injury/illness/exacerbation, and 3+ comorbidities: see PMH  are also affecting patient's functional outcome.   REHAB POTENTIAL: Good  CLINICAL DECISION MAKING: Stable/uncomplicated  EVALUATION COMPLEXITY: Low   GOALS: Goals reviewed with patient? Yes  SHORT TERM GOALS: (target date for Short term goals 04/11/2023)   1.  Patient will demonstrate independent use of home exercise program to maintain progress from in clinic treatments. Baseline: See objective data Goal status: MET  04/10/2023  2. PROM left ankle DF 0* Baseline: See objective data Goal status: Ongoing 04/10/2023  3. Interim FOTO >45% Baseline: See objective data Goal status: MET  04/10/2023   LONG TERM GOALS: (target dates for all long term goals  06/06/2023 )   1. Patient will demonstrate/report pain at worst less than or equal to 2/10 to facilitate minimal limitation in daily activity secondary to pain symptoms. Baseline: See Objective data Goal status: Ongoing 04/01/2023   2. Patient will demonstrate independent use of home exercise program to facilitate ability to maintain/progress functional gains from skilled physical therapy services. Baseline: See Objective data Goal status: Ongoing 04/01/2023   3. Patient will demonstrate FOTO outcome > or = 64 % to indicate reduced disability due to condition. Baseline: See Objective data Goal status: Ongoing 04/01/2023   4.  Patient will demonstrate left ankle MMT >/= 4/5 to faciltiate usual transfers, stairs, squatting at [facility] for daily life.  Baseline: See Objective  data Goal status: Ongoing 04/01/2023   5.  Left ankle AROM DF >15* and PF >45* Baseline: See Objective data Goal status: Ongoing 04/01/2023   6.  patient ambulates without assistive device >1000' and negotiates ramps, curbs & stairs single rail independently.  Baseline: See Objective data Goal status: Ongoing 04/01/2023    PLAN:  PT FREQUENCY:  2x/week  PT DURATION: 12 weeks  PLANNED  INTERVENTIONS: Therapeutic exercises, Therapeutic activity, Neuro Muscular re-education, Balance training, Gait training, Patient/Family education, Joint mobilization, Stair training, DME instructions, Dry Needling, Electrical stimulation, Traction, Cryotherapy, vasopneumatic deviceMoist heat, Taping, Ultrasound, Ionotophoresis 4mg /ml Dexamethasone, and aquatic therapy, Manual therapy.  All included unless contraindicated  PLAN FOR NEXT SESSION: check ROM,  gait training without device or ASO including stairs, ramp & curb,  continue to progress standing exercises and balance activities. vaso to end session prn.   Vladimir Faster, PT, DPT 04/21/2023, 9:27 AM

## 2023-04-23 ENCOUNTER — Other Ambulatory Visit: Payer: Self-pay

## 2023-04-23 ENCOUNTER — Encounter: Payer: Self-pay | Admitting: Physical Therapy

## 2023-04-23 ENCOUNTER — Telehealth: Payer: Self-pay | Admitting: Physical Therapy

## 2023-04-23 ENCOUNTER — Ambulatory Visit: Payer: Medicare Other | Admitting: Physical Therapy

## 2023-04-23 DIAGNOSIS — M25672 Stiffness of left ankle, not elsewhere classified: Secondary | ICD-10-CM

## 2023-04-23 DIAGNOSIS — M25572 Pain in left ankle and joints of left foot: Secondary | ICD-10-CM

## 2023-04-23 DIAGNOSIS — R600 Localized edema: Secondary | ICD-10-CM | POA: Diagnosis not present

## 2023-04-23 DIAGNOSIS — R2681 Unsteadiness on feet: Secondary | ICD-10-CM

## 2023-04-23 DIAGNOSIS — M6281 Muscle weakness (generalized): Secondary | ICD-10-CM

## 2023-04-23 DIAGNOSIS — R2689 Other abnormalities of gait and mobility: Secondary | ICD-10-CM

## 2023-04-23 NOTE — Telephone Encounter (Signed)
Holly Arellano is scheduled to return to work on 05/05/2023.  She will need a doctor's note to be able to wear tennis shoes at work.  Can you please write a note and leave it at your check in desk? [name]

## 2023-04-23 NOTE — Therapy (Signed)
OUTPATIENT PHYSICAL THERAPY TREATMENT  Patient Name: Holly Arellano MRN: [id number] DOB:Feb 15, 1956, 67 y.o., female Today's Date: 04/23/2023     END OF SESSION:  PT End of Session - 04/23/23 1145     Visit Number 13    Number of Visits 25    Date for PT Re-Evaluation 06/06/23    Authorization Type [employer]BS Medicare    Authorization Time Period $10 copay    Progress Note Due on Visit 18    PT Start Time 1145    PT Stop Time 1231    PT Time Calculation (min) 46 min    Activity Tolerance Patient tolerated treatment well;Patient limited by pain    Behavior During Therapy [facility] for tasks assessed/performed                       Past Medical History:  Diagnosis Date   Chronic maxillary sinusitis 12/15/2007   Qualifier: Diagnosis of  By: Lovell Sheehan MD, John E    Cough variant asthma 10/19/2008   Qualifier: Diagnosis of  By: Lovell Sheehan MD, John E    GERD (gastroesophageal reflux disease)    HEMORRHOIDS, INTERNAL 12/15/2007   Qualifier: Diagnosis of  By: Mayford Knife, [name], Domenic Polite    HIATAL HERNIA 12/15/2007   Qualifier: Diagnosis of  By: Mayford Knife, [name], [name]    Hypertension    MAMMOGRAM, ABNORMAL 08/01/2008   Qualifier: Diagnosis of  By: Mayford Knife [name], Domenic Polite    RAYNAUD'S DISEASE 12/15/2007   Qualifier: Diagnosis of  By: Mayford Knife [name], Domenic Polite    Past Surgical History:  Procedure Laterality Date   CHOLECYSTECTOMY     KNEE SURGERY  1994   ORIF ANKLE FRACTURE Left 02/05/2023   Procedure: OPEN REDUCTION INTERNAL FIXATION (ORIF) LEFT ANKLE FRACTURE W/ SCREWS AND PLATES;  Surgeon: Nadara Mustard, MD;  Location: [facility] OR;  Service: Orthopedics;  Laterality: Left;   trauma to aerm in machine   1996   Patient Active Problem List   Diagnosis Date Noted   Closed bimalleolar fracture of left ankle 02/05/2023   Vitamin D deficiency 04/25/2020   IGT (impaired glucose tolerance) 04/25/2020   Obesity, unspecified 05/18/2014   Hyperlipemia 05/18/2014   Hyperglycemia 05/18/2014    Allergic rhinitis 12/15/2007   GERD 12/15/2007    PCP: Chaya Jan, MD  REFERRING PROVIDER: Barnie Del, NP  REFERRING DIAG: [phone] (ICD-10-CM) - Closed bimalleolar fracture of left ankle   THERAPY DIAG:  Muscle weakness (generalized)  Pain in left ankle and joints of left foot  Localized edema  Stiffness of left ankle, not elsewhere classified  Unsteadiness on feet  Other abnormalities of gait and mobility  Rationale for Evaluation and Treatment: Rehabilitation  ONSET DATE: 02/05/2023 surgery ORIF  SUBJECTIVE:   SUBJECTIVE STATEMENT: She is scheduled to go back to work 8/12 and will need a note to be able to wear sneakers to work. (PT sent telephone encounter to Dr. Lajoyce Corners)  PERTINENT HISTORY: Left ankle bimalleolar fx with ORIF 02/05/2023, asthma, HTN, Raynaud's disease, knee surgery left 1990's  PAIN:  NPRS scale: today 0/10 and since last PT 3/10 when she first stands up Pain location: left ankle posterior, foot forefoot & arch Pain description: throbbing Aggravating factors: standing & walking Relieving factors: tylenol, elevation & compression sock  PRECAUTIONS: Fall  WEIGHT BEARING RESTRICTIONS: Yes 03/17/2023 office note LLE WBAT in ASO  FALLS:  Has patient fallen in last 6 months? Yes. 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   Next MD visit:  04/10/2023  OBJECTIVE:  DIAGNOSTIC FINDINGS: 03/04/2023 Radiographs of the left ankle show stable alignment of fixation hardware for bimalleolar ankle fracture.  No complicating feature.   PATIENT  SURVEYS:  04/10/2023  visit 9  47%  03/12/2023 / Eval: FOTO intake:  38%  predicted:  64%  COGNITION: 03/12/2023  Overall cognitive status: WFL    SENSATION: WFL  EDEMA:  03/12/2023 Circumferential:  LLE: above malleoli  26.5 cm,  around ankle heel to dorsum 36.4 cm, midfoot 27.6 cm RLE: above malleoli  22 cm,  around ankle heel to dorsum 32.1 cm, midfoot 24.6 cm  POSTURE: 03/12/2023 rounded shoulders, forward head, flexed trunk , and weight shift right  PALPATION: 03/12/2023  Tenderness medial & lateral ankle; tenderness forefoot dorsum & plantar surface  LOWER EXTREMITY ROM:   ROM Left eval Left  03/28/23 Left 04/08/23 Left 04/23/23  Ankle dorsiflexion Supine  Knee ext A: -22* P: -19* Knee flex A: -15* P: -13* Supine  Knee ext A: -15* P: -8* Seated Knee ext A: -13* P: -5* Knee flexed A: -11* P: - 3* Seated Knee ext A: -8* P: -1* Knee flexed A: -1* P: +2*  Ankle plantarflexion Supine A: 25* P: 29* Supine A: 38* P: 45* Seated  A: 40* P: 45*   Ankle inversion Supine P: 2* Supine P: 16*    Ankle eversion Supine  P: 1* Supine P: 10*     (Blank rows = not tested)  LOWER EXTREMITY MMT:  MMT Left eval Left 04/08/23  Ankle dorsiflexion 3-/5 3+ within available ROM  Ankle plantarflexion 3-/5 seated 3+ within available ROM  Ankle inversion 3-/5 3+ within available ROM  Ankle eversion 3-/5 3+ within available ROM   (Blank rows = not tested)  FUNCTIONAL TESTS:  04/08/2023: SLS RLE: 7.41 sec  LLE 1.33 sec   18 inch chair transfer: requires use of armrests to arise.  Needed PT cues to limit LLE weight bearing per MD office visit 04/03/23: TUG 22.8 sec, SPC   GAIT: 04/08/2023:  Pt amb >150' with cane with verbal cues only. Pt neg ramp & curb with cane with supervision.  Pt amb 25' without device except ASO with supervision.  Gait Velocity with Cane 1.83 ft/sec and with no device 1.57 ft/sec   03/12/2023: Distance walked: 100' Assistive device  utilized: Environmental consultant - 2 wheeled Level of assistance: SBA Comments: pt arrived to PT using RW & CAM boot with almost full weight bearing including taking 2 steps away from RW (no UE support)   TODAY'S TREATMENT DATE: 04/23/2023: Therapeutic Exercise: Leg Press BLEs 87# 15 reps;  LLE only 43# 15 reps 2 sets Leg press machine heel raise BLEs 87# 15 reps; LLE only 37# 10 reps Incline board on step heel depression 2x30 sec; Lt soleus stretch on incline board 2x30 sec Heel raises BLE concentric & LLE only eccentric 10 reps Standing on foam pad hip abduction alternating LEs 2x10; L3 band with light single UE support needed Standing on foam pad hip extension alternating LEs 2x10; L3 band with single light UE support needed  Neuromuscular Re-education: LLE SLS 5 reps up to 2 sec Stepping LLE onto foam beam SLS tapping RLE to 8" step 15 reps Sidestepping on foam beam transitioning firm to/from compliant surface 15 reps leading with RLE & 15 reps leading with LLE Braiding on floor 3 laps in //bars with light UE support Rockerboard with single pivot so has to control other plane of motion - ant/post and lateral x 2 min each with BUE support //bars  Therapeutic Activities: Pt amb in clinic without device and without ASO safely. Walking up / down ramp 3 reps ea: forward facing, side step to right and side step to left.  PT reviewed recommendation in home no device or ASO; in community for paved surfaces no ASO but cane; in community with uneven terrain cane & ASO.     04/21/2023: Therapeutic Exercise: Leg Press BLEs 87# 15 reps;  LLE only 43# 10 reps 2 sets Incline board gastroc stretch straddle step 2x30 sec; Lt soleus stretch on incline board 2x30 sec Heel raises on incline board BLEs x 15 reps; light touch for balance;  BLE concentric & LLE only eccentric 10 reps Standing on foam pad hip abduction 2x10; L3 band with light UE support needed Standing on foam pad hip extension 2x10; L3 band with  light UE support needed   Neuromuscular Re-education: LLE SLS 5 reps up to 2 sec Tandem gait on foam beam including transition on/off foam forward LUE support x 3 laps in // bars Sidestepping on foam beam including transition on/off foam x 3 laps in // bars; intermittent UE support Braiding on floor 3 laps in //bars without touching Rockerboard with single pivot so has to control other plane of motion - ant/post and lateral x 2 min each with BUE light support //bars  Therapeutic Activities: Pt amb in clinic without device and without ASO safely. Walking up / down ramp 3 reps ea: forward facing, side step to right and side step to left.  PT recommending in home no device or ASO; in community for paved surfaces no ASO but cane; in community with uneven terrain cane & ASO. Pt verbalized understanding.    04/16/23 Therapeutic Exercise: Leg Press BLEs 87# 10 reps 2 sets: LLE only 43# 10 reps 2 sets Incline board stretch 3x30 sec; Lt soleus stretch on incline board 3x30 sec Heel raises x20 reps; light touch for balance Standing hip abduction 2x10; L3 band with light UE support needed Standing hip extension 2x10; L3 band with light UE support needed   Neuromuscular Re-education: Tandem gait forward / backward LUE support x 5 laps in // bars Sidestepping on foam x 5 laps in // bars; intermittent UE support Rockerboard ant/post and lateral x 3 min each     PATIENT EDUCATION:  Education details: gait, HEP, POC Person educated: Patient Education method: Programmer, multimedia, Demonstration, Verbal cues, and Handouts Education comprehension: verbalized understanding, returned demonstration, and verbal cues required  HOME EXERCISE PROGRAM: Access Code: [email or web address] URL: [email or web address] Date: 04/10/2023 Prepared by: Vladimir Faster  Exercises - Seated Calf Stretch with Strap  - 3 x daily - 7 x weekly - 1 sets - 3-5 reps - 30 seconds hold - Seated Soleus Stretch with Strap  - 3 x  daily - 7 x weekly - 1 sets - 3-5 reps - 30 seconds hold - standing calf stretch with forefoot on small step or brick  - 2 x daily - 7 x weekly - 1 sets - 3 reps - 30 seconds hold -  Standing Soleus Stretch on Foam 1/2 Roll  - 2 x daily - 7 x weekly - 1 sets - 3 reps - 30 seconds hold - Seated Plantar Fascia Mobilization with Small Ball  - 1 x daily - 5 x weekly - 1 sets - 10 reps - 5 seconds hold - Seated Ankle Plantarflexion with Resistance  - 1 x daily - 7 x weekly - 2 sets - 10 reps - 5 seconds hold - Seated Ankle Inversion with Anchored Resistance  - 1 x daily - 7 x weekly - 2 sets - 10 reps - 5 seconds hold - Seated Ankle Eversion with Anchored Resistance  - 1 x daily - 7 x weekly - 2 sets - 10 reps - 5 seconds hold - Long Sitting Ankle Dorsiflexion with Anchored Resistance  - 1 x daily - 7 x weekly - 2 sets - 10 reps - 5 seconds hold - Standing Tandem Balance with Counter Support  - 1 x daily - 7 x weekly - 1 sets - 2 reps - 30 seconds hold - Standing Gastroc Stretch at Counter  - 2 x daily - 7 x weekly - 1 sets - 3 reps - 30 seconds hold - Standing Soleus Stretch  - 2 x daily - 7 x weekly - 1 sets - 3 reps - 30 seconds hold - Heel raises near counter  - 2 x daily - 7 x weekly - 2 sets - 10 reps - 5 seconds hold - Standing Toe Raises at Chair  - 2 x daily - 7 x weekly - 2 sets - 10 reps - 5 seconds hold - Tandem Stance  - 1 x daily - 7 x weekly - 1 sets - 2 reps - 30 seconds hold - Wide stance on Foam Pad head movements  - 1 x daily - 7 x weekly - 2 sets - 10 reps Hand written  -inversion & eversion stretch standing with lateral & medial foot on towel - 30 sec 3 reps 2x/day  ASSESSMENT: CLINICAL IMPRESSION: Patient's ROM improved.  She appears to be able to utilize ankle better for balance activities.  She continues to be fearful to walk without the ASO but appears to safely ambulate on firm surfaces without it.  OBJECTIVE IMPAIRMENTS: Abnormal gait, decreased activity tolerance,  decreased balance, decreased endurance, decreased knowledge of condition, decreased knowledge of use of DME, decreased mobility, difficulty walking, decreased ROM, decreased strength, increased edema, impaired flexibility, postural dysfunction, obesity, and pain.   ACTIVITY LIMITATIONS: carrying, lifting, bending, standing, squatting, sleeping, stairs, transfers, and locomotion level  PARTICIPATION LIMITATIONS: meal prep, cleaning, laundry, driving, community activity, and occupation  PERSONAL FACTORS: Age, Fitness, Time since onset of injury/illness/exacerbation, and 3+ comorbidities: see PMH  are also affecting patient's functional outcome.   REHAB POTENTIAL: Good  CLINICAL DECISION MAKING: Stable/uncomplicated  EVALUATION COMPLEXITY: Low   GOALS: Goals reviewed with patient? Yes  SHORT TERM GOALS: (target date for Short term goals 04/11/2023)   1.  Patient will demonstrate independent use of home exercise program to maintain progress from in clinic treatments. Baseline: See objective data Goal status: MET  04/10/2023  2. PROM left ankle DF 0* Baseline: See objective data Goal status: MET 04/23/2023  3. Interim FOTO >45% Baseline: See objective data Goal status: MET  04/10/2023   LONG TERM GOALS: (target dates for all long term goals  06/06/2023 )   1. Patient will demonstrate/report pain at worst less than or equal to 2/10 to facilitate minimal  limitation in daily activity secondary to pain symptoms. Baseline: See Objective data Goal status: Ongoing 04/23/2023   2. Patient will demonstrate independent use of home exercise program to facilitate ability to maintain/progress functional gains from skilled physical therapy services. Baseline: See Objective data Goal status: Ongoing 04/23/2023   3. Patient will demonstrate FOTO outcome > or = 64 % to indicate reduced disability due to condition. Baseline: See Objective data Goal status: Ongoing 04/23/2023   4.  Patient will  demonstrate left ankle MMT >/= 4/5 to faciltiate usual transfers, stairs, squatting at [facility] for daily life.  Baseline: See Objective data Goal status: Ongoing 04/23/2023   5.  Left ankle AROM DF >15* and PF >45* Baseline: See Objective data Goal status: Ongoing 04/23/2023   6.  patient ambulates without assistive device >1000' and negotiates ramps, curbs & stairs single rail independently.  Baseline: See Objective data Goal status: Ongoing 04/23/2023    PLAN:  PT FREQUENCY:  2x/week  PT DURATION: 12 weeks  PLANNED INTERVENTIONS: Therapeutic exercises, Therapeutic activity, Neuro Muscular re-education, Balance training, Gait training, Patient/Family education, Joint mobilization, Stair training, DME instructions, Dry Needling, Electrical stimulation, Traction, Cryotherapy, vasopneumatic deviceMoist heat, Taping, Ultrasound, Ionotophoresis 4mg /ml Dexamethasone, and aquatic therapy, Manual therapy.  All included unless contraindicated  PLAN FOR NEXT SESSION: continue gait training without device or ASO including stairs, ramp & curb,  continue to progress standing exercises and balance activities. vaso to end session prn.   Vladimir Faster, PT, DPT 04/23/2023, 12:37 PM

## 2023-04-23 NOTE — Telephone Encounter (Signed)
The letter is ready and at the desk for the pt! Let me know if she needs anything else.

## 2023-04-24 ENCOUNTER — Ambulatory Visit (INDEPENDENT_AMBULATORY_CARE_PROVIDER_SITE_OTHER): Payer: Medicare Other | Admitting: Internal Medicine

## 2023-04-24 ENCOUNTER — Encounter: Payer: Self-pay | Admitting: Internal Medicine

## 2023-04-24 VITALS — BP 128/78 | HR 76 | Temp 98.2°F | Wt 209.6 lb

## 2023-04-24 DIAGNOSIS — R7302 Impaired glucose tolerance (oral): Secondary | ICD-10-CM

## 2023-04-24 DIAGNOSIS — E559 Vitamin D deficiency, unspecified: Secondary | ICD-10-CM

## 2023-04-24 DIAGNOSIS — E6609 Other obesity due to excess calories: Secondary | ICD-10-CM

## 2023-04-24 DIAGNOSIS — Z6833 Body mass index (BMI) 33.0-33.9, adult: Secondary | ICD-10-CM

## 2023-04-24 DIAGNOSIS — E782 Mixed hyperlipidemia: Secondary | ICD-10-CM | POA: Diagnosis not present

## 2023-04-24 DIAGNOSIS — S82842D Displaced bimalleolar fracture of left lower leg, subsequent encounter for closed fracture with routine healing: Secondary | ICD-10-CM

## 2023-04-24 LAB — POCT GLYCOSYLATED HEMOGLOBIN (HGB A1C): Hemoglobin A1C: 5.8 % — AB (ref 4.0–5.6)

## 2023-04-24 NOTE — Assessment & Plan Note (Signed)
He has not yet started taking atorvastatin prescribed.  Her last LDL was 160.  Have discussed importance of medication adherence, check lipids next visit.

## 2023-04-24 NOTE — Assessment & Plan Note (Signed)
Status post surgery, is currently attending PT.  Is now on crutches.

## 2023-04-24 NOTE — Assessment & Plan Note (Signed)
Discussed healthy lifestyle, including increased physical activity and better food choices to promote weight loss.  

## 2023-04-24 NOTE — Assessment & Plan Note (Signed)
Recheck levels next visit.

## 2023-04-24 NOTE — Progress Notes (Signed)
Established Patient Office Visit     CC/Reason for Visit: Follow-up chronic conditions  HPI: Holly Arellano is a 67 y.o. female who is coming in today for the above mentioned reasons. Past Medical History is significant for: Hypertension, hyperlipidemia, obesity, impaired glucose tolerance, vitamin D deficiency.  She suffered an ankle fracture in May that required surgical repair.  She is still undergoing physical therapy, has been released to walk on crutches now.  Her blood pressure is noted to be elevated today, however she states that at home has been in the 130s to low 80 range.   Past Medical/Surgical History: Past Medical History:  Diagnosis Date   Chronic maxillary sinusitis 12/15/2007   Qualifier: Diagnosis of  By: Lovell Sheehan MD, John E    Cough variant asthma 10/19/2008   Qualifier: Diagnosis of  By: Lovell Sheehan MD, Balinda Quails    GERD (gastroesophageal reflux disease)    HEMORRHOIDS, INTERNAL 12/15/2007   Qualifier: Diagnosis of  By: Mayford Knife, [name], Domenic Polite    HIATAL HERNIA 12/15/2007   Qualifier: Diagnosis of  By: Mayford Knife, [name], [name]    Hypertension    MAMMOGRAM, ABNORMAL 08/01/2008   Qualifier: Diagnosis of  By: Mayford Knife [name], Domenic Polite    RAYNAUD'S DISEASE 12/15/2007   Qualifier: Diagnosis of  By: Mayford Knife [name], Domenic Polite     Past Surgical History:  Procedure Laterality Date   CHOLECYSTECTOMY     KNEE SURGERY  1994   ORIF ANKLE FRACTURE Left 02/05/2023   Procedure: OPEN REDUCTION INTERNAL FIXATION (ORIF) LEFT ANKLE FRACTURE W/ SCREWS AND PLATES;  Surgeon: Nadara Mustard, MD;  Location: [facility] OR;  Service: Orthopedics;  Laterality: Left;   trauma to aerm in machine   1996    Social History:  reports that she has never smoked. She has never used smokeless tobacco. She reports that she does not drink alcohol and does not use drugs.  Allergies: Allergies  Allergen Reactions   Latex Rash    Skin rash     Family History:  Family History  Problem Relation Age of Onset    Diabetes Mother    Hypertension Father      Current Outpatient Medications:    amLODipine (NORVASC) 5 MG tablet, TAKE 1 TABLET(5 MG) BY MOUTH DAILY, Disp: 90 tablet, Rfl: 1   atorvastatin (LIPITOR) 20 MG tablet, Take 1 tablet (20 mg total) by mouth at bedtime., Disp: 90 tablet, Rfl: 1   HYDROcodone-acetaminophen (NORCO/VICODIN) 5-325 MG tablet, Take 1 tablet by mouth every 4 (four) hours as needed., Disp: 30 tablet, Rfl: 0   ondansetron (ZOFRAN) 4 MG tablet, Take 4 mg by mouth every 6 (six) hours., Disp: , Rfl:    Vitamin D, Ergocalciferol, (DRISDOL) 1.25 MG (50000 UNIT) CAPS capsule, TAKE 1 CAPSULE BY MOUTH EVERY 7 DAYS FOR 12 DOSES, Disp: 12 capsule, Rfl: 0  Review of Systems:  Negative unless indicated in HPI.   Physical Exam: Vitals:   04/24/23 0734 04/24/23 0739 04/24/23 0800  BP: (!) 140/90 (!) 149/94 128/78  Pulse: 76    Temp: 98.2 F (36.8 C)    TempSrc: Oral    SpO2: 98%    Weight: 209 lb 9.6 oz (95.1 kg)      Body mass index is 33.83 kg/m.   Physical Exam Vitals reviewed.  Constitutional:      Appearance: Normal appearance.  HENT:     Head: Normocephalic and atraumatic.  Eyes:     Conjunctiva/sclera: Conjunctivae normal.  Pupils: Pupils are equal, round, and reactive to light.  Cardiovascular:     Rate and Rhythm: Normal rate and regular rhythm.  Pulmonary:     Effort: Pulmonary effort is normal.     Breath sounds: Normal breath sounds.  Skin:    General: Skin is warm and dry.  Neurological:     General: No focal deficit present.     Mental Status: She is alert and oriented to person, place, and time.  Psychiatric:        Mood and Affect: Mood normal.        Behavior: Behavior normal.        Thought Content: Thought content normal.        Judgment: Judgment normal.      Impression and Plan:  IGT (impaired glucose tolerance) Assessment & Plan: A1c is stable at 5.8, have discussed lifestyle changes at length.  Orders: -     POCT  glycosylated hemoglobin (Hb A1C)  Mixed hyperlipidemia Assessment & Plan: He has not yet started taking atorvastatin prescribed.  Her last LDL was 160.  Have discussed importance of medication adherence, check lipids next visit.   Closed bimalleolar fracture of left ankle with routine healing, subsequent encounter Assessment & Plan: Status post surgery, is currently attending PT.  Is now on crutches.   Vitamin D deficiency Assessment & Plan: Recheck levels next visit.   Class 1 obesity due to excess calories with serious comorbidity and body mass index (BMI) of 33.0 to 33.9 in adult Assessment & Plan: -Discussed healthy lifestyle, including increased physical activity and better food choices to promote weight loss.       Time spent:31 minutes reviewing chart, interviewing and examining patient and formulating plan of care.     Chaya Jan, MD [facility]

## 2023-04-24 NOTE — Assessment & Plan Note (Signed)
A1c is stable at 5.8, have discussed lifestyle changes at length.

## 2023-04-28 ENCOUNTER — Encounter: Payer: Self-pay | Admitting: Physical Therapy

## 2023-04-28 ENCOUNTER — Ambulatory Visit: Payer: Medicare Other | Admitting: Physical Therapy

## 2023-04-28 DIAGNOSIS — M25572 Pain in left ankle and joints of left foot: Secondary | ICD-10-CM

## 2023-04-28 DIAGNOSIS — M6281 Muscle weakness (generalized): Secondary | ICD-10-CM

## 2023-04-28 DIAGNOSIS — R2689 Other abnormalities of gait and mobility: Secondary | ICD-10-CM

## 2023-04-28 DIAGNOSIS — M25672 Stiffness of left ankle, not elsewhere classified: Secondary | ICD-10-CM

## 2023-04-28 DIAGNOSIS — R2681 Unsteadiness on feet: Secondary | ICD-10-CM

## 2023-04-28 DIAGNOSIS — R600 Localized edema: Secondary | ICD-10-CM | POA: Diagnosis not present

## 2023-04-28 NOTE — Therapy (Signed)
OUTPATIENT PHYSICAL THERAPY TREATMENT  Patient Name: Holly Arellano MRN: [id number] DOB:06-19-1956, 67 y.o., female Today's Date: 04/28/2023     END OF SESSION:  PT End of Session - 04/28/23 0852     Visit Number 14    Number of Visits 25    Date for PT Re-Evaluation 06/06/23    Authorization Type [employer]BS Medicare    Authorization Time Period $10 copay    Progress Note Due on Visit 18    PT Start Time 0846    PT Stop Time 0929    PT Time Calculation (min) 43 min    Activity Tolerance Patient tolerated treatment well;Patient limited by pain    Behavior During Therapy [facility] for tasks assessed/performed                        Past Medical History:  Diagnosis Date   Chronic maxillary sinusitis 12/15/2007   Qualifier: Diagnosis of  By: Lovell Sheehan MD, John E    Cough variant asthma 10/19/2008   Qualifier: Diagnosis of  By: Lovell Sheehan MD, Balinda Quails    GERD (gastroesophageal reflux disease)    HEMORRHOIDS, INTERNAL 12/15/2007   Qualifier: Diagnosis of  By: Mayford Knife, [name], Domenic Polite    HIATAL HERNIA 12/15/2007   Qualifier: Diagnosis of  By: Mayford Knife, [name], [name]    Hypertension    MAMMOGRAM, ABNORMAL 08/01/2008   Qualifier: Diagnosis of  By: Mayford Knife [name], Domenic Polite    RAYNAUD'S DISEASE 12/15/2007   Qualifier: Diagnosis of  By: Mayford Knife [name], Domenic Polite    Past Surgical History:  Procedure Laterality Date   CHOLECYSTECTOMY     KNEE SURGERY  1994   ORIF ANKLE FRACTURE Left 02/05/2023   Procedure: OPEN REDUCTION INTERNAL FIXATION (ORIF) LEFT ANKLE FRACTURE W/ SCREWS AND PLATES;  Surgeon: Nadara Mustard, MD;  Location: [facility] OR;  Service: Orthopedics;  Laterality: Left;   trauma to aerm in machine   1996   Patient Active Problem List   Diagnosis Date Noted   Closed bimalleolar fracture of left ankle 02/05/2023   Vitamin D deficiency 04/25/2020   IGT (impaired glucose tolerance) 04/25/2020   Obesity, unspecified 05/18/2014   Hyperlipemia 05/18/2014   Hyperglycemia 05/18/2014    Allergic rhinitis 12/15/2007   GERD 12/15/2007    PCP: Chaya Jan, MD  REFERRING PROVIDER: Barnie Del, NP  REFERRING DIAG: [phone] (ICD-10-CM) - Closed bimalleolar fracture of left ankle   THERAPY DIAG:  Muscle weakness (generalized)  Pain in left ankle and joints of left foot  Localized edema  Stiffness of left ankle, not elsewhere classified  Unsteadiness on feet  Other abnormalities of gait and mobility  Rationale for Evaluation and Treatment: Rehabilitation  ONSET DATE: 02/05/2023 surgery ORIF  SUBJECTIVE:   SUBJECTIVE STATEMENT: She has been walking without ASO some.   PERTINENT HISTORY: Left ankle bimalleolar fx with ORIF 02/05/2023, asthma, HTN, Raynaud's disease, knee surgery left 1990's  PAIN:  NPRS scale: today 0/10 and since last PT no pain Pain location: left ankle posterior, foot forefoot & arch Pain description: throbbing Aggravating factors: standing & walking Relieving factors: tylenol, elevation & compression sock  PRECAUTIONS: Fall  WEIGHT BEARING RESTRICTIONS: Yes 03/17/2023 office note LLE WBAT in ASO  FALLS:  Has patient fallen in last 6 months? Yes. 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   Next MD visit:  04/10/2023  OBJECTIVE:  DIAGNOSTIC FINDINGS: 03/04/2023 Radiographs of the left ankle show stable alignment of fixation hardware for bimalleolar ankle fracture.  No complicating feature.   PATIENT SURVEYS:  04/10/2023  visit 9  47%  03/12/2023 / Eval: FOTO intake:  38%  predicted:  64%  COGNITION: 03/12/2023  Overall  cognitive status: WFL    SENSATION: WFL  EDEMA:  03/12/2023 Circumferential:  LLE: above malleoli  26.5 cm,  around ankle heel to dorsum 36.4 cm, midfoot 27.6 cm RLE: above malleoli  22 cm,  around ankle heel to dorsum 32.1 cm, midfoot 24.6 cm  POSTURE: 03/12/2023 rounded shoulders, forward head, flexed trunk , and weight shift right  PALPATION: 03/12/2023  Tenderness medial & lateral ankle; tenderness forefoot dorsum & plantar surface  LOWER EXTREMITY ROM:   ROM Left eval Left  03/28/23 Left 04/08/23 Left 04/23/23  Ankle dorsiflexion Supine  Knee ext A: -22* P: -19* Knee flex A: -15* P: -13* Supine  Knee ext A: -15* P: -8* Seated Knee ext A: -13* P: -5* Knee flexed A: -11* P: - 3* Seated Knee ext A: -8* P: -1* Knee flexed A: -1* P: +2*  Ankle plantarflexion Supine A: 25* P: 29* Supine A: 38* P: 45* Seated  A: 40* P: 45*   Ankle inversion Supine P: 2* Supine P: 16*    Ankle eversion Supine  P: 1* Supine P: 10*     (Blank rows = not tested)  LOWER EXTREMITY MMT:  MMT Left eval Left 04/08/23  Ankle dorsiflexion 3-/5 3+ within available ROM  Ankle plantarflexion 3-/5 seated 3+ within available ROM  Ankle inversion 3-/5 3+ within available ROM  Ankle eversion 3-/5 3+ within available ROM   (Blank rows = not tested)  FUNCTIONAL TESTS:  04/28/2023: SLS RLE: 8.51 sec  LLE 2.83 sec  04/08/2023: SLS RLE: 7.41 sec  LLE 1.33 sec   18 inch chair transfer: requires use of armrests to arise.  Needed PT cues to limit LLE weight bearing per MD office visit 04/03/23: TUG 22.8 sec, SPC   GAIT: 04/08/2023:  Pt amb >150' with cane with verbal cues only. Pt neg ramp & curb with cane with supervision.  Pt amb 25' without device except ASO with supervision.  Gait Velocity with Cane 1.83 ft/sec and with no device 1.57 ft/sec   03/12/2023: Distance walked: 100' Assistive device utilized: Environmental consultant - 2 wheeled Level of assistance: SBA Comments: pt arrived to PT  using RW & CAM boot with almost full weight bearing including taking 2 steps away from RW (no UE support)   TODAY'S TREATMENT DATE: 04/28/2023: Therapeutic Exercise: Recumbent bike seat 7 level 1 8 min Step up & down on BOSU round side up with BLEs 10 reps with BUE supoort Gastroc stretch on step heel depression 2x30 sec; Lt soleus stretch on incline board 2x30 sec Heel raises BLE concentric & LLE only eccentric 10 reps Walking up / down ramp 3 reps ea: forward facing, side step to right and side step to left.    Neuromuscular Re-education: LLE SLS 5 reps up to 2 sec SLS rolling tennis ball ant/post, med/lat & circles CW/CCW BLEs 10 reps ea with intermittent touch with LLE SLS Stepping  LLE onto foam beam SLS stepping RLE on/off 8" step 5 reps BUE support, 3 reps RUE support & 3 reps LUE support Sidestepping on foam beam transitioning firm to/from compliant surface 15 reps leading with RLE & 15 reps leading with LLE Braiding on floor 3 laps in //bars with light UE support Rockerboard with single pivot so has to control other plane of motion - ant/post and lateral x 2 min each with BUE support //bars    04/23/2023: Therapeutic Exercise: Leg Press BLEs 87# 15 reps;  LLE only 43# 15 reps 2 sets Leg press machine heel raise BLEs 87# 15 reps; LLE only 37# 10 reps Incline board on step heel depression 2x30 sec; Lt soleus stretch on incline board 2x30 sec Heel raises BLE concentric & LLE only eccentric 10 reps Standing on foam pad hip abduction alternating LEs 2x10; L3 band with light single UE support needed Standing on foam pad hip extension alternating LEs 2x10; L3 band with single light UE support needed   Neuromuscular Re-education: LLE SLS 5 reps up to 2 sec Stepping LLE onto foam beam SLS tapping RLE to 8" step 15 reps Sidestepping on foam beam transitioning firm to/from compliant surface 15 reps leading with RLE & 15 reps leading with LLE Braiding on floor 3 laps in //bars with  light UE support Rockerboard with single pivot so has to control other plane of motion - ant/post and lateral x 2 min each with BUE support //bars  Therapeutic Activities: Pt amb in clinic without device and without ASO safely. Walking up / down ramp 3 reps ea: forward facing, side step to right and side step to left.  PT reviewed recommendation in home no device or ASO; in community for paved surfaces no ASO but cane; in community with uneven terrain cane & ASO.     04/21/2023: Therapeutic Exercise: Leg Press BLEs 87# 15 reps;  LLE only 43# 10 reps 2 sets Incline board gastroc stretch straddle step 2x30 sec; Lt soleus stretch on incline board 2x30 sec Heel raises on incline board BLEs x 15 reps; light touch for balance;  BLE concentric & LLE only eccentric 10 reps Standing on foam pad hip abduction 2x10; L3 band with light UE support needed Standing on foam pad hip extension 2x10; L3 band with light UE support needed   Neuromuscular Re-education: LLE SLS 5 reps up to 2 sec Tandem gait on foam beam including transition on/off foam forward LUE support x 3 laps in // bars Sidestepping on foam beam including transition on/off foam x 3 laps in // bars; intermittent UE support Braiding on floor 3 laps in //bars without touching Rockerboard with single pivot so has to control other plane of motion - ant/post and lateral x 2 min each with BUE light support //bars  Therapeutic Activities: Pt amb in clinic without device and without ASO safely. Walking up / down ramp 3 reps ea: forward facing, side step to right and side step to left.  PT recommending in home no device or ASO; in community for paved surfaces no ASO but cane; in community with uneven terrain cane & ASO. Pt verbalized understanding.     PATIENT EDUCATION:  Education details: gait, HEP, POC Person educated: Patient Education method: Explanation, Demonstration, Verbal cues, and Handouts Education comprehension: verbalized  understanding, returned demonstration, and verbal cues required  HOME EXERCISE PROGRAM: Access Code: [email or web address] URL: [email or web address] Date: 04/10/2023 Prepared by: Vladimir Faster  Exercises - Seated Calf Stretch with Strap  -  3 x daily - 7 x weekly - 1 sets - 3-5 reps - 30 seconds hold - Seated Soleus Stretch with Strap  - 3 x daily - 7 x weekly - 1 sets - 3-5 reps - 30 seconds hold - standing calf stretch with forefoot on small step or brick  - 2 x daily - 7 x weekly - 1 sets - 3 reps - 30 seconds hold - Standing Soleus Stretch on Foam 1/2 Roll  - 2 x daily - 7 x weekly - 1 sets - 3 reps - 30 seconds hold - Seated Plantar Fascia Mobilization with Small Ball  - 1 x daily - 5 x weekly - 1 sets - 10 reps - 5 seconds hold - Seated Ankle Plantarflexion with Resistance  - 1 x daily - 7 x weekly - 2 sets - 10 reps - 5 seconds hold - Seated Ankle Inversion with Anchored Resistance  - 1 x daily - 7 x weekly - 2 sets - 10 reps - 5 seconds hold - Seated Ankle Eversion with Anchored Resistance  - 1 x daily - 7 x weekly - 2 sets - 10 reps - 5 seconds hold - Long Sitting Ankle Dorsiflexion with Anchored Resistance  - 1 x daily - 7 x weekly - 2 sets - 10 reps - 5 seconds hold - Standing Tandem Balance with Counter Support  - 1 x daily - 7 x weekly - 1 sets - 2 reps - 30 seconds hold - Standing Gastroc Stretch at Counter  - 2 x daily - 7 x weekly - 1 sets - 3 reps - 30 seconds hold - Standing Soleus Stretch  - 2 x daily - 7 x weekly - 1 sets - 3 reps - 30 seconds hold - Heel raises near counter  - 2 x daily - 7 x weekly - 2 sets - 10 reps - 5 seconds hold - Standing Toe Raises at Chair  - 2 x daily - 7 x weekly - 2 sets - 10 reps - 5 seconds hold - Tandem Stance  - 1 x daily - 7 x weekly - 1 sets - 2 reps - 30 seconds hold - Wide stance on Foam Pad head movements  - 1 x daily - 7 x weekly - 2 sets - 10 reps Hand written  -inversion & eversion stretch standing with lateral & medial foot on  towel - 30 sec 3 reps 2x/day  ASSESSMENT: CLINICAL IMPRESSION: Patient seems to tolerate standing with more time & weight on LLE. 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    OBJECTIVE IMPAIRMENTS: Abnormal gait, decreased activity tolerance, decreased balance, decreased endurance, decreased knowledge of condition, decreased knowledge of use of DME, decreased mobility, difficulty walking, decreased ROM, decreased strength, increased edema, impaired flexibility, postural dysfunction, obesity, and pain.   ACTIVITY LIMITATIONS: carrying, lifting, bending, standing, squatting, sleeping, stairs, transfers, and locomotion level  PARTICIPATION LIMITATIONS: meal prep, cleaning, laundry, driving, community activity, and occupation  PERSONAL FACTORS: Age, Fitness, Time since onset of injury/illness/exacerbation, and 3+ comorbidities: see PMH  are also affecting patient's functional outcome.   REHAB POTENTIAL: Good  CLINICAL DECISION MAKING: Stable/uncomplicated  EVALUATION COMPLEXITY: Low   GOALS: Goals reviewed with patient? Yes  SHORT TERM GOALS: (target date for Short term goals 04/11/2023)   1.  Patient will demonstrate independent use of home exercise program to maintain progress from in clinic treatments. Baseline: See objective data Goal status:  MET  04/10/2023  2. PROM left ankle DF 0* Baseline: See objective data Goal status: MET 04/23/2023  3. Interim FOTO >45% Baseline: See objective data Goal status: MET  04/10/2023   LONG TERM GOALS: (target dates for all long term goals  06/06/2023 )   1. Patient will demonstrate/report pain at worst less than or equal to 2/10 to facilitate minimal limitation in daily activity secondary to pain symptoms. Baseline: See Objective data Goal status: Ongoing 04/23/2023   2. Patient will demonstrate independent use of home exercise program to facilitate ability to maintain/progress functional gains from skilled  physical therapy services. Baseline: See Objective data Goal status: Ongoing 04/23/2023   3. Patient will demonstrate FOTO outcome > or = 64 % to indicate reduced disability due to condition. Baseline: See Objective data Goal status: Ongoing 04/23/2023   4.  Patient will demonstrate left ankle MMT >/= 4/5 to faciltiate usual transfers, stairs, squatting at [facility] for daily life.  Baseline: See Objective data Goal status: Ongoing 04/23/2023   5.  Left ankle AROM DF >15* and PF >45* Baseline: See Objective data Goal status: Ongoing 04/23/2023   6.  patient ambulates without assistive device >1000' and negotiates ramps, curbs & stairs single rail independently.  Baseline: See Objective data Goal status: Ongoing 04/23/2023    PLAN:  PT FREQUENCY:  2x/week  PT DURATION: 12 weeks  PLANNED INTERVENTIONS: Therapeutic exercises, Therapeutic activity, Neuro Muscular re-education, Balance training, Gait training, Patient/Family education, Joint mobilization, Stair training, DME instructions, Dry Needling, Electrical stimulation, Traction, Cryotherapy, vasopneumatic deviceMoist heat, Taping, Ultrasound, Ionotophoresis 4mg /ml Dexamethasone, and aquatic therapy, Manual therapy.  All included unless contraindicated  PLAN FOR NEXT SESSION: continue to progress standing exercises and balance activities. vaso to end session prn.   Vladimir Faster, PT, DPT 04/28/2023, 9:44 AM

## 2023-04-30 ENCOUNTER — Encounter: Payer: Self-pay | Admitting: Physical Therapy

## 2023-04-30 ENCOUNTER — Ambulatory Visit (INDEPENDENT_AMBULATORY_CARE_PROVIDER_SITE_OTHER): Payer: Medicare Other | Admitting: Physical Therapy

## 2023-04-30 DIAGNOSIS — M25672 Stiffness of left ankle, not elsewhere classified: Secondary | ICD-10-CM

## 2023-04-30 DIAGNOSIS — M25572 Pain in left ankle and joints of left foot: Secondary | ICD-10-CM | POA: Diagnosis not present

## 2023-04-30 DIAGNOSIS — M6281 Muscle weakness (generalized): Secondary | ICD-10-CM | POA: Diagnosis not present

## 2023-04-30 DIAGNOSIS — R2681 Unsteadiness on feet: Secondary | ICD-10-CM

## 2023-04-30 DIAGNOSIS — R600 Localized edema: Secondary | ICD-10-CM

## 2023-04-30 DIAGNOSIS — R2689 Other abnormalities of gait and mobility: Secondary | ICD-10-CM

## 2023-04-30 NOTE — Therapy (Signed)
OUTPATIENT PHYSICAL THERAPY TREATMENT  Patient Name: Holly Arellano MRN: [id number] DOB:[birth year], 67 y.o., female Today's Date: 04/30/2023     END OF SESSION:  PT End of Session - 04/30/23 1151     Visit Number 15    Number of Visits 25    Date for PT Re-Evaluation 06/06/23    Authorization Type [employer]BS Medicare    Authorization Time Period $10 copay    Progress Note Due on Visit 18    PT Start Time 1145    PT Stop Time 1225    PT Time Calculation (min) 40 min    Activity Tolerance Patient tolerated treatment well;Patient limited by pain    Behavior During Therapy [facility] for tasks assessed/performed                         Past Medical History:  Diagnosis Date   Chronic maxillary sinusitis 12/15/2007   Qualifier: Diagnosis of  By: Lovell Sheehan MD, John E    Cough variant asthma 10/19/2008   Qualifier: Diagnosis of  By: Lovell Sheehan MD, John E    GERD (gastroesophageal reflux disease)    HEMORRHOIDS, INTERNAL 12/15/2007   Qualifier: Diagnosis of  By: Mayford Knife, [name], Domenic Polite    HIATAL HERNIA 12/15/2007   Qualifier: Diagnosis of  By: Mayford Knife, [name], [name]    Hypertension    MAMMOGRAM, ABNORMAL 08/01/2008   Qualifier: Diagnosis of  By: Mayford Knife [name], Domenic Polite    RAYNAUD'S DISEASE 12/15/2007   Qualifier: Diagnosis of  By: Mayford Knife [name], Domenic Polite    Past Surgical History:  Procedure Laterality Date   CHOLECYSTECTOMY     KNEE SURGERY  1994   ORIF ANKLE FRACTURE Left 02/05/2023   Procedure: OPEN REDUCTION INTERNAL FIXATION (ORIF) LEFT ANKLE FRACTURE W/ SCREWS AND PLATES;  Surgeon: Nadara Mustard, MD;  Location: [facility] OR;  Service: Orthopedics;  Laterality: Left;   trauma to aerm in machine   1996   Patient Active Problem List   Diagnosis Date Noted   Closed bimalleolar fracture of left ankle 02/05/2023   Vitamin D deficiency 04/25/2020   IGT (impaired glucose tolerance) 04/25/2020   Obesity, unspecified 05/18/2014   Hyperlipemia 05/18/2014   Hyperglycemia 05/18/2014    Allergic rhinitis 12/15/2007   GERD 12/15/2007    PCP: Chaya Jan, MD  REFERRING PROVIDER: Barnie Del, NP  REFERRING DIAG: [phone] (ICD-10-CM) - Closed bimalleolar fracture of left ankle   THERAPY DIAG:  Muscle weakness (generalized)  Pain in left ankle and joints of left foot  Localized edema  Stiffness of left ankle, not elsewhere classified  Unsteadiness on feet  Other abnormalities of gait and mobility  Rationale for Evaluation and Treatment: Rehabilitation  ONSET DATE: 02/05/2023 surgery ORIF  SUBJECTIVE:   SUBJECTIVE STATEMENT: She has been walking without ASO some.   PERTINENT HISTORY: Left ankle bimalleolar fx with ORIF 02/05/2023, asthma, HTN, Raynaud's disease, knee surgery left 1990's  PAIN:  NPRS scale: today 0/10 and since last PT no pain Pain location: left ankle posterior, foot forefoot & arch Pain description: throbbing Aggravating factors: standing & walking Relieving factors: tylenol, elevation & compression sock  PRECAUTIONS: Fall  WEIGHT BEARING RESTRICTIONS: Yes 03/17/2023 office note LLE WBAT in ASO  FALLS:  Has patient fallen in last 6 months? Yes. 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   Next MD visit:  04/10/2023  OBJECTIVE:  DIAGNOSTIC FINDINGS: 03/04/2023 Radiographs of the left ankle show stable alignment of fixation hardware for bimalleolar ankle fracture.  No complicating feature.   PATIENT SURVEYS:  04/10/2023  visit 9  47%  03/12/2023 / Eval: FOTO intake:  38%  predicted:  64%  COGNITION: 03/12/2023  Overall  cognitive status: WFL    SENSATION: WFL  EDEMA:  03/12/2023 Circumferential:  LLE: above malleoli  26.5 cm,  around ankle heel to dorsum 36.4 cm, midfoot 27.6 cm RLE: above malleoli  22 cm,  around ankle heel to dorsum 32.1 cm, midfoot 24.6 cm  POSTURE: 03/12/2023 rounded shoulders, forward head, flexed trunk , and weight shift right  PALPATION: 03/12/2023  Tenderness medial & lateral ankle; tenderness forefoot dorsum & plantar surface  LOWER EXTREMITY ROM:   ROM Left eval Left  03/28/23 Left 04/08/23 Left 04/23/23  Ankle dorsiflexion Supine  Knee ext A: -22* P: -19* Knee flex A: -15* P: -13* Supine  Knee ext A: -15* P: -8* Seated Knee ext A: -13* P: -5* Knee flexed A: -11* P: - 3* Seated Knee ext A: -8* P: -1* Knee flexed A: -1* P: +2*  Ankle plantarflexion Supine A: 25* P: 29* Supine A: 38* P: 45* Seated  A: 40* P: 45*   Ankle inversion Supine P: 2* Supine P: 16*    Ankle eversion Supine  P: 1* Supine P: 10*     (Blank rows = not tested)  LOWER EXTREMITY MMT:  MMT Left eval Left 04/08/23  Ankle dorsiflexion 3-/5 3+ within available ROM  Ankle plantarflexion 3-/5 seated 3+ within available ROM  Ankle inversion 3-/5 3+ within available ROM  Ankle eversion 3-/5 3+ within available ROM   (Blank rows = not tested)  FUNCTIONAL TESTS:  04/28/2023: SLS RLE: 8.51 sec  LLE 2.83 sec  04/08/2023: SLS RLE: 7.41 sec  LLE 1.33 sec   18 inch chair transfer: requires use of armrests to arise.  Needed PT cues to limit LLE weight bearing per MD office visit 04/03/23: TUG 22.8 sec, SPC   GAIT: 04/08/2023:  Pt amb >150' with cane with verbal cues only. Pt neg ramp & curb with cane with supervision.  Pt amb 25' without device except ASO with supervision.  Gait Velocity with Cane 1.83 ft/sec and with no device 1.57 ft/sec   03/12/2023: Distance walked: 100' Assistive device utilized: Environmental consultant - 2 wheeled Level of assistance: SBA Comments: pt arrived to PT  using RW & CAM boot with almost full weight bearing including taking 2 steps away from RW (no UE support)   TODAY'S TREATMENT DATE: 04/28/2023: Therapeutic Exercise: Recumbent bike seat 7 level 1 8 min Step up & down on BOSU round side up with BLEs 10 reps with BUE supoort Gastroc stretch on step heel depression 2x30 sec; Heel raises BLE concentric & LLE only eccentric 10 reps with BUE support on mirror (vertical support)+ Walking up / down ramp 3 reps ea: forward facing, side step to right and side step to left.    Neuromuscular Re-education: SLS rolling tennis ball ant/post, med/lat & circles CW/CCW BLEs 10 reps ea with intermittent touch with LLE SLS Stepping LLE onto foam beam SLS stepping RLE on/off  8" step 5 reps BUE support, 3 reps RUE support & 3 reps LUE support Sidestepping on foam beam transitioning firm to/from compliant surface 15 reps leading with RLE & 15 reps leading with LLE Tandem gait on foam beam with LLE stepping onto beam intermittent touch //bars 3 laps Braiding on floor 3 laps in //bars without UE support Rockerboard with single pivot so has to control other plane of motion - ant/post and lateral x 2 min each with BUE light support //bars    04/23/2023: Therapeutic Exercise: Leg Press BLEs 87# 15 reps;  LLE only 43# 15 reps 2 sets Leg press machine heel raise BLEs 87# 15 reps; LLE only 37# 10 reps Incline board on step heel depression 2x30 sec; Lt soleus stretch on incline board 2x30 sec Heel raises BLE concentric & LLE only eccentric 10 reps Standing on foam pad hip abduction alternating LEs 2x10; L3 band with light single UE support needed Standing on foam pad hip extension alternating LEs 2x10; L3 band with single light UE support needed   Neuromuscular Re-education: LLE SLS 5 reps up to 2 sec Stepping LLE onto foam beam SLS tapping RLE to 8" step 15 reps Sidestepping on foam beam transitioning firm to/from compliant surface 15 reps leading with RLE & 15  reps leading with LLE Braiding on floor 3 laps in //bars with light UE support Rockerboard with single pivot so has to control other plane of motion - ant/post and lateral x 2 min each with BUE support //bars  Therapeutic Activities: Pt amb in clinic without device and without ASO safely. Walking up / down ramp 3 reps ea: forward facing, side step to right and side step to left.  PT reviewed recommendation in home no device or ASO; in community for paved surfaces no ASO but cane; in community with uneven terrain cane & ASO.     04/21/2023: Therapeutic Exercise: Leg Press BLEs 87# 15 reps;  LLE only 43# 10 reps 2 sets Incline board gastroc stretch straddle step 2x30 sec; Lt soleus stretch on incline board 2x30 sec Heel raises on incline board BLEs x 15 reps; light touch for balance;  BLE concentric & LLE only eccentric 10 reps Standing on foam pad hip abduction 2x10; L3 band with light UE support needed Standing on foam pad hip extension 2x10; L3 band with light UE support needed   Neuromuscular Re-education: LLE SLS 5 reps up to 2 sec Tandem gait on foam beam including transition on/off foam forward LUE support x 3 laps in // bars Sidestepping on foam beam including transition on/off foam x 3 laps in // bars; intermittent UE support Braiding on floor 3 laps in //bars without touching Rockerboard with single pivot so has to control other plane of motion - ant/post and lateral x 2 min each with BUE light support //bars  Therapeutic Activities: Pt amb in clinic without device and without ASO safely. Walking up / down ramp 3 reps ea: forward facing, side step to right and side step to left.  PT recommending in home no device or ASO; in community for paved surfaces no ASO but cane; in community with uneven terrain cane & ASO. Pt verbalized understanding.     PATIENT EDUCATION:  Education details: gait, HEP, POC Person educated: Patient Education method: Explanation, Demonstration,  Verbal cues, and Handouts Education comprehension: verbalized understanding, returned demonstration, and verbal cues required  HOME EXERCISE PROGRAM: Access Code: [email or web address] URL: [email or web address] Date: 04/10/2023 Prepared by: Vladimir Faster  Exercises -  Seated Calf Stretch with Strap  - 3 x daily - 7 x weekly - 1 sets - 3-5 reps - 30 seconds hold - Seated Soleus Stretch with Strap  - 3 x daily - 7 x weekly - 1 sets - 3-5 reps - 30 seconds hold - standing calf stretch with forefoot on small step or brick  - 2 x daily - 7 x weekly - 1 sets - 3 reps - 30 seconds hold - Standing Soleus Stretch on Foam 1/2 Roll  - 2 x daily - 7 x weekly - 1 sets - 3 reps - 30 seconds hold - Seated Plantar Fascia Mobilization with Small Ball  - 1 x daily - 5 x weekly - 1 sets - 10 reps - 5 seconds hold - Seated Ankle Plantarflexion with Resistance  - 1 x daily - 7 x weekly - 2 sets - 10 reps - 5 seconds hold - Seated Ankle Inversion with Anchored Resistance  - 1 x daily - 7 x weekly - 2 sets - 10 reps - 5 seconds hold - Seated Ankle Eversion with Anchored Resistance  - 1 x daily - 7 x weekly - 2 sets - 10 reps - 5 seconds hold - Long Sitting Ankle Dorsiflexion with Anchored Resistance  - 1 x daily - 7 x weekly - 2 sets - 10 reps - 5 seconds hold - Standing Tandem Balance with Counter Support  - 1 x daily - 7 x weekly - 1 sets - 2 reps - 30 seconds hold - Standing Gastroc Stretch at Counter  - 2 x daily - 7 x weekly - 1 sets - 3 reps - 30 seconds hold - Standing Soleus Stretch  - 2 x daily - 7 x weekly - 1 sets - 3 reps - 30 seconds hold - Heel raises near counter  - 2 x daily - 7 x weekly - 2 sets - 10 reps - 5 seconds hold - Standing Toe Raises at Chair  - 2 x daily - 7 x weekly - 2 sets - 10 reps - 5 seconds hold - Tandem Stance  - 1 x daily - 7 x weekly - 1 sets - 2 reps - 30 seconds hold - Wide stance on Foam Pad head movements  - 1 x daily - 7 x weekly - 2 sets - 10 reps Hand written   -inversion & eversion stretch standing with lateral & medial foot on towel - 30 sec 3 reps 2x/day  ASSESSMENT: CLINICAL IMPRESSION: Patient improved control of pronation & supination with balance activities.  She is going back to work next week and appears able to manage intermittent walking at work.     OBJECTIVE IMPAIRMENTS: Abnormal gait, decreased activity tolerance, decreased balance, decreased endurance, decreased knowledge of condition, decreased knowledge of use of DME, decreased mobility, difficulty walking, decreased ROM, decreased strength, increased edema, impaired flexibility, postural dysfunction, obesity, and pain.   ACTIVITY LIMITATIONS: carrying, lifting, bending, standing, squatting, sleeping, stairs, transfers, and locomotion level  PARTICIPATION LIMITATIONS: meal prep, cleaning, laundry, driving, community activity, and occupation  PERSONAL FACTORS: Age, Fitness, Time since onset of injury/illness/exacerbation, and 3+ comorbidities: see PMH  are also affecting patient's functional outcome.   REHAB POTENTIAL: Good  CLINICAL DECISION MAKING: Stable/uncomplicated  EVALUATION COMPLEXITY: Low   GOALS: Goals reviewed with patient? Yes  SHORT TERM GOALS: (target date for Short term goals 04/11/2023)   1.  Patient will demonstrate independent use of home exercise program to maintain progress from  in clinic treatments. Baseline: See objective data Goal status: MET  04/10/2023  2. PROM left ankle DF 0* Baseline: See objective data Goal status: MET 04/23/2023  3. Interim FOTO >45% Baseline: See objective data Goal status: MET  04/10/2023   LONG TERM GOALS: (target dates for all long term goals  06/06/2023 )   1. Patient will demonstrate/report pain at worst less than or equal to 2/10 to facilitate minimal limitation in daily activity secondary to pain symptoms. Baseline: See Objective data Goal status: Ongoing 04/23/2023   2. Patient will demonstrate independent use  of home exercise program to facilitate ability to maintain/progress functional gains from skilled physical therapy services. Baseline: See Objective data Goal status: Ongoing 04/23/2023   3. Patient will demonstrate FOTO outcome > or = 64 % to indicate reduced disability due to condition. Baseline: See Objective data Goal status: Ongoing 04/23/2023   4.  Patient will demonstrate left ankle MMT >/= 4/5 to faciltiate usual transfers, stairs, squatting at [facility] for daily life.  Baseline: See Objective data Goal status: Ongoing 04/23/2023   5.  Left ankle AROM DF >15* and PF >45* Baseline: See Objective data Goal status: Ongoing 04/23/2023   6.  patient ambulates without assistive device >1000' and negotiates ramps, curbs & stairs single rail independently.  Baseline: See Objective data Goal status: Ongoing 04/23/2023    PLAN:  PT FREQUENCY:  2x/week  PT DURATION: 12 weeks  PLANNED INTERVENTIONS: Therapeutic exercises, Therapeutic activity, Neuro Muscular re-education, Balance training, Gait training, Patient/Family education, Joint mobilization, Stair training, DME instructions, Dry Needling, Electrical stimulation, Traction, Cryotherapy, vasopneumatic deviceMoist heat, Taping, Ultrasound, Ionotophoresis 4mg /ml Dexamethasone, and aquatic therapy, Manual therapy.  All included unless contraindicated  PLAN FOR NEXT SESSION: reduce frequency to 1x/wk for 3 weeks with PT checking / addressing any issues with return to work, update HEP, continue to progress standing exercises and balance activities. vaso to end session prn.   Vladimir Faster, PT, DPT 04/30/2023, 12:29 PM

## 2023-05-05 ENCOUNTER — Encounter: Payer: Medicare Other | Admitting: Physical Therapy

## 2023-05-07 ENCOUNTER — Encounter: Payer: Medicare Other | Admitting: Physical Therapy

## 2023-05-08 ENCOUNTER — Encounter: Payer: Self-pay | Admitting: Physical Therapy

## 2023-05-08 ENCOUNTER — Ambulatory Visit: Payer: Medicare Other | Admitting: Physical Therapy

## 2023-05-08 DIAGNOSIS — M6281 Muscle weakness (generalized): Secondary | ICD-10-CM | POA: Diagnosis not present

## 2023-05-08 DIAGNOSIS — M25572 Pain in left ankle and joints of left foot: Secondary | ICD-10-CM

## 2023-05-08 DIAGNOSIS — R600 Localized edema: Secondary | ICD-10-CM | POA: Diagnosis not present

## 2023-05-08 DIAGNOSIS — M25672 Stiffness of left ankle, not elsewhere classified: Secondary | ICD-10-CM

## 2023-05-08 DIAGNOSIS — R2681 Unsteadiness on feet: Secondary | ICD-10-CM | POA: Diagnosis not present

## 2023-05-08 DIAGNOSIS — R2689 Other abnormalities of gait and mobility: Secondary | ICD-10-CM

## 2023-05-08 NOTE — Therapy (Signed)
OUTPATIENT PHYSICAL THERAPY TREATMENT  Patient Name: Holly Arellano MRN: [id number] DOB:Apr 10, 1956, 67 y.o., female Today's Date: 05/08/2023     END OF SESSION:  PT End of Session - 05/08/23 1143     Visit Number 16    Number of Visits 25    Date for PT Re-Evaluation 06/06/23    Authorization Type BCBS Medicare    Authorization Time Period $10 copay    Progress Note Due on Visit 18    PT Start Time 1143    PT Stop Time 1215    PT Time Calculation (min) 32 min    Activity Tolerance Patient tolerated treatment well;Patient limited by pain    Behavior During Therapy [facility] for tasks assessed/performed                          Past Medical History:  Diagnosis Date   Chronic maxillary sinusitis 12/15/2007   Qualifier: Diagnosis of  By: Lovell Sheehan MD, John E    Cough variant asthma 10/19/2008   Qualifier: Diagnosis of  By: Lovell Sheehan MD, John E    GERD (gastroesophageal reflux disease)    HEMORRHOIDS, INTERNAL 12/15/2007   Qualifier: Diagnosis of  By: Mayford Knife, [name], Domenic Polite    HIATAL HERNIA 12/15/2007   Qualifier: Diagnosis of  By: Mayford Knife, [name], [name]    Hypertension    MAMMOGRAM, ABNORMAL 08/01/2008   Qualifier: Diagnosis of  By: Mayford Knife [name], Domenic Polite    RAYNAUD'S DISEASE 12/15/2007   Qualifier: Diagnosis of  By: Mayford Knife [name], Domenic Polite    Past Surgical History:  Procedure Laterality Date   CHOLECYSTECTOMY     KNEE SURGERY  1994   ORIF ANKLE FRACTURE Left 02/05/2023   Procedure: OPEN REDUCTION INTERNAL FIXATION (ORIF) LEFT ANKLE FRACTURE W/ SCREWS AND PLATES;  Surgeon: Nadara Mustard, MD;  Location: [facility] OR;  Service: Orthopedics;  Laterality: Left;   trauma to aerm in machine   1996   Patient Active Problem List   Diagnosis Date Noted   Closed bimalleolar fracture of left ankle 02/05/2023   Vitamin D deficiency 04/25/2020   IGT (impaired glucose tolerance) 04/25/2020   Obesity, unspecified 05/18/2014   Hyperlipemia 05/18/2014   Hyperglycemia 05/18/2014    Allergic rhinitis 12/15/2007   GERD 12/15/2007    PCP: Chaya Jan, MD  REFERRING PROVIDER: Barnie Del, NP  REFERRING DIAG: [id number] (ICD-10-CM) - Closed bimalleolar fracture of left ankle   THERAPY DIAG:  Muscle weakness (generalized)  Pain in left ankle and joints of left foot  Localized edema  Unsteadiness on feet  Stiffness of left ankle, not elsewhere classified  Other abnormalities of gait and mobility  Rationale for Evaluation and Treatment: Rehabilitation  ONSET DATE: 02/05/2023 surgery ORIF  SUBJECTIVE:   SUBJECTIVE STATEMENT: She went back to work for last 4 days and worked 4 hours. She is really fatigued after work.  The pain is same but more up on lower leg, top of foot and on inside. She has not gotten to her exercises this week.    PERTINENT HISTORY: Left ankle bimalleolar fx with ORIF 02/05/2023, asthma, HTN, Raynaud's disease, knee surgery left 1990's  PAIN:  NPRS scale: today 3/10 Pain location: left ankle posterior, foot forefoot & arch Pain description: throbbing Aggravating factors: standing & walking Relieving factors: tylenol, elevation & compression sock  PRECAUTIONS: Fall  WEIGHT BEARING RESTRICTIONS: Yes 03/17/2023 office note LLE WBAT in ASO  FALLS:  Has patient fallen in last 6  months? Yes. 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   Next MD visit:  04/10/2023  OBJECTIVE:  DIAGNOSTIC FINDINGS: 03/04/2023 Radiographs of the left ankle show stable alignment of fixation hardware for bimalleolar ankle fracture.  No complicating  feature.   PATIENT SURVEYS:  04/10/2023  visit 9  47%  03/12/2023 / Eval: FOTO intake:  38%  predicted:  64%  COGNITION: 03/12/2023  Overall cognitive status: WFL    SENSATION: WFL  EDEMA:  03/12/2023 Circumferential:  LLE: above malleoli  26.5 cm,  around ankle heel to dorsum 36.4 cm, midfoot 27.6 cm RLE: above malleoli  22 cm,  around ankle heel to dorsum 32.1 cm, midfoot 24.6 cm  POSTURE: 03/12/2023 rounded shoulders, forward head, flexed trunk , and weight shift right  PALPATION: 03/12/2023  Tenderness medial & lateral ankle; tenderness forefoot dorsum & plantar surface  LOWER EXTREMITY ROM:   ROM Left eval Left  03/28/23 Left 04/08/23 Left 04/23/23  Ankle dorsiflexion Supine  Knee ext A: -22* P: -19* Knee flex A: -15* P: -13* Supine  Knee ext A: -15* P: -8* Seated Knee ext A: -13* P: -5* Knee flexed A: -11* P: - 3* Seated Knee ext A: -8* P: -1* Knee flexed A: -1* P: +2*  Ankle plantarflexion Supine A: 25* P: 29* Supine A: 38* P: 45* Seated  A: 40* P: 45*   Ankle inversion Supine P: 2* Supine P: 16*    Ankle eversion Supine  P: 1* Supine P: 10*     (Blank rows = not tested)  LOWER EXTREMITY MMT:  MMT Left eval Left 04/08/23  Ankle dorsiflexion 3-/5 3+ within available ROM  Ankle plantarflexion 3-/5 seated 3+ within available ROM  Ankle inversion 3-/5 3+ within available ROM  Ankle eversion 3-/5 3+ within available ROM   (Blank rows = not tested)  FUNCTIONAL TESTS:  04/28/2023: SLS RLE: 8.51 sec  LLE 2.83 sec  04/08/2023: SLS RLE: 7.41 sec  LLE 1.33 sec   18 inch chair transfer: requires use of armrests to arise.  Needed PT cues to limit LLE weight bearing per MD office visit 04/03/23: TUG 22.8 sec, SPC   GAIT: 04/08/2023:  Pt amb >150' with cane with verbal cues only. Pt neg ramp & curb with cane with supervision.  Pt amb 25' without device except ASO with supervision.  Gait Velocity with Cane 1.83 ft/sec and with no device 1.57  ft/sec   03/12/2023: Distance walked: 100' Assistive device utilized: Environmental consultant - 2 wheeled Level of assistance: SBA Comments: pt arrived to PT using RW & CAM boot with almost full weight bearing including taking 2 steps away from RW (no UE support)   TODAY'S TREATMENT DATE: 05/08/2023: Therapeutic Exercise: Recumbent bike seat 7 level 1 8 min Gastroc stretch on step heel depression 30 sec hold 3 reps per LE Inversion & eversion stretch with lateral & medial foot on folded towel with weight bearing 30 sec hold 3 reps for both motions PT reviewed above stretches and reinforced need for ankle flexibility to prevent quick stretches and enable proper motion for gait & other activities.  She needs to try to stretches daily and strengthening / balance exercises every other day. Pt verbalized understanding. \  Self-care: PT discussed use of NSAIDs for pain & swelling prior & after work, then weaning down to prn. Pt verbalized understanding. PT recommended standing to walk some (even if only across room & back) every 30 min at work to decrease stiffness and edema. Continue to use ASO at work for now. No ASO on firm surfaces but may need ASO if going on grass or uneven terrain. Pt verbalized understanding.   04/28/2023: Therapeutic Exercise: Recumbent bike seat 7 level 1 8 min Step up & down on BOSU round side up with BLEs 10 reps with BUE supoort Gastroc stretch on step heel depression 2x30 sec; Heel raises BLE concentric & LLE only eccentric 10 reps with BUE support on mirror (vertical support)+ Walking up / down ramp 3 reps ea: forward facing, side step to right and side step to left.    Neuromuscular Re-education: SLS rolling tennis ball ant/post, med/lat & circles CW/CCW BLEs 10 reps ea with intermittent touch with LLE SLS Stepping LLE onto foam beam SLS stepping RLE on/off 8" step 5 reps BUE support, 3 reps RUE support & 3 reps LUE support Sidestepping on foam beam transitioning firm to/from  compliant surface 15 reps leading with RLE & 15 reps leading with LLE Tandem gait on foam beam with LLE stepping onto beam intermittent touch //bars 3 laps Braiding on floor 3 laps in //bars without UE support Rockerboard with single pivot so has to control other plane of motion - ant/post and lateral x 2 min each with BUE light support //bars    04/23/2023: Therapeutic Exercise: Leg Press BLEs 87# 15 reps;  LLE only 43# 15 reps 2 sets Leg press machine heel raise BLEs 87# 15 reps; LLE only 37# 10 reps Incline board on step heel depression 2x30 sec; Lt soleus stretch on incline board 2x30 sec Heel raises BLE concentric & LLE only eccentric 10 reps Standing on foam pad hip abduction alternating LEs 2x10; L3 band with light single UE support needed Standing on foam pad hip extension alternating LEs 2x10; L3 band with single light UE support needed   Neuromuscular Re-education: LLE SLS 5 reps up to 2 sec Stepping LLE onto foam beam SLS tapping RLE to 8" step 15 reps Sidestepping on foam beam transitioning firm to/from compliant surface 15 reps leading with RLE & 15 reps leading with LLE Braiding on floor 3 laps in //bars with light UE support Rockerboard with single pivot so has to control other plane of motion - ant/post and lateral x 2 min each with BUE support //bars  Therapeutic Activities: Pt amb in clinic without device and without ASO safely. Walking up / down ramp 3 reps ea: forward facing, side step to right and side step to left.  PT reviewed recommendation in home no device or ASO; in community for paved surfaces no ASO but cane; in community with uneven terrain cane & ASO.     PATIENT EDUCATION:  Education details: gait, HEP, POC Person educated: Patient Education method: Explanation, Demonstration, Verbal cues, and Handouts Education comprehension: verbalized understanding, returned demonstration, and verbal cues required  HOME EXERCISE PROGRAM: Access Code:  [email or web address] URL: [email or web address] Date: 04/10/2023 Prepared by: Vladimir Faster  Exercises - Seated Calf Stretch with Strap  - 3 x daily - 7 x weekly - 1 sets - 3-5 reps - 30 seconds hold - Seated Soleus Stretch with Strap  -  3 x daily - 7 x weekly - 1 sets - 3-5 reps - 30 seconds hold - standing calf stretch with forefoot on small step or brick  - 2 x daily - 7 x weekly - 1 sets - 3 reps - 30 seconds hold - Standing Soleus Stretch on Foam 1/2 Roll  - 2 x daily - 7 x weekly - 1 sets - 3 reps - 30 seconds hold - Seated Plantar Fascia Mobilization with Small Ball  - 1 x daily - 5 x weekly - 1 sets - 10 reps - 5 seconds hold - Seated Ankle Plantarflexion with Resistance  - 1 x daily - 7 x weekly - 2 sets - 10 reps - 5 seconds hold - Seated Ankle Inversion with Anchored Resistance  - 1 x daily - 7 x weekly - 2 sets - 10 reps - 5 seconds hold - Seated Ankle Eversion with Anchored Resistance  - 1 x daily - 7 x weekly - 2 sets - 10 reps - 5 seconds hold - Long Sitting Ankle Dorsiflexion with Anchored Resistance  - 1 x daily - 7 x weekly - 2 sets - 10 reps - 5 seconds hold - Standing Tandem Balance with Counter Support  - 1 x daily - 7 x weekly - 1 sets - 2 reps - 30 seconds hold - Standing Gastroc Stretch at Counter  - 2 x daily - 7 x weekly - 1 sets - 3 reps - 30 seconds hold - Standing Soleus Stretch  - 2 x daily - 7 x weekly - 1 sets - 3 reps - 30 seconds hold - Heel raises near counter  - 2 x daily - 7 x weekly - 2 sets - 10 reps - 5 seconds hold - Standing Toe Raises at Chair  - 2 x daily - 7 x weekly - 2 sets - 10 reps - 5 seconds hold - Tandem Stance  - 1 x daily - 7 x weekly - 1 sets - 2 reps - 30 seconds hold - Wide stance on Foam Pad head movements  - 1 x daily - 7 x weekly - 2 sets - 10 reps Hand written  -inversion & eversion stretch standing with lateral & medial foot on towel - 30 sec 3 reps 2x/day  ASSESSMENT: CLINICAL IMPRESSION: Patient appears to understand need  for compliance with HEP for ankle range, strength & balance.    OBJECTIVE IMPAIRMENTS: Abnormal gait, decreased activity tolerance, decreased balance, decreased endurance, decreased knowledge of condition, decreased knowledge of use of DME, decreased mobility, difficulty walking, decreased ROM, decreased strength, increased edema, impaired flexibility, postural dysfunction, obesity, and pain.   ACTIVITY LIMITATIONS: carrying, lifting, bending, standing, squatting, sleeping, stairs, transfers, and locomotion level  PARTICIPATION LIMITATIONS: meal prep, cleaning, laundry, driving, community activity, and occupation  PERSONAL FACTORS: Age, Fitness, Time since onset of injury/illness/exacerbation, and 3+ comorbidities: see PMH  are also affecting patient's functional outcome.   REHAB POTENTIAL: Good  CLINICAL DECISION MAKING: Stable/uncomplicated  EVALUATION COMPLEXITY: Low   GOALS: Goals reviewed with patient? Yes  SHORT TERM GOALS: (target date for Short term goals 04/11/2023)   1.  Patient will demonstrate independent use of home exercise program to maintain progress from in clinic treatments. Baseline: See objective data Goal status: MET  04/10/2023  2. PROM left ankle DF 0* Baseline: See objective data Goal status: MET 04/23/2023  3. Interim FOTO >45% Baseline: See objective data Goal status: MET  04/10/2023   LONG TERM GOALS: (  target dates for all long term goals  06/06/2023 )   1. Patient will demonstrate/report pain at worst less than or equal to 2/10 to facilitate minimal limitation in daily activity secondary to pain symptoms. Baseline: See Objective data Goal status: Ongoing 04/23/2023   2. Patient will demonstrate independent use of home exercise program to facilitate ability to maintain/progress functional gains from skilled physical therapy services. Baseline: See Objective data Goal status: Ongoing 04/23/2023   3. Patient will demonstrate FOTO outcome > or = 64 % to  indicate reduced disability due to condition. Baseline: See Objective data Goal status: Ongoing 04/23/2023   4.  Patient will demonstrate left ankle MMT >/= 4/5 to faciltiate usual transfers, stairs, squatting at [facility] for daily life.  Baseline: See Objective data Goal status: Ongoing 04/23/2023   5.  Left ankle AROM DF >15* and PF >45* Baseline: See Objective data Goal status: Ongoing 04/23/2023   6.  patient ambulates without assistive device >1000' and negotiates ramps, curbs & stairs single rail independently.  Baseline: See Objective data Goal status: Ongoing 04/23/2023    PLAN:  PT FREQUENCY:  2x/week  PT DURATION: 12 weeks  PLANNED INTERVENTIONS: Therapeutic exercises, Therapeutic activity, Neuro Muscular re-education, Balance training, Gait training, Patient/Family education, Joint mobilization, Stair training, DME instructions, Dry Needling, Electrical stimulation, Traction, Cryotherapy, vasopneumatic deviceMoist heat, Taping, Ultrasound, Ionotophoresis 4mg /ml Dexamethasone, and aquatic therapy, Manual therapy.  All included unless contraindicated  PLAN FOR NEXT SESSION:   frequency 1x/wk for 2 more weeks with PT checking / addressing any issues with return to work, continue to progress standing exercises and balance activities prn   Vladimir Faster, PT, DPT 05/08/2023, 12:22 PM

## 2023-05-13 ENCOUNTER — Encounter: Payer: Medicare Other | Admitting: Physical Therapy

## 2023-05-21 ENCOUNTER — Encounter: Payer: Self-pay | Admitting: Physical Therapy

## 2023-05-21 ENCOUNTER — Ambulatory Visit: Payer: Medicare Other | Admitting: Physical Therapy

## 2023-05-21 DIAGNOSIS — R600 Localized edema: Secondary | ICD-10-CM

## 2023-05-21 DIAGNOSIS — M25572 Pain in left ankle and joints of left foot: Secondary | ICD-10-CM

## 2023-05-21 DIAGNOSIS — R2689 Other abnormalities of gait and mobility: Secondary | ICD-10-CM

## 2023-05-21 DIAGNOSIS — M25672 Stiffness of left ankle, not elsewhere classified: Secondary | ICD-10-CM

## 2023-05-21 DIAGNOSIS — M6281 Muscle weakness (generalized): Secondary | ICD-10-CM | POA: Diagnosis not present

## 2023-05-21 DIAGNOSIS — R2681 Unsteadiness on feet: Secondary | ICD-10-CM

## 2023-05-21 NOTE — Therapy (Signed)
OUTPATIENT PHYSICAL THERAPY TREATMENT  Patient Name: Holly Arellano MRN: [id number] DOB:1956-05-14, 67 y.o., female Today's Date: 05/21/2023     END OF SESSION:  PT End of Session - 05/21/23 1345     Visit Number 17    Number of Visits 25    Date for PT Re-Evaluation 06/06/23    Authorization Type BCBS Medicare    Authorization Time Period $10 copay    Progress Note Due on Visit 18    PT Start Time 1345    PT Stop Time 1411    PT Time Calculation (min) 26 min    Activity Tolerance Patient tolerated treatment well;Patient limited by pain    Behavior During Therapy [facility] for tasks assessed/performed                           Past Medical History:  Diagnosis Date   Chronic maxillary sinusitis 12/15/2007   Qualifier: Diagnosis of  By: Lovell Sheehan MD, John E    Cough variant asthma 10/19/2008   Qualifier: Diagnosis of  By: Lovell Sheehan MD, John E    GERD (gastroesophageal reflux disease)    HEMORRHOIDS, INTERNAL 12/15/2007   Qualifier: Diagnosis of  By: Mayford Knife, [name], Domenic Polite    HIATAL HERNIA 12/15/2007   Qualifier: Diagnosis of  By: Mayford Knife, [name], [name]    Hypertension    MAMMOGRAM, ABNORMAL 08/01/2008   Qualifier: Diagnosis of  By: Mayford Knife [name], Domenic Polite    RAYNAUD'S DISEASE 12/15/2007   Qualifier: Diagnosis of  By: Mayford Knife [name], Domenic Polite    Past Surgical History:  Procedure Laterality Date   CHOLECYSTECTOMY     KNEE SURGERY  1994   ORIF ANKLE FRACTURE Left 02/05/2023   Procedure: OPEN REDUCTION INTERNAL FIXATION (ORIF) LEFT ANKLE FRACTURE W/ SCREWS AND PLATES;  Surgeon: Nadara Mustard, MD;  Location: [facility] OR;  Service: Orthopedics;  Laterality: Left;   trauma to aerm in machine   1996   Patient Active Problem List   Diagnosis Date Noted   Closed bimalleolar fracture of left ankle 02/05/2023   Vitamin D deficiency 04/25/2020   IGT (impaired glucose tolerance) 04/25/2020   Obesity, unspecified 05/18/2014   Hyperlipemia 05/18/2014   Hyperglycemia 05/18/2014    Allergic rhinitis 12/15/2007   GERD 12/15/2007    PCP: Chaya Jan, MD  REFERRING PROVIDER: Barnie Del, NP  REFERRING DIAG: [phone] (ICD-10-CM) - Closed bimalleolar fracture of left ankle   THERAPY DIAG:  Muscle weakness (generalized)  Localized edema  Pain in left ankle and joints of left foot  Unsteadiness on feet  Stiffness of left ankle, not elsewhere classified  Other abnormalities of gait and mobility  Rationale for Evaluation and Treatment: Rehabilitation  ONSET DATE: 02/05/2023 surgery ORIF  SUBJECTIVE:   SUBJECTIVE STATEMENT: She did not wear ASO yesterday or today at work. She is cautious but no issues. She has not vacuumed or cleaned tub.  She has trouble with stairs if >3 or more going down.  The edema seems better.    PERTINENT HISTORY: Left ankle bimalleolar fx with ORIF 02/05/2023, asthma, HTN, Raynaud's disease, knee surgery left 1990's  PAIN:  NPRS scale: today 0/10 and in last week up to 1.5/10 Pain location: left ankle posterior, foot forefoot & arch Pain description: throbbing Aggravating factors: standing & walking Relieving factors: tylenol, elevation & compression sock  PRECAUTIONS: Fall  WEIGHT BEARING RESTRICTIONS: Yes 03/17/2023 office note LLE WBAT in ASO  FALLS:  Has patient fallen  in last 6 months? Yes. 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   Next MD visit:  04/10/2023  OBJECTIVE:  DIAGNOSTIC FINDINGS: 03/04/2023 Radiographs of the left ankle show stable alignment of fixation hardware for bimalleolar ankle fracture.   No complicating feature.   PATIENT SURVEYS:  04/10/2023  visit 9  47%  03/12/2023 / Eval: FOTO intake:  38%  predicted:  64%  COGNITION: 03/12/2023  Overall cognitive status: WFL    SENSATION: WFL  EDEMA:  03/12/2023 Circumferential:  LLE: above malleoli  26.5 cm,  around ankle heel to dorsum 36.4 cm, midfoot 27.6 cm RLE: above malleoli  22 cm,  around ankle heel to dorsum 32.1 cm, midfoot 24.6 cm  POSTURE: 03/12/2023 rounded shoulders, forward head, flexed trunk , and weight shift right  PALPATION: 03/12/2023  Tenderness medial & lateral ankle; tenderness forefoot dorsum & plantar surface  LOWER EXTREMITY ROM:   ROM Left eval Left  03/28/23 Left 04/08/23 Left 04/23/23  Ankle dorsiflexion Supine  Knee ext A: -22* P: -19* Knee flex A: -15* P: -13* Supine  Knee ext A: -15* P: -8* Seated Knee ext A: -13* P: -5* Knee flexed A: -11* P: - 3* Seated Knee ext A: -8* P: -1* Knee flexed A: -1* P: +2*  Ankle plantarflexion Supine A: 25* P: 29* Supine A: 38* P: 45* Seated  A: 40* P: 45*   Ankle inversion Supine P: 2* Supine P: 16*    Ankle eversion Supine  P: 1* Supine P: 10*     (Blank rows = not tested)  LOWER EXTREMITY MMT:  MMT Left eval Left 04/08/23  Ankle dorsiflexion 3-/5 3+ within available ROM  Ankle plantarflexion 3-/5 seated 3+ within available ROM  Ankle inversion 3-/5 3+ within available ROM  Ankle eversion 3-/5 3+ within available ROM   (Blank rows = not tested)  FUNCTIONAL TESTS:  04/28/2023: SLS RLE: 8.51 sec  LLE 2.83 sec  04/08/2023: SLS RLE: 7.41 sec  LLE 1.33 sec   18 inch chair transfer: requires use of armrests to arise.  Needed PT cues to limit LLE weight bearing per MD office visit 04/03/23: TUG 22.8 sec, SPC   GAIT: 04/08/2023:  Pt amb >150' with cane with verbal cues only. Pt neg ramp & curb with cane with supervision.  Pt amb 25' without device except ASO with supervision.  Gait Velocity with Cane 1.83 ft/sec and with no  device 1.57 ft/sec   03/12/2023: Distance walked: 100' Assistive device utilized: Environmental consultant - 2 wheeled Level of assistance: SBA Comments: pt arrived to PT using RW & CAM boot with almost full weight bearing including taking 2 steps away from RW (no UE support)   TODAY'S TREATMENT DATE: 05/21/2023: Therapeutic Exercise: Recumbent bike seat 7 level 1 8 min  Self-care: PT demo & verbal cues on vacuuming by placing left foot forward and weight shifting bw LEs.  Turn left foot to face area that you are cleaning.  Pt able to return demo simulated and verbalized understanding.  PT demo & verbal cues on transferring to/from floor via half kneeling with LLE up.  Kneeling on cushion to protect knees. Pt able to return demo pushing with BUEs on chair seat and verbalized understanding.   Gait Training: PT demo & verbal cues on stairs: descending with step-to pattern - right rail & cane LUE 3 steps, left rail & cane RUE 3 steps leading with LLE; 2 rails leading with RLE so LLE eccentric control with distal half of foot over edge to compensate for ankle tightness.   Ascend with left rail and right cane alternating pattern.   PT recommended continuing with no ASO unless walking longer distances (like a soccer field to go watch grandchild). Pt verbalized understanding.     05/08/2023: Therapeutic Exercise: Recumbent bike seat 7 level 1 8 min Gastroc stretch on step heel depression 30 sec hold 3 reps per LE Inversion & eversion stretch with lateral & medial foot on folded towel with weight bearing 30 sec hold 3 reps for both motions PT reviewed above stretches and reinforced need for ankle flexibility to prevent quick stretches and enable proper motion for gait & other activities. She needs to try to stretches daily and strengthening / balance exercises every other day. Pt verbalized understanding. \  Self-care: PT discussed use of NSAIDs for pain & swelling prior & after work, then weaning down to prn.  Pt verbalized understanding. PT recommended standing to walk some (even if only across room & back) every 30 min at work to decrease stiffness and edema. Continue to use ASO at work for now. No ASO on firm surfaces but may need ASO if going on grass or uneven terrain. Pt verbalized understanding.   04/28/2023: Therapeutic Exercise: Recumbent bike seat 7 level 1 8 min Step up & down on BOSU round side up with BLEs 10 reps with BUE supoort Gastroc stretch on step heel depression 2x30 sec; Heel raises BLE concentric & LLE only eccentric 10 reps with BUE support on mirror (vertical support)+ Walking up / down ramp 3 reps ea: forward facing, side step to right and side step to left.    Neuromuscular Re-education: SLS rolling tennis ball ant/post, med/lat & circles CW/CCW BLEs 10 reps ea with intermittent touch with LLE SLS Stepping LLE onto foam beam SLS stepping RLE on/off 8" step 5 reps BUE support, 3 reps RUE support & 3 reps LUE support Sidestepping on foam beam transitioning firm to/from compliant surface 15 reps leading with RLE & 15 reps leading with LLE Tandem gait on foam beam with LLE stepping onto beam intermittent touch //bars 3 laps Braiding on floor 3 laps in //bars without UE support Rockerboard with single pivot so has to control other plane of motion - ant/post and lateral x 2 min each with BUE light support //bars    PATIENT EDUCATION:  Education details: gait, HEP, POC Person educated: Patient Education method: Explanation, Demonstration, Verbal cues, and Handouts Education comprehension: verbalized understanding, returned demonstration, and verbal cues required  HOME EXERCISE PROGRAM: Access Code: [email or web address] URL: [email or web address] Date: 04/10/2023 Prepared by: Vladimir Faster  Exercises - Seated Calf Stretch with Strap  - 3 x daily - 7 x weekly - 1 sets - 3-5 reps - 30 seconds hold - Seated Soleus Stretch with Strap  - 3 x daily - 7 x weekly - 1 sets -  3-5 reps - 30 seconds hold - standing calf stretch with forefoot on small step or brick  - 2 x daily - 7 x weekly - 1 sets - 3 reps - 30 seconds hold - Standing Soleus Stretch  on Foam 1/2 Roll  - 2 x daily - 7 x weekly - 1 sets - 3 reps - 30 seconds hold - Seated Plantar Fascia Mobilization with Small Ball  - 1 x daily - 5 x weekly - 1 sets - 10 reps - 5 seconds hold - Seated Ankle Plantarflexion with Resistance  - 1 x daily - 7 x weekly - 2 sets - 10 reps - 5 seconds hold - Seated Ankle Inversion with Anchored Resistance  - 1 x daily - 7 x weekly - 2 sets - 10 reps - 5 seconds hold - Seated Ankle Eversion with Anchored Resistance  - 1 x daily - 7 x weekly - 2 sets - 10 reps - 5 seconds hold - Long Sitting Ankle Dorsiflexion with Anchored Resistance  - 1 x daily - 7 x weekly - 2 sets - 10 reps - 5 seconds hold - Standing Tandem Balance with Counter Support  - 1 x daily - 7 x weekly - 1 sets - 2 reps - 30 seconds hold - Standing Gastroc Stretch at Counter  - 2 x daily - 7 x weekly - 1 sets - 3 reps - 30 seconds hold - Standing Soleus Stretch  - 2 x daily - 7 x weekly - 1 sets - 3 reps - 30 seconds hold - Heel raises near counter  - 2 x daily - 7 x weekly - 2 sets - 10 reps - 5 seconds hold - Standing Toe Raises at Chair  - 2 x daily - 7 x weekly - 2 sets - 10 reps - 5 seconds hold - Tandem Stance  - 1 x daily - 7 x weekly - 1 sets - 2 reps - 30 seconds hold - Wide stance on Foam Pad head movements  - 1 x daily - 7 x weekly - 2 sets - 10 reps Hand written  -inversion & eversion stretch standing with lateral & medial foot on towel - 30 sec 3 reps 2x/day  ASSESSMENT: CLINICAL IMPRESSION: Patient reports better understanding of activities that she was still struggling with.  She appears will be ready for discharge next week at PT visit.   OBJECTIVE IMPAIRMENTS: Abnormal gait, decreased activity tolerance, decreased balance, decreased endurance, decreased knowledge of condition, decreased knowledge  of use of DME, decreased mobility, difficulty walking, decreased ROM, decreased strength, increased edema, impaired flexibility, postural dysfunction, obesity, and pain.   ACTIVITY LIMITATIONS: carrying, lifting, bending, standing, squatting, sleeping, stairs, transfers, and locomotion level  PARTICIPATION LIMITATIONS: meal prep, cleaning, laundry, driving, community activity, and occupation  PERSONAL FACTORS: Age, Fitness, Time since onset of injury/illness/exacerbation, and 3+ comorbidities: see PMH  are also affecting patient's functional outcome.   REHAB POTENTIAL: Good  CLINICAL DECISION MAKING: Stable/uncomplicated  EVALUATION COMPLEXITY: Low   GOALS: Goals reviewed with patient? Yes  SHORT TERM GOALS: (target date for Short term goals 04/11/2023)   1.  Patient will demonstrate independent use of home exercise program to maintain progress from in clinic treatments. Baseline: See objective data Goal status: MET  04/10/2023  2. PROM left ankle DF 0* Baseline: See objective data Goal status: MET 04/23/2023  3. Interim FOTO >45% Baseline: See objective data Goal status: MET  04/10/2023   LONG TERM GOALS: (target dates for all long term goals  06/06/2023 )   1. Patient will demonstrate/report pain at worst less than or equal to 2/10 to facilitate minimal limitation in daily activity secondary to pain symptoms. Baseline: See Objective data Goal status:  Ongoing 04/23/2023   2. Patient will demonstrate independent use of home exercise program to facilitate ability to maintain/progress functional gains from skilled physical therapy services. Baseline: See Objective data Goal status: Ongoing 04/23/2023   3. Patient will demonstrate FOTO outcome > or = 64 % to indicate reduced disability due to condition. Baseline: See Objective data Goal status: Ongoing 04/23/2023   4.  Patient will demonstrate left ankle MMT >/= 4/5 to faciltiate usual transfers, stairs, squatting at [facility] for  daily life.  Baseline: See Objective data Goal status: Ongoing 04/23/2023   5.  Left ankle AROM DF >15* and PF >45* Baseline: See Objective data Goal status: Ongoing 04/23/2023   6.  patient ambulates without assistive device >1000' and negotiates ramps, curbs & stairs single rail independently.  Baseline: See Objective data Goal status: Ongoing 04/23/2023    PLAN:  PT FREQUENCY:  2x/week  PT DURATION: 12 weeks  PLANNED INTERVENTIONS: Therapeutic exercises, Therapeutic activity, Neuro Muscular re-education, Balance training, Gait training, Patient/Family education, Joint mobilization, Stair training, DME instructions, Dry Needling, Electrical stimulation, Traction, Cryotherapy, vasopneumatic deviceMoist heat, Taping, Ultrasound, Ionotophoresis 4mg /ml Dexamethasone, and aquatic therapy, Manual therapy.  All included unless contraindicated  PLAN FOR NEXT SESSION:   assess LTGs and discharge, address any issues.     Vladimir Faster, PT, DPT 05/21/2023, 2:21 PM

## 2023-05-28 ENCOUNTER — Encounter: Payer: Self-pay | Admitting: Physical Therapy

## 2023-05-28 ENCOUNTER — Ambulatory Visit: Payer: Medicare Other | Admitting: Physical Therapy

## 2023-05-28 DIAGNOSIS — M25672 Stiffness of left ankle, not elsewhere classified: Secondary | ICD-10-CM

## 2023-05-28 DIAGNOSIS — R2681 Unsteadiness on feet: Secondary | ICD-10-CM

## 2023-05-28 DIAGNOSIS — M6281 Muscle weakness (generalized): Secondary | ICD-10-CM | POA: Diagnosis not present

## 2023-05-28 DIAGNOSIS — M25572 Pain in left ankle and joints of left foot: Secondary | ICD-10-CM

## 2023-05-28 DIAGNOSIS — R600 Localized edema: Secondary | ICD-10-CM

## 2023-05-28 DIAGNOSIS — R2689 Other abnormalities of gait and mobility: Secondary | ICD-10-CM

## 2023-05-28 NOTE — Therapy (Signed)
OUTPATIENT PHYSICAL THERAPY TREATMENT  Patient Name: Holly Arellano MRN: [id number] DOB:1956-06-22, 67 y.o., female Today's Date: 05/28/2023   PHYSICAL THERAPY DISCHARGE SUMMARY  Visits from Start of Care: 18  Current functional level related to goals / functional outcomes: See below   Remaining deficits: See assessment statement below   Education / Equipment: Patient was educated in HEP which she appears to understand.    Patient agrees to discharge. Patient goals were partially met. Patient is being discharged due to meeting the stated rehab goals.   END OF SESSION:  PT End of Session - 05/28/23 1300     Visit Number 18    Number of Visits 25    Date for PT Re-Evaluation 06/06/23    Authorization Type [employer]BS Medicare    Authorization Time Period $10 copay    Progress Note Due on Visit 18    PT Start Time 1300    PT Stop Time 1325    PT Time Calculation (min) 25 min    Activity Tolerance Patient tolerated treatment well;Patient limited by pain    Behavior During Therapy [facility] for tasks assessed/performed                            Past Medical History:  Diagnosis Date   Chronic maxillary sinusitis 12/15/2007   Qualifier: Diagnosis of  By: Lovell Sheehan MD, John E    Cough variant asthma 10/19/2008   Qualifier: Diagnosis of  By: Lovell Sheehan MD, John E    GERD (gastroesophageal reflux disease)    HEMORRHOIDS, INTERNAL 12/15/2007   Qualifier: Diagnosis of  By: Mayford Knife, [name], Domenic Polite    HIATAL HERNIA 12/15/2007   Qualifier: Diagnosis of  By: Mayford Knife, [name], [name]    Hypertension    MAMMOGRAM, ABNORMAL 08/01/2008   Qualifier: Diagnosis of  By: Mayford Knife [name], Domenic Polite    RAYNAUD'S DISEASE 12/15/2007   Qualifier: Diagnosis of  By: Mayford Knife [name], Domenic Polite    Past Surgical History:  Procedure Laterality Date   CHOLECYSTECTOMY     KNEE SURGERY  1994   ORIF ANKLE FRACTURE Left 02/05/2023   Procedure: OPEN REDUCTION INTERNAL FIXATION (ORIF) LEFT ANKLE FRACTURE W/  SCREWS AND PLATES;  Surgeon: Nadara Mustard, MD;  Location: [facility] OR;  Service: Orthopedics;  Laterality: Left;   trauma to aerm in machine   1996   Patient Active Problem List   Diagnosis Date Noted   Closed bimalleolar fracture of left ankle 02/05/2023   Vitamin D deficiency 04/25/2020   IGT (impaired glucose tolerance) 04/25/2020   Obesity, unspecified 05/18/2014   Hyperlipemia 05/18/2014   Hyperglycemia 05/18/2014   Allergic rhinitis 12/15/2007   GERD 12/15/2007    PCP: Chaya Jan, MD  REFERRING PROVIDER: Barnie Del, NP  REFERRING DIAG: [phone] (ICD-10-CM) - Closed bimalleolar fracture of left ankle   THERAPY DIAG:  Muscle weakness (generalized)  Localized edema  Pain in left ankle and joints of left foot  Unsteadiness on feet  Stiffness of left ankle, not elsewhere classified  Other abnormalities of gait and mobility  Rationale for Evaluation and Treatment: Rehabilitation  ONSET DATE: 02/05/2023 surgery ORIF  SUBJECTIVE:   SUBJECTIVE STATEMENT: It has been a rough week as she shampooed her rug Friday & Saturday.  Her ankle was tender & swollen on inside yesterday morning.   PERTINENT HISTORY: Left ankle bimalleolar fx with ORIF 02/05/2023, asthma, HTN, Raynaud's disease, knee surgery left 1990's  PAIN:  NPRS scale:  today  0/10 and in last week up to 4/10 after shampooing rug, prior to that activity ranging 0/10 - 1.5/10 Pain location: left ankle posterior, foot forefoot & arch Pain description: throbbing Aggravating factors: standing & walking Relieving factors: tylenol, elevation & compression sock  PRECAUTIONS: Fall  WEIGHT BEARING RESTRICTIONS: Yes 03/17/2023 office note LLE WBAT in ASO  FALLS:  Has patient fallen in last 6 months? Yes. 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   Next MD visit:  04/10/2023  OBJECTIVE:  DIAGNOSTIC FINDINGS: 03/04/2023 Radiographs of the left ankle show stable alignment of fixation hardware for bimalleolar ankle fracture.  No complicating feature.   PATIENT SURVEYS:  05/28/2023 visit 18 55%  04/10/2023  visit 9  47%  03/12/2023 / Eval: FOTO intake:  38%  predicted:  64%  COGNITION: 03/12/2023  Overall cognitive status: WFL    SENSATION: WFL  EDEMA:  03/12/2023 Circumferential:  LLE: above malleoli  26.5 cm,  around ankle heel to dorsum 36.4 cm, midfoot 27.6 cm RLE: above malleoli  22 cm,  around ankle heel to dorsum 32.1 cm, midfoot 24.6 cm  POSTURE: 03/12/2023 rounded shoulders, forward head, flexed trunk , and weight shift right  PALPATION: 03/12/2023  Tenderness medial & lateral ankle; tenderness forefoot dorsum & plantar surface  LOWER EXTREMITY ROM:   ROM Left eval Left  03/28/23 Left 04/08/23 Left 04/23/23 Left  05/28/23  Ankle dorsiflexion Supine  Knee ext A: -22* P: -19* Knee flex A: -15* P: -13* Supine  Knee ext A: -15* P: -8* Seated Knee ext A: -13* P: -5* Knee flexed A: -11* P: - 3* Seated Knee ext A: -8* P: -1* Knee flexed A: -1* P: +2* Seated Knee ext A: +1* P: +5*  Ankle plantarflexion Supine A: 25* P: 29* Supine A: 38* P: 45* Seated  A: 40* P: 45*  Seated  A: 40* P: 45*  Ankle inversion Supine P: 2* Supine P: 16*     Ankle eversion Supine  P: 1* Supine P: 10*      (Blank rows = not tested)  LOWER EXTREMITY MMT:  MMT Left eval Left 04/08/23 Left 05/28/23  Ankle dorsiflexion 3-/5 3+ within available ROM 4/5  Ankle plantarflexion 3-/5 seated 3+ within available ROM Seated  4/5 Standing unable to raise up in SLS  Ankle inversion 3-/5  3+ within available ROM 4/5  Ankle eversion 3-/5 3+ within available ROM 4/5   (Blank rows = not tested)  FUNCTIONAL TESTS:  04/28/2023: SLS RLE: 8.51 sec  LLE 2.83 sec  04/08/2023: SLS RLE: 7.41 sec  LLE 1.33 sec   18 inch chair transfer: requires use of armrests to arise.  Needed PT cues to limit LLE weight bearing per MD office visit 04/03/23: TUG 22.8 sec, SPC   GAIT: 05/28/2023: Pt amb >1000' with cane modified independent and 300' without device modified independent.  Pt neg ramps & curbs without device modified independent.  Pt neg stairs with one rail independently. Gait Velocity with no device 2.74 ft/sec comfortable / self-selected pace and 3.88 ft/se fast pace.  04/08/2023:  Pt amb >  150' with cane with verbal cues only. Pt neg ramp & curb with cane with supervision.  Pt amb 25' without device except ASO with supervision.  Gait Velocity with Cane 1.83 ft/sec and with no device 1.57 ft/sec   03/12/2023: Distance walked: 100' Assistive device utilized: Environmental consultant - 2 wheeled Level of assistance: SBA Comments: pt arrived to PT using RW & CAM boot with almost full weight bearing including taking 2 steps away from RW (no UE support)   TODAY'S TREATMENT DATE: 05/28/2023 See objective data Pt verbalizes understanding of HEP including rationale to continue.  05/21/2023: Therapeutic Exercise: Recumbent bike seat 7 level 1 8 min  Self-care: PT demo & verbal cues on vacuuming by placing left foot forward and weight shifting bw LEs.  Turn left foot to face area that you are cleaning.  Pt able to return demo simulated and verbalized understanding.  PT demo & verbal cues on transferring to/from floor via half kneeling with LLE up. Kneeling on cushion to protect knees. Pt able to return demo pushing with BUEs on chair seat and verbalized understanding.   Gait Training: PT demo & verbal cues on stairs: descending with step-to pattern - right rail & cane LUE 3 steps, left rail & cane  RUE 3 steps leading with LLE; 2 rails leading with RLE so LLE eccentric control with distal half of foot over edge to compensate for ankle tightness.   Ascend with left rail and right cane alternating pattern.   PT recommended continuing with no ASO unless walking longer distances (like a soccer field to go watch grandchild). Pt verbalized understanding.     05/08/2023: Therapeutic Exercise: Recumbent bike seat 7 level 1 8 min Gastroc stretch on step heel depression 30 sec hold 3 reps per LE Inversion & eversion stretch with lateral & medial foot on folded towel with weight bearing 30 sec hold 3 reps for both motions PT reviewed above stretches and reinforced need for ankle flexibility to prevent quick stretches and enable proper motion for gait & other activities. She needs to try to stretches daily and strengthening / balance exercises every other day. Pt verbalized understanding. \  Self-care: PT discussed use of NSAIDs for pain & swelling prior & after work, then weaning down to prn. Pt verbalized understanding. PT recommended standing to walk some (even if only across room & back) every 30 min at work to decrease stiffness and edema. Continue to use ASO at work for now. No ASO on firm surfaces but may need ASO if going on grass or uneven terrain. Pt verbalized understanding.     PATIENT EDUCATION:  Education details: gait, HEP, POC Person educated: Patient Education method: Explanation, Demonstration, Verbal cues, and Handouts Education comprehension: verbalized understanding, returned demonstration, and verbal cues required  HOME EXERCISE PROGRAM: Access Code: [email or web address] URL: [email or web address] Date: 04/10/2023 Prepared by: Vladimir Faster  Exercises - Seated Calf Stretch with Strap  - 3 x daily - 7 x weekly - 1 sets - 3-5 reps - 30 seconds hold - Seated Soleus Stretch with Strap  - 3 x daily - 7 x weekly - 1 sets - 3-5 reps - 30 seconds hold - standing calf stretch  with forefoot on small step or brick  - 2 x daily - 7 x weekly - 1 sets - 3 reps - 30 seconds hold - Standing Soleus Stretch on Foam 1/2 Roll  - 2 x daily - 7 x weekly - 1 sets - 3 reps - 30 seconds hold -  Seated Plantar Fascia Mobilization with Small Ball  - 1 x daily - 5 x weekly - 1 sets - 10 reps - 5 seconds hold - Seated Ankle Plantarflexion with Resistance  - 1 x daily - 7 x weekly - 2 sets - 10 reps - 5 seconds hold - Seated Ankle Inversion with Anchored Resistance  - 1 x daily - 7 x weekly - 2 sets - 10 reps - 5 seconds hold - Seated Ankle Eversion with Anchored Resistance  - 1 x daily - 7 x weekly - 2 sets - 10 reps - 5 seconds hold - Long Sitting Ankle Dorsiflexion with Anchored Resistance  - 1 x daily - 7 x weekly - 2 sets - 10 reps - 5 seconds hold - Standing Tandem Balance with Counter Support  - 1 x daily - 7 x weekly - 1 sets - 2 reps - 30 seconds hold - Standing Gastroc Stretch at Counter  - 2 x daily - 7 x weekly - 1 sets - 3 reps - 30 seconds hold - Standing Soleus Stretch  - 2 x daily - 7 x weekly - 1 sets - 3 reps - 30 seconds hold - Heel raises near counter  - 2 x daily - 7 x weekly - 2 sets - 10 reps - 5 seconds hold - Standing Toe Raises at Chair  - 2 x daily - 7 x weekly - 2 sets - 10 reps - 5 seconds hold - Tandem Stance  - 1 x daily - 7 x weekly - 1 sets - 2 reps - 30 seconds hold - Wide stance on Foam Pad head movements  - 1 x daily - 7 x weekly - 2 sets - 10 reps Hand written  -inversion & eversion stretch standing with lateral & medial foot on towel - 30 sec 3 reps 2x/day  ASSESSMENT: CLINICAL IMPRESSION: patient appears to be functioning in community and has returned to work.  Her ankle pain increased after heavy housework of shampooing carpet but otherwise her pain has been 1.5/10 or less.     OBJECTIVE IMPAIRMENTS: Abnormal gait, decreased activity tolerance, decreased balance, decreased endurance, decreased knowledge of condition, decreased knowledge of use of  DME, decreased mobility, difficulty walking, decreased ROM, decreased strength, increased edema, impaired flexibility, postural dysfunction, obesity, and pain.   ACTIVITY LIMITATIONS: carrying, lifting, bending, standing, squatting, sleeping, stairs, transfers, and locomotion level  PARTICIPATION LIMITATIONS: meal prep, cleaning, laundry, driving, community activity, and occupation  PERSONAL FACTORS: Age, Fitness, Time since onset of injury/illness/exacerbation, and 3+ comorbidities: see PMH  are also affecting patient's functional outcome.   REHAB POTENTIAL: Good  CLINICAL DECISION MAKING: Stable/uncomplicated  EVALUATION COMPLEXITY: Low   GOALS: Goals reviewed with patient? Yes  SHORT TERM GOALS: (target date for Short term goals 04/11/2023)   1.  Patient will demonstrate independent use of home exercise program to maintain progress from in clinic treatments. Baseline: See objective data Goal status: MET  04/10/2023  2. PROM left ankle DF 0* Baseline: See objective data Goal status: MET 04/23/2023  3. Interim FOTO >45% Baseline: See objective data Goal status: MET  04/10/2023   LONG TERM GOALS: (target dates for all long term goals  06/06/2023 )   1. Patient will demonstrate/report pain at worst less than or equal to 2/10 to facilitate minimal limitation in daily activity secondary to pain symptoms. Baseline: See Objective data Goal status: partially met 05/28/2023   2. Patient will demonstrate independent use of home exercise program  to facilitate ability to maintain/progress functional gains from skilled physical therapy services. Baseline: See Objective data Goal status: MET 05/28/2023   3. Patient will demonstrate FOTO outcome > or = 64 % to indicate reduced disability due to condition. Baseline: See Objective data Goal status: partially met 05/28/2023   4.  Patient will demonstrate left ankle MMT >/= 4/5 to faciltiate usual transfers, stairs, squatting at [facility] for daily  life.  Baseline: See Objective data Goal status: partially met 05/28/2023   5.  Left ankle AROM DF >5* and PF >45* Baseline: See Objective data Goal status: partially met 05/28/2023   6.  patient ambulates without assistive device >1000' and negotiates ramps, curbs & stairs single rail independently.  Baseline: See Objective data Goal status: partially met 05/28/2023    PLAN:  PT FREQUENCY:  2x/week  PT DURATION: 12 weeks  PLANNED INTERVENTIONS: Therapeutic exercises, Therapeutic activity, Neuro Muscular re-education, Balance training, Gait training, Patient/Family education, Joint mobilization, Stair training, DME instructions, Dry Needling, Electrical stimulation, Traction, Cryotherapy, vasopneumatic deviceMoist heat, Taping, Ultrasound, Ionotophoresis 4mg /ml Dexamethasone, and aquatic therapy, Manual therapy.  All included unless contraindicated  PLAN FOR NEXT SESSION:  discharge PT   Vladimir Faster, PT, DPT 05/28/2023, 1:32 PM

## 2023-07-29 ENCOUNTER — Ambulatory Visit (INDEPENDENT_AMBULATORY_CARE_PROVIDER_SITE_OTHER): Payer: Medicare Other | Admitting: Internal Medicine

## 2023-07-29 ENCOUNTER — Encounter: Payer: Self-pay | Admitting: Internal Medicine

## 2023-07-29 VITALS — BP 130/84 | HR 88 | Temp 98.0°F | Wt 211.9 lb

## 2023-07-29 DIAGNOSIS — I10 Essential (primary) hypertension: Secondary | ICD-10-CM

## 2023-07-29 DIAGNOSIS — R7302 Impaired glucose tolerance (oral): Secondary | ICD-10-CM | POA: Diagnosis not present

## 2023-07-29 DIAGNOSIS — E782 Mixed hyperlipidemia: Secondary | ICD-10-CM

## 2023-07-29 DIAGNOSIS — Z135 Encounter for screening for eye and ear disorders: Secondary | ICD-10-CM | POA: Diagnosis not present

## 2023-07-29 LAB — POCT GLYCOSYLATED HEMOGLOBIN (HGB A1C): Hemoglobin A1C: 6.1 % — AB (ref 4.0–5.6)

## 2023-07-29 MED ORDER — ATORVASTATIN CALCIUM 20 MG PO TABS: 20.0000 mg | ORAL_TABLET | Freq: Every day | ORAL | 1 refills | Status: DC

## 2023-07-29 MED ORDER — AMLODIPINE BESYLATE 5 MG PO TABS: 5.0000 mg | ORAL_TABLET | Freq: Every day | ORAL | 1 refills | Status: DC

## 2023-07-29 MED ORDER — VALSARTAN-HYDROCHLOROTHIAZIDE 160-25 MG PO TABS
1.0000 | ORAL_TABLET | Freq: Every day | ORAL | 1 refills | Status: DC
Start: 2023-07-29 — End: 2024-01-26

## 2023-07-29 NOTE — Assessment & Plan Note (Signed)
Not well-controlled on current.  Continue amlodipine 5 mg, add Diovan HCT 160/25 mg.  Continue ambulatory blood pressure measurements and return in 3 months for follow-up.

## 2023-07-29 NOTE — Progress Notes (Signed)
Established Patient Office Visit     CC/Reason for Visit: Follow-up chronic conditions  HPI: Holly Arellano is a 67 y.o. female who is coming in today for the above mentioned reasons. Past Medical History is significant for: Hypertension, hyperlipidemia, impaired glucose tolerance, obesity, vitamin D deficiency.  She is feeling well without major concerns or complaints.  She has not been taking atorvastatin consistently.  Declines flu vaccine today.   Past Medical/Surgical History: Past Medical History:  Diagnosis Date   Chronic maxillary sinusitis 12/15/2007   Qualifier: Diagnosis of  By: Lovell Sheehan MD, John E    Cough variant asthma 10/19/2008   Qualifier: Diagnosis of  By: Lovell Sheehan MD, Balinda Quails    GERD (gastroesophageal reflux disease)    HEMORRHOIDS, INTERNAL 12/15/2007   Qualifier: Diagnosis of  By: Mayford Knife, [name], Domenic Polite    HIATAL HERNIA 12/15/2007   Qualifier: Diagnosis of  By: Mayford Knife, [name], [name]    Hypertension    MAMMOGRAM, ABNORMAL 08/01/2008   Qualifier: Diagnosis of  By: Mayford Knife [name], Domenic Polite    RAYNAUD'S DISEASE 12/15/2007   Qualifier: Diagnosis of  By: Mayford Knife [name], Domenic Polite     Past Surgical History:  Procedure Laterality Date   CHOLECYSTECTOMY     KNEE SURGERY  1994   ORIF ANKLE FRACTURE Left 02/05/2023   Procedure: OPEN REDUCTION INTERNAL FIXATION (ORIF) LEFT ANKLE FRACTURE W/ SCREWS AND PLATES;  Surgeon: Nadara Mustard, MD;  Location: [facility] OR;  Service: Orthopedics;  Laterality: Left;   trauma to aerm in machine   1996    Social History:  reports that she has never smoked. She has never used smokeless tobacco. She reports that she does not drink alcohol and does not use drugs.  Allergies: Allergies  Allergen Reactions   Latex Rash    Skin rash     Family History:  Family History  Problem Relation Age of Onset   Diabetes Mother    Hypertension Father      Current Outpatient Medications:    valsartan-hydrochlorothiazide (DIOVAN-HCT) 160-25  MG tablet, Take 1 tablet by mouth daily., Disp: 90 tablet, Rfl: 1   amLODipine (NORVASC) 5 MG tablet, Take 1 tablet (5 mg total) by mouth daily., Disp: 90 tablet, Rfl: 1   atorvastatin (LIPITOR) 20 MG tablet, Take 1 tablet (20 mg total) by mouth at bedtime., Disp: 90 tablet, Rfl: 1  Review of Systems:  Negative unless indicated in HPI.   Physical Exam: Vitals:   07/29/23 0717  BP: 130/84  Pulse: 88  Temp: 98 F (36.7 C)  TempSrc: Oral  SpO2: 97%  Weight: 211 lb 14.4 oz (96.1 kg)    Body mass index is 34.2 kg/m.   Physical Exam Vitals reviewed.  Constitutional:      Appearance: Normal appearance.  HENT:     Head: Normocephalic and atraumatic.  Eyes:     Conjunctiva/sclera: Conjunctivae normal.     Pupils: Pupils are equal, round, and reactive to light.  Cardiovascular:     Rate and Rhythm: Normal rate and regular rhythm.  Pulmonary:     Effort: Pulmonary effort is normal.     Breath sounds: Normal breath sounds.  Skin:    General: Skin is warm and dry.  Neurological:     General: No focal deficit present.     Mental Status: She is alert and oriented to person, place, and time.  Psychiatric:        Mood and Affect: Mood normal.  Behavior: Behavior normal.        Thought Content: Thought content normal.        Judgment: Judgment normal.      Impression and Plan:  IGT (impaired glucose tolerance) Assessment & Plan: A1c has increased to 6.1.  We have discussed importance of lifestyle changes.  Orders: -     POCT glycosylated hemoglobin (Hb A1C)  Mixed hyperlipidemia Assessment & Plan: We have discussed being more consistent with atorvastatin, refill sent.  Orders: -     Atorvastatin Calcium; Take 1 tablet (20 mg total) by mouth at bedtime.  Dispense: 90 tablet; Refill: 1  Primary hypertension Assessment & Plan: Not well-controlled on current.  Continue amlodipine 5 mg, add Diovan HCT 160/25 mg.  Continue ambulatory blood pressure measurements and  return in 3 months for follow-up.  Orders: -     Valsartan-hydroCHLOROthiazide; Take 1 tablet by mouth daily.  Dispense: 90 tablet; Refill: 1 -     amLODIPine Besylate; Take 1 tablet (5 mg total) by mouth daily.  Dispense: 90 tablet; Refill: 1     Time spent:32 minutes reviewing chart, interviewing and examining patient and formulating plan of care.     Chaya Jan, MD [facility]

## 2023-07-29 NOTE — Assessment & Plan Note (Signed)
We have discussed being more consistent with atorvastatin, refill sent.

## 2023-07-29 NOTE — Assessment & Plan Note (Signed)
A1c has increased to 6.1.  We have discussed importance of lifestyle changes.

## 2023-08-15 ENCOUNTER — Ambulatory Visit (INDEPENDENT_AMBULATORY_CARE_PROVIDER_SITE_OTHER): Payer: Medicare Other

## 2023-08-15 ENCOUNTER — Telehealth: Payer: Self-pay | Admitting: Orthopedic Surgery

## 2023-08-15 VITALS — Ht 66.0 in | Wt 211.0 lb

## 2023-08-15 DIAGNOSIS — Z0000 Encounter for general adult medical examination without abnormal findings: Secondary | ICD-10-CM | POA: Diagnosis not present

## 2023-08-15 NOTE — Patient Instructions (Addendum)
Holly Arellano , Thank you for taking time to come for your Medicare Wellness Visit. I appreciate your ongoing commitment to your health goals. Please review the following plan we discussed and let me know if I can assist you in the future.   Referrals/Orders/Follow-Ups/Clinician Recommendations:   This is a list of the screening recommended for you and due dates:  Health Maintenance  Topic Date Due   COVID-19 Vaccine (1 - 2023-24 season) Never done   Zoster (Shingles) Vaccine (1 of 2) 10/29/2023*   Flu Shot  12/22/2023*   Pneumonia Vaccine (1 of 1 - PCV) 12/25/2023*   Colon Cancer Screening  12/25/2023*   DTaP/Tdap/Td vaccine (3 - Td or Tdap) 05/18/2024   Medicare Annual Wellness Visit  08/14/2024   Mammogram  09/20/2024   DEXA scan (bone density measurement)  Completed   Hepatitis C Screening  Addressed   HPV Vaccine  Aged Out  *Topic was postponed. The date shown is not the original due date.    Advanced directives: (Declined) Advance directive discussed with you today. Even though you declined this today, please call our office should you change your mind, and we can give you the proper paperwork for you to fill out.  Next Medicare Annual Wellness Visit scheduled for next year: Yes

## 2023-08-15 NOTE — Telephone Encounter (Signed)
Pt requesting new handicap placard her expires this month

## 2023-08-15 NOTE — Telephone Encounter (Signed)
In Dr. Audrie Lia to sign folder. Will call pt once signed. Will placed up front

## 2023-08-15 NOTE — Progress Notes (Signed)
Subjective:   Holly Arellano is a 67 y.o. female who presents for Medicare Annual (Subsequent) preventive examination.  Visit Complete: Virtual I connected with  Sara Lee on 08/15/23 by a audio enabled telemedicine application and verified that I am speaking with the correct person using two identifiers.  Patient Location: Home  Provider Location: Home Office  I discussed the limitations of evaluation and management by telemedicine. The patient expressed understanding and agreed to proceed.  Vital Signs: Because this visit was a virtual/telehealth visit, some criteria may be missing or patient reported. Any vitals not documented were not able to be obtained and vitals that have been documented are patient reported.    Cardiac Risk Factors include: advanced age (>53men, >7 women);hypertension     Objective:    Today's Vitals   08/15/23 1004  Weight: 211 lb (95.7 kg)  Height: 5\' 6"  (1.676 m)   Body mass index is 34.06 kg/m.     08/15/2023   10:12 AM 03/12/2023    9:36 AM 02/05/2023   10:16 AM 02/07/2022   11:56 PM 07/18/2014   10:21 PM  Advanced Directives  Does Patient Have a Medical Advance Directive? No No No No No  Would patient like information on creating a medical advance directive? No - Patient declined No - Patient declined No - Patient declined  No - patient declined information    Current Medications (verified) Outpatient Encounter Medications as of 08/15/2023  Medication Sig   amLODipine (NORVASC) 5 MG tablet Take 1 tablet (5 mg total) by mouth daily.   atorvastatin (LIPITOR) 20 MG tablet Take 1 tablet (20 mg total) by mouth at bedtime.   valsartan-hydrochlorothiazide (DIOVAN-HCT) 160-25 MG tablet Take 1 tablet by mouth daily.   No facility-administered encounter medications on file as of 08/15/2023.    Allergies (verified) Latex   History: Past Medical History:  Diagnosis Date   Chronic maxillary sinusitis 12/15/2007   Qualifier:  Diagnosis of  By: Lovell Sheehan MD, John E    Cough variant asthma 10/19/2008   Qualifier: Diagnosis of  By: Lovell Sheehan MD, Balinda Quails    GERD (gastroesophageal reflux disease)    HEMORRHOIDS, INTERNAL 12/15/2007   Qualifier: Diagnosis of  By: Mayford Knife, [name], Domenic Polite    HIATAL HERNIA 12/15/2007   Qualifier: Diagnosis of  By: Mayford Knife, [name], [name]    Hypertension    MAMMOGRAM, ABNORMAL 08/01/2008   Qualifier: Diagnosis of  By: Mayford Knife [name], Domenic Polite    RAYNAUD'S DISEASE 12/15/2007   Qualifier: Diagnosis of  By: Mayford Knife [name], Domenic Polite    Past Surgical History:  Procedure Laterality Date   CHOLECYSTECTOMY     KNEE SURGERY  1994   ORIF ANKLE FRACTURE Left 02/05/2023   Procedure: OPEN REDUCTION INTERNAL FIXATION (ORIF) LEFT ANKLE FRACTURE W/ SCREWS AND PLATES;  Surgeon: Nadara Mustard, MD;  Location: [facility] OR;  Service: Orthopedics;  Laterality: Left;   trauma to aerm in machine   1996   Family History  Problem Relation Age of Onset   Diabetes Mother    Hypertension Father    Social History   Socioeconomic History   Marital status: Married    Spouse name: Not on file   Number of children: Not on file   Years of education: Not on file   Highest education level: 12th grade  Occupational History   Not on file  Tobacco Use   Smoking status: Never   Smokeless tobacco: Never  Vaping Use   Vaping  status: Never Used  Substance and Sexual Activity   Alcohol use: No   Drug use: No   Sexual activity: Not on file  Other Topics Concern   Not on file  Social History Narrative   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      Home Situation: lives with husband      Spiritual Beliefs: Christian      Lifestyle: no regular exercise; diet is so so            Social Determinants of Corporate investment banker Strain: Low Risk  (08/15/2023)   Overall Financial Resource Strain (CARDIA)    Difficulty of Paying Living Expenses: Not hard at all  Food Insecurity: No Food Insecurity (08/15/2023)   Hunger Vital Sign     Worried About Running Out of Food in the Last Year: Never true    Ran Out of Food in the Last Year: Never true  Transportation Needs: No Transportation Needs (08/15/2023)   PRAPARE - Administrator, Civil Service (Medical): No    Lack of Transportation (Non-Medical): No  Physical Activity: Inactive (08/15/2023)   Exercise Vital Sign    Days of Exercise per Week: 0 days    Minutes of Exercise per Session: 0 min  Stress: No Stress Concern Present (08/15/2023)   [employer] of Occupational Health - Occupational Stress Questionnaire    Feeling of Stress : Not at all  Social Connections: Socially Integrated (08/15/2023)   Social Connection and Isolation Panel [NHANES]    Frequency of Communication with Friends and Family: More than three times a week    Frequency of Social Gatherings with Friends and Family: More than three times a week    Attends Religious Services: More than 4 times per year    Active Member of [employer] or Organizations: Yes    Attends Engineer, structural: More than 4 times per year    Marital Status: Married    Tobacco Counseling Counseling given: Not Answered   Clinical Intake:  Pre-visit preparation completed: Yes  Pain : No/denies pain     BMI - recorded: 34.06 Nutritional Status: BMI > 30  Obese Nutritional Risks: None Diabetes: No  How often do you need to have someone help you when you read instructions, pamphlets, or other written materials from your doctor or pharmacy?: 1 - Never  Interpreter Needed?: No  Information entered by :: Theresa Mulligan [name]   Activities of Daily Living    08/15/2023   10:10 AM 02/05/2023   10:24 AM  In your present state of health, do you have any difficulty performing the following activities:  Hearing? 0   Vision? 0   Difficulty concentrating or making decisions? 0   Walking or climbing stairs? 0   Dressing or bathing? 0   Doing errands, shopping? 0 1  Preparing Food and eating ? N    Using the Toilet? N   In the past six months, have you accidently leaked urine? N   Do you have problems with loss of bowel control? N   Managing your Medications? N   Managing your Finances? N   Housekeeping or managing your Housekeeping? N     Patient Care Team: Philip Aspen, Limmie Patricia, MD as PCP - General (Internal Medicine)  Indicate any recent Medical Services you may have received from other than [facility] providers in the past year (date may be approximate).     Assessment:   This is a routine wellness  examination for Holly Arellano.  Hearing/Vision screen Hearing Screening - Comments:: Denies hearing difficulties   Vision Screening - Comments:: Wears rx glasses - up to date with routine eye exams with  [facility]   Goals Addressed               This Visit's Progress     Increase physical activity (pt-stated)        Eating healthy.       Depression Screen    08/15/2023   10:10 AM 08/15/2023   10:09 AM 07/29/2023    7:22 AM 04/24/2023    7:42 AM 12/25/2022    7:33 AM 09/25/2022    8:06 AM 06/25/2022    8:27 AM  PHQ 2/9 Scores  PHQ - 2 Score 0 0 0 0 0 0 0  PHQ- 9 Score 0 0 1 0 1 2 1     Fall Risk    08/15/2023   10:10 AM 07/29/2023    7:22 AM 04/24/2023    7:42 AM 09/25/2022    8:06 AM 06/25/2022    8:27 AM  Fall Risk   Falls in the past year? 1 1 1  0 0  Number falls in past yr: 0 0 0 0 0  Injury with Fall? 1 1 1  0 0  Comment Fx: Left ankle. Followed by medical attention      Risk for fall due to : No Fall Risks   No Fall Risks No Fall Risks  Follow up Falls prevention discussed Falls evaluation completed Falls evaluation completed Falls evaluation completed Falls evaluation completed    MEDICARE RISK AT HOME: Medicare Risk at Home Any stairs in or around the home?: No If so, are there any without handrails?: No Home free of loose throw rugs in walkways, pet beds, electrical cords, etc?: Yes Adequate lighting in your home to reduce risk of falls?: Yes Life  alert?: No Use of a cane, walker or w/c?: No Grab bars in the bathroom?: No Shower chair or bench in shower?: No Elevated toilet seat or a handicapped toilet?: Yes  TIMED UP AND GO:  Was the test performed?  No    Cognitive Function:        08/15/2023   10:13 AM 08/15/2023   10:12 AM  6CIT Screen  What Year? 0 points 0 points  What month? 0 points 0 points  What time? 0 points 0 points  Count back from 20 0 points 0 points  Months in reverse 0 points 0 points  Repeat phrase 0 points 0 points  Total Score 0 points 0 points    Immunizations Immunization History  Administered Date(s) Administered   PPD Test 09/07/2018   Td 11/21/1997   Tdap 05/18/2014    TDAP status: Up to date  Flu Vaccine status: Declined, Education has been provided regarding the importance of this vaccine but patient still declined. Advised may receive this vaccine at local pharmacy or Health Dept. Aware to provide a copy of the vaccination record if obtained from local pharmacy or Health Dept. Verbalized acceptance and understanding.  Pneumococcal vaccine status: Declined,  Education has been provided regarding the importance of this vaccine but patient still declined. Advised may receive this vaccine at local pharmacy or Health Dept. Aware to provide a copy of the vaccination record if obtained from local pharmacy or Health Dept. Verbalized acceptance and understanding.   Covid-19 vaccine status: Declined, Education has been provided regarding the importance of this vaccine but patient still declined. Advised  may receive this vaccine at local pharmacy or Health Dept.or vaccine clinic. Aware to provide a copy of the vaccination record if obtained from local pharmacy or Health Dept. Verbalized acceptance and understanding.  Qualifies for Shingles Vaccine? Yes   Zostavax completed No   Shingrix Completed?: No.    Education has been provided regarding the importance of this vaccine. Patient has been  advised to call insurance company to determine out of pocket expense if they have not yet received this vaccine. Advised may also receive vaccine at local pharmacy or Health Dept. Verbalized acceptance and understanding.  Screening Tests Health Maintenance  Topic Date Due   COVID-19 Vaccine (1 - 2023-24 season) Never done   Zoster Vaccines- Shingrix (1 of 2) 10/29/2023 (Originally 05/05/2006)   INFLUENZA VACCINE  12/22/2023 (Originally 04/24/2023)   Pneumonia Vaccine 39+ Years old (1 of 1 - PCV) 12/25/2023 (Originally 05/05/2021)   Colonoscopy  12/25/2023 (Originally 09/30/2016)   DTaP/Tdap/Td (3 - Td or Tdap) 05/18/2024   Medicare Annual Wellness (AWV)  08/14/2024   MAMMOGRAM  09/20/2024   DEXA SCAN  Completed   Hepatitis C Screening  Addressed   HPV VACCINES  Aged Out    Health Maintenance  Health Maintenance Due  Topic Date Due   COVID-19 Vaccine (1 - 2023-24 season) Never done    Colorectal cancer screening: Referral to GI placed Deferred. Pt aware the office will call re: appt.  Mammogram status: Completed 09/20/22. Repeat every year  Bone Density status: Completed 12/24/21. Results reflect: Bone density results: NORMAL. Repeat every   years.    Additional Screening:  Hepatitis C Screening: does qualify; Completed 09/19/15  Vision Screening: Recommended annual ophthalmology exams for early detection of glaucoma and other disorders of the eye. Is the patient up to date with their annual eye exam?  Yes  Who is the provider or what is the name of the office in which the patient attends annual eye exams? [facility] If pt is not established with a provider, would they like to be referred to a provider to establish care? No .   Dental Screening: Recommended annual dental exams for proper oral hygiene    Community Resource Referral / Chronic Care Management:  CRR required this visit?  No   CCM required this visit?  No     Plan:     I have personally reviewed and noted  the following in the patient's chart:   Medical and social history Use of alcohol, tobacco or illicit drugs  Current medications and supplements including opioid prescriptions. Patient is not currently taking opioid prescriptions. Functional ability and status Nutritional status Physical activity Advanced directives List of other physicians Hospitalizations, surgeries, and ER visits in previous 12 months Vitals Screenings to include cognitive, depression, and falls Referrals and appointments  In addition, I have reviewed and discussed with patient certain preventive protocols, quality metrics, and best practice recommendations. A written personalized care plan for preventive services as well as general preventive health recommendations were provided to patient.     Tillie Rung, [name]   [id number]   After Visit Summary: (MyChart) Due to this being a telephonic visit, the after visit summary with patients personalized plan was offered to patient via MyChart   Nurse Notes: None

## 2023-09-15 DIAGNOSIS — K080 Exfoliation of teeth due to systemic causes: Secondary | ICD-10-CM | POA: Diagnosis not present

## 2023-09-23 DIAGNOSIS — H524 Presbyopia: Secondary | ICD-10-CM | POA: Diagnosis not present

## 2023-10-01 DIAGNOSIS — K080 Exfoliation of teeth due to systemic causes: Secondary | ICD-10-CM | POA: Diagnosis not present

## 2023-10-03 DIAGNOSIS — Z1231 Encounter for screening mammogram for malignant neoplasm of breast: Secondary | ICD-10-CM | POA: Diagnosis not present

## 2023-10-03 LAB — HM MAMMOGRAPHY

## 2023-10-06 ENCOUNTER — Encounter: Payer: Self-pay | Admitting: Internal Medicine

## 2023-10-14 DIAGNOSIS — R922 Inconclusive mammogram: Secondary | ICD-10-CM | POA: Diagnosis not present

## 2023-10-14 DIAGNOSIS — N6311 Unspecified lump in the right breast, upper outer quadrant: Secondary | ICD-10-CM | POA: Diagnosis not present

## 2023-10-15 ENCOUNTER — Encounter: Payer: Self-pay | Admitting: Internal Medicine

## 2023-10-29 ENCOUNTER — Ambulatory Visit: Payer: Medicare Other | Admitting: Internal Medicine

## 2023-12-03 ENCOUNTER — Encounter: Payer: Self-pay | Admitting: Internal Medicine

## 2023-12-03 ENCOUNTER — Ambulatory Visit (INDEPENDENT_AMBULATORY_CARE_PROVIDER_SITE_OTHER): Payer: Medicare Other | Admitting: Internal Medicine

## 2023-12-03 VITALS — BP 120/84 | HR 90 | Temp 98.2°F | Wt 213.7 lb

## 2023-12-03 DIAGNOSIS — I10 Essential (primary) hypertension: Secondary | ICD-10-CM

## 2023-12-03 DIAGNOSIS — E782 Mixed hyperlipidemia: Secondary | ICD-10-CM

## 2023-12-03 DIAGNOSIS — R7302 Impaired glucose tolerance (oral): Secondary | ICD-10-CM

## 2023-12-03 LAB — POCT GLYCOSYLATED HEMOGLOBIN (HGB A1C): Hemoglobin A1C: 6.2 % — AB (ref 4.0–5.6)

## 2023-12-03 NOTE — Progress Notes (Signed)
 Established Patient Office Visit     CC/Reason for Visit: Follow-up chronic conditions  HPI: Holly Arellano is a 68 y.o. female who is coming in today for the above mentioned reasons. Past Medical History is significant for: Hypertension, hyperlipidemia, impaired glucose tolerance, vitamin D deficiency, obesity.  She has been feeling well without concerns or complaints.   Past Medical/Surgical History: Past Medical History:  Diagnosis Date   Chronic maxillary sinusitis 12/15/2007   Qualifier: Diagnosis of  By: Lovell Sheehan MD, John E    Cough variant asthma 10/19/2008   Qualifier: Diagnosis of  By: Lovell Sheehan MD, Balinda Quails    GERD (gastroesophageal reflux disease)    HEMORRHOIDS, INTERNAL 12/15/2007   Qualifier: Diagnosis of  By: Mayford Knife, [name], Domenic Polite    HIATAL HERNIA 12/15/2007   Qualifier: Diagnosis of  By: Mayford Knife, [name], [name]    Hypertension    MAMMOGRAM, ABNORMAL 08/01/2008   Qualifier: Diagnosis of  By: Mayford Knife [name], Domenic Polite    RAYNAUD'S DISEASE 12/15/2007   Qualifier: Diagnosis of  By: Mayford Knife [name], Domenic Polite     Past Surgical History:  Procedure Laterality Date   CHOLECYSTECTOMY     KNEE SURGERY  1994   ORIF ANKLE FRACTURE Left 02/05/2023   Procedure: OPEN REDUCTION INTERNAL FIXATION (ORIF) LEFT ANKLE FRACTURE W/ SCREWS AND PLATES;  Surgeon: Nadara Mustard, MD;  Location: [facility] OR;  Service: Orthopedics;  Laterality: Left;   trauma to aerm in machine   1996    Social History:  reports that she has never smoked. She has never used smokeless tobacco. She reports that she does not drink alcohol and does not use drugs.  Allergies: Allergies  Allergen Reactions   Latex Rash    Skin rash     Family History:  Family History  Problem Relation Age of Onset   Diabetes Mother    Hypertension Father      Current Outpatient Medications:    amLODipine (NORVASC) 5 MG tablet, Take 1 tablet (5 mg total) by mouth daily., Disp: 90 tablet, Rfl: 1   atorvastatin (LIPITOR) 20  MG tablet, Take 1 tablet (20 mg total) by mouth at bedtime., Disp: 90 tablet, Rfl: 1   valsartan-hydrochlorothiazide (DIOVAN-HCT) 160-25 MG tablet, Take 1 tablet by mouth daily., Disp: 90 tablet, Rfl: 1  Review of Systems:  Negative unless indicated in HPI.   Physical Exam: Vitals:   12/03/23 0755  BP: 120/84  Pulse: 90  Temp: 98.2 F (36.8 C)  TempSrc: Oral  SpO2: 98%  Weight: 213 lb 11.2 oz (96.9 kg)    Body mass index is 34.49 kg/m.   Physical Exam Vitals reviewed.  Constitutional:      Appearance: Normal appearance. She is obese.  HENT:     Head: Normocephalic and atraumatic.  Eyes:     Conjunctiva/sclera: Conjunctivae normal.  Cardiovascular:     Rate and Rhythm: Normal rate and regular rhythm.  Pulmonary:     Effort: Pulmonary effort is normal.     Breath sounds: Normal breath sounds.  Skin:    General: Skin is warm and dry.  Neurological:     General: No focal deficit present.     Mental Status: She is alert and oriented to person, place, and time.  Psychiatric:        Mood and Affect: Mood normal.        Behavior: Behavior normal.        Thought Content: Thought content normal.  Judgment: Judgment normal.      Impression and Plan:  IGT (impaired glucose tolerance) -     POCT glycosylated hemoglobin (Hb A1C)  Primary hypertension  Mixed hyperlipidemia   -Blood pressure fairly well-controlled on current. -A1c has increased to 6.2, we have discussed lifestyle changes in earnest in order to hopefully prevent progression to frank diabetes. -She has not been taking atorvastatin 20 mg that was prescribed to her.  She will resume now.  Time spent:31 minutes reviewing chart, interviewing and examining patient and formulating plan of care.     Chaya Jan, MD  Primary [facility] at [facility]

## 2024-01-08 ENCOUNTER — Other Ambulatory Visit (INDEPENDENT_AMBULATORY_CARE_PROVIDER_SITE_OTHER): Payer: Self-pay

## 2024-01-08 ENCOUNTER — Ambulatory Visit: Admitting: Orthopedic Surgery

## 2024-01-08 DIAGNOSIS — M25572 Pain in left ankle and joints of left foot: Secondary | ICD-10-CM

## 2024-01-20 ENCOUNTER — Encounter: Payer: Self-pay | Admitting: Orthopedic Surgery

## 2024-01-20 NOTE — Progress Notes (Signed)
 Office Visit Note   Patient: Holly Arellano           Date of Birth: [birth year]           MRN: [id number] Visit Date: 01/08/2024              Requested by: Zilphia Hilt, Charyl Coppersmith, MD [address] [place],  [place] [address] PCP: Zilphia Hilt, Charyl Coppersmith, MD  Chief Complaint  Patient presents with   Left Ankle - Pain    S/p ORIF left ankle 1 year out       HPI: Patient is a 68 year old woman who is 1 year status post open reduction internal fixation bimalleolar left ankle fracture.  Patient states she has increased pain medially and laterally worse with walking and standing.  She states she occasionally wears compression socks.  Assessment & Plan: Visit Diagnoses:  1. Pain in left ankle and joints of left foot     Plan: Patient states she would like to exercise before considering a steroid injection.  She will work on range of motion of her ankle and strength and will try Voltaren gel topically.  Follow-Up Instructions: Return if symptoms worsen or fail to improve.   Ortho Exam  Patient is alert, oriented, no adenopathy, well-dressed, normal affect, normal respiratory effort. Examination patient has a palpable pulse she has no crepitation with passive range of motion.  Anterior drawer is stable.  She has swelling around the ankle with tenderness to palpation anteriorly over the joint line.  Most recent hemoglobin A1c 6.2.  Imaging: No results found. No images are attached to the encounter.  Labs: Lab Results  Component Value Date   HGBA1C 6.2 (A) 12/03/2023   HGBA1C 6.1 (A) 07/29/2023   HGBA1C 5.8 (A) 04/24/2023   LABORGA Normal Upper Respiratory Flora 05/18/2013   LABORGA No Beta Hemolytic Streptococci Isolated 05/18/2013     Lab Results  Component Value Date   ALBUMIN 4.2 06/25/2022   ALBUMIN 4.2 02/07/2022   ALBUMIN 4.2 07/02/2021    No results found for: "MG" Lab Results  Component Value Date   VD25OH 46.13 09/25/2022   VD25OH 27.89 (L)  06/25/2022   VD25OH 43.37 10/23/2021    No results found for: "PREALBUMIN"    Latest Ref Rng & Units 02/05/2023    9:36 AM 06/25/2022    8:27 AM 02/07/2022   11:59 PM  CBC EXTENDED  WBC 4.0 - 10.5 K/uL 5.1  3.7  6.2   RBC 3.87 - 5.11 MIL/uL 4.55  4.65  4.76   Hemoglobin 12.0 - 15.0 g/dL 86.5  78.4  69.6   HCT 36.0 - 46.0 % 39.4  39.8  41.3   Platelets 150 - 400 K/uL 195  198.0  233   NEUT# 1.4 - 7.7 K/uL  1.9  2.2   Lymph# 0.7 - 4.0 K/uL  1.4  3.4      There is no height or weight on file to calculate BMI.  Orders:  Orders Placed This Encounter  Procedures   XR Ankle Complete Left   No orders of the defined types were placed in this encounter.    Procedures: No procedures performed  Clinical Data: No additional findings.  ROS:  All other systems negative, except as noted in the HPI. Review of Systems  Objective: Vital Signs: There were no vitals taken for this visit.  Specialty Comments:  No specialty comments available.  PMFS History: Patient Active Problem List   Diagnosis Date  Noted   Closed bimalleolar fracture of left ankle 02/05/2023   Vitamin D  deficiency 04/25/2020   IGT (impaired glucose tolerance) 04/25/2020   Obesity, unspecified 05/18/2014   Hyperlipemia 05/18/2014   Hyperglycemia 05/18/2014   Primary hypertension 12/15/2007   Allergic rhinitis 12/15/2007   GERD 12/15/2007   Past Medical History:  Diagnosis Date   Chronic maxillary sinusitis 12/15/2007   Qualifier: Diagnosis of  By: Larrie Po MD, Wilmon Hashimoto    Cough variant asthma 10/19/2008   Qualifier: Diagnosis of  By: Larrie Po MD, Wilmon Hashimoto    GERD (gastroesophageal reflux disease)    HEMORRHOIDS, INTERNAL 12/15/2007   Qualifier: Diagnosis of  By: Broadus Canes, [name], Magdaleno Schooling    HIATAL HERNIA 12/15/2007   Qualifier: Diagnosis of  By: Broadus Canes, [name], [name]    Hypertension    MAMMOGRAM, ABNORMAL 08/01/2008   Qualifier: Diagnosis of  By: Broadus Canes [name], Magdaleno Schooling    RAYNAUD'S DISEASE 12/15/2007    Qualifier: Diagnosis of  By: Broadus Canes [name], Magdaleno Schooling     Family History  Problem Relation Age of Onset   Diabetes Mother    Hypertension Father     Past Surgical History:  Procedure Laterality Date   CHOLECYSTECTOMY     KNEE SURGERY  1994   ORIF ANKLE FRACTURE Left 02/05/2023   Procedure: OPEN REDUCTION INTERNAL FIXATION (ORIF) LEFT ANKLE FRACTURE W/ SCREWS AND PLATES;  Surgeon: Timothy Ford, MD;  Location: [facility] OR;  Service: Orthopedics;  Laterality: Left;   trauma to aerm in machine   1996   Social History   Occupational History   Not on file  Tobacco Use   Smoking status: Never   Smokeless tobacco: Never  Vaping Use   Vaping status: Never Used  Substance and Sexual Activity   Alcohol use: No   Drug use: No   Sexual activity: Not on file

## 2024-01-25 ENCOUNTER — Other Ambulatory Visit: Payer: Self-pay | Admitting: Internal Medicine

## 2024-01-25 DIAGNOSIS — I10 Essential (primary) hypertension: Secondary | ICD-10-CM

## 2024-01-25 DIAGNOSIS — E782 Mixed hyperlipidemia: Secondary | ICD-10-CM

## 2024-01-26 ENCOUNTER — Other Ambulatory Visit: Payer: Self-pay | Admitting: Internal Medicine

## 2024-01-26 DIAGNOSIS — I10 Essential (primary) hypertension: Secondary | ICD-10-CM

## 2024-02-06 DIAGNOSIS — G8929 Other chronic pain: Secondary | ICD-10-CM | POA: Diagnosis not present

## 2024-03-23 ENCOUNTER — Encounter: Admitting: Internal Medicine

## 2024-06-19 LAB — HEMOGLOBIN A1C: Hemoglobin A1C: 6.2

## 2024-06-19 LAB — AMB RESULTS CONSOLE CBG: Glucose: 124

## 2024-06-19 NOTE — Progress Notes (Signed)
 Pt attended 06/19/24 screening event where her bp was 145/90, blood glucose was 124 and her A1c was 6.2. Pt documented that she does have a PCP and Insurance through [employer].Pt also noted that she does not have any SDOH needs at this time.

## 2024-07-13 ENCOUNTER — Ambulatory Visit: Admitting: Internal Medicine

## 2024-07-13 ENCOUNTER — Encounter: Payer: Self-pay | Admitting: Internal Medicine

## 2024-07-13 VITALS — BP 128/78 | HR 73 | Temp 98.2°F | Ht 66.5 in | Wt 211.5 lb

## 2024-07-13 DIAGNOSIS — E782 Mixed hyperlipidemia: Secondary | ICD-10-CM | POA: Diagnosis not present

## 2024-07-13 DIAGNOSIS — E785 Hyperlipidemia, unspecified: Secondary | ICD-10-CM

## 2024-07-13 DIAGNOSIS — Z0000 Encounter for general adult medical examination without abnormal findings: Secondary | ICD-10-CM

## 2024-07-13 DIAGNOSIS — Z1211 Encounter for screening for malignant neoplasm of colon: Secondary | ICD-10-CM

## 2024-07-13 DIAGNOSIS — R739 Hyperglycemia, unspecified: Secondary | ICD-10-CM

## 2024-07-13 DIAGNOSIS — E559 Vitamin D deficiency, unspecified: Secondary | ICD-10-CM | POA: Diagnosis not present

## 2024-07-13 DIAGNOSIS — R7302 Impaired glucose tolerance (oral): Secondary | ICD-10-CM

## 2024-07-13 DIAGNOSIS — Z124 Encounter for screening for malignant neoplasm of cervix: Secondary | ICD-10-CM

## 2024-07-13 DIAGNOSIS — Z780 Asymptomatic menopausal state: Secondary | ICD-10-CM

## 2024-07-13 DIAGNOSIS — I10 Essential (primary) hypertension: Secondary | ICD-10-CM

## 2024-07-13 LAB — CBC WITH DIFFERENTIAL/PLATELET
Basophils Absolute: 0 K/uL (ref 0.0–0.1)
Basophils Relative: 0.4 % (ref 0.0–3.0)
Eosinophils Absolute: 0 K/uL (ref 0.0–0.7)
Eosinophils Relative: 1.1 % (ref 0.0–5.0)
HCT: 38.8 % (ref 36.0–46.0)
Hemoglobin: 13.2 g/dL (ref 12.0–15.0)
Lymphocytes Relative: 51 % — ABNORMAL HIGH (ref 12.0–46.0)
Lymphs Abs: 1.9 K/uL (ref 0.7–4.0)
MCHC: 34.1 g/dL (ref 30.0–36.0)
MCV: 85.3 fl (ref 78.0–100.0)
Monocytes Absolute: 0.2 K/uL (ref 0.1–1.0)
Monocytes Relative: 6.2 % (ref 3.0–12.0)
Neutro Abs: 1.6 K/uL (ref 1.4–7.7)
Neutrophils Relative %: 41.3 % — ABNORMAL LOW (ref 43.0–77.0)
Platelets: 222 K/uL (ref 150.0–400.0)
RBC: 4.55 Mil/uL (ref 3.87–5.11)
RDW: 13.6 % (ref 11.5–15.5)
WBC: 3.8 K/uL — ABNORMAL LOW (ref 4.0–10.5)

## 2024-07-13 LAB — VITAMIN B12: Vitamin B-12: 406 pg/mL (ref 211–911)

## 2024-07-13 LAB — COMPREHENSIVE METABOLIC PANEL WITH GFR
ALT: 13 U/L (ref 0–35)
AST: 15 U/L (ref 0–37)
Albumin: 4.3 g/dL (ref 3.5–5.2)
Alkaline Phosphatase: 67 U/L (ref 39–117)
BUN: 16 mg/dL (ref 6–23)
CO2: 26 meq/L (ref 19–32)
Calcium: 9 mg/dL (ref 8.4–10.5)
Chloride: 106 meq/L (ref 96–112)
Creatinine, Ser: 0.77 mg/dL (ref 0.40–1.20)
GFR: 79.39 mL/min (ref >60.00)
Glucose, Bld: 95 mg/dL (ref 70–99)
Potassium: 3.7 meq/L (ref 3.5–5.1)
Sodium: 140 meq/L (ref 135–145)
Total Bilirubin: 0.7 mg/dL (ref 0.2–1.2)
Total Protein: 6.7 g/dL (ref 6.0–8.3)

## 2024-07-13 LAB — LIPID PANEL
Cholesterol: 186 mg/dL (ref 0–200)
HDL: 35 mg/dL — ABNORMAL LOW (ref >39.00)
LDL Cholesterol: 127 mg/dL — ABNORMAL HIGH (ref 0–99)
NonHDL: 150.99
Total CHOL/HDL Ratio: 5
Triglycerides: 118 mg/dL (ref 0.0–149.0)
VLDL: 23.6 mg/dL (ref 0.0–40.0)

## 2024-07-13 LAB — HEMOGLOBIN A1C: Hgb A1c MFr Bld: 6.1 % (ref 4.6–6.5)

## 2024-07-13 LAB — VITAMIN D 25 HYDROXY (VIT D DEFICIENCY, FRACTURES): VITD: 23.25 ng/mL — ABNORMAL LOW (ref 30.00–100.00)

## 2024-07-13 LAB — TSH: TSH: 1.33 u[IU]/mL (ref 0.35–5.50)

## 2024-07-13 NOTE — Progress Notes (Signed)
 Established Patient Office Visit     CC/Reason for Visit: Annual preventive exam  HPI: Holly Arellano is a 68 y.o. female who is coming in today for the above mentioned reasons. Past Medical History is significant for: Hypertension, impaired glucose tolerance, hyperlipidemia, obesity, vitamin D  deficiency.  Feeling well, no concerns or complaints.  Has routine eye and dental care.  Is due for all age-appropriate vaccines including COVID, flu, pneumonia, Tdap, shingles.  Is overdue for colonoscopy, had a mammogram in January, is overdue for DEXA scan and GYN follow-up.   Past Medical/Surgical History: Past Medical History:  Diagnosis Date   Chronic maxillary sinusitis 12/15/2007   Qualifier: Diagnosis of  By: Holly Arellano, Holly Arellano    Cough variant asthma 10/19/2008   Qualifier: Diagnosis of  By: Holly Arellano, Holly Arellano Holly Arellano    GERD (gastroesophageal reflux disease)    HEMORRHOIDS, INTERNAL 12/15/2007   Qualifier: Diagnosis of  By: [name], Holly Arellano, Holly Arellano Holly Arellano    HIATAL HERNIA 12/15/2007   Qualifier: Diagnosis of  By: [name], Holly Arellano, Holly Arellano M    Hypertension    MAMMOGRAM, ABNORMAL 08/01/2008   Qualifier: Diagnosis of  By: Holly Arellano, Holly Arellano Holly Arellano    RAYNAUD'S DISEASE 12/15/2007   Qualifier: Diagnosis of  By: Holly Arellano, Holly Arellano Holly Arellano     Past Surgical History:  Procedure Laterality Date   CHOLECYSTECTOMY     KNEE SURGERY  1994   ORIF ANKLE FRACTURE Left 02/05/2023   Procedure: OPEN REDUCTION INTERNAL FIXATION (ORIF) LEFT ANKLE FRACTURE W/ SCREWS AND PLATES;  Surgeon: Holly Holly Arellano GAILS, Arellano;  Location: [facility] OR;  Service: Orthopedics;  Laterality: Left;   trauma to aerm in machine   1996    Social History:  reports that she has never smoked. She has never used smokeless tobacco. She reports that she does not drink alcohol and does not use drugs.  Allergies: Allergies  Allergen Reactions   Latex Rash    Skin rash     Family History:  Family History  Problem Relation Age of Onset   Diabetes  Mother    Hypertension Father      Current Outpatient Medications:    amLODipine  (NORVASC ) 5 MG tablet, TAKE 1 TABLET(5 MG) BY MOUTH DAILY, Disp: 90 tablet, Rfl: 0   atorvastatin  (LIPITOR) 20 MG tablet, TAKE 1 TABLET(20 MG) BY MOUTH AT BEDTIME (Patient not taking: Reported on 07/13/2024), Disp: 90 tablet, Rfl: 1   valsartan -hydrochlorothiazide  (DIOVAN -HCT) 160-25 MG tablet, TAKE 1 TABLET BY MOUTH DAILY (Patient not taking: Reported on 07/13/2024), Disp: 90 tablet, Rfl: 1  Review of Systems:  Negative unless indicated in HPI.   Physical Exam: Vitals:   07/13/24 1302 07/13/24 1306 07/13/24 1319  BP: (!) 140/84 (!) 147/81 128/78  Pulse: 73    Temp: 98.2 F (36.8 C)    TempSrc: Oral    SpO2: 99%    Weight: 211 lb 8 oz (95.9 kg)    Height: 5' 6.5 (1.689 m)      Body mass index is 33.63 kg/m.   Physical Exam Vitals reviewed.  Constitutional:      General: She is not in acute distress.    Appearance: Normal appearance. She is not ill-appearing, toxic-appearing or diaphoretic.  HENT:     Head: Normocephalic.     Right Ear: Tympanic membrane, ear canal and external ear normal. There is no impacted cerumen.     Left Ear: Tympanic membrane, ear canal and external ear normal. There is no impacted cerumen.  Nose: Nose normal.     Mouth/Throat:     Mouth: Mucous membranes are moist.     Pharynx: Oropharynx is clear. No oropharyngeal exudate or posterior oropharyngeal erythema.  Eyes:     General: No scleral icterus.       Right eye: No discharge.        Left eye: No discharge.     Conjunctiva/sclera: Conjunctivae normal.     Pupils: Pupils are equal, round, and reactive to light.  Neck:     Vascular: No carotid bruit.  Cardiovascular:     Rate and Rhythm: Normal rate and regular rhythm.     Pulses: Normal pulses.     Heart sounds: Normal heart sounds.  Pulmonary:     Effort: Pulmonary effort is normal. No respiratory distress.     Breath sounds: Normal breath sounds.   Abdominal:     General: Abdomen is flat. Bowel sounds are normal.     Palpations: Abdomen is soft.  Musculoskeletal:        General: Normal range of motion.     Cervical back: Normal range of motion.  Skin:    General: Skin is warm and dry.  Neurological:     General: No focal deficit present.     Mental Status: She is alert and oriented to person, place, and time. Mental status is at baseline.  Psychiatric:        Mood and Affect: Mood normal.        Behavior: Behavior normal.        Thought Content: Thought content normal.        Judgment: Judgment normal.      Impression and Plan:  Encounter for preventive health examination  Primary hypertension -     Comprehensive metabolic panel with GFR; Future -     CBC with Differential/Platelet; Future  Mixed hyperlipidemia -     Lipid panel; Future  IGT (impaired glucose tolerance) -     Hemoglobin A1c; Future  Vitamin D  deficiency -     TSH; Future -     Vitamin B12; Future -     VITAMIN D  25 Hydroxy (Vit-D Deficiency, Fractures); Future  Hyperglycemia  Hyperlipidemia, unspecified hyperlipidemia type  Screening for malignant neoplasm of colon -     Ambulatory referral to Gastroenterology  Postmenopausal estrogen deficiency -     DG Bone Density; Future  Screening for cervical cancer -     Ambulatory referral to Gynecology  -Recommend routine eye and dental care. -Healthy lifestyle discussed in detail. -Labs to be updated today. -Prostate cancer screening: Not applicable Health Maintenance  Topic Date Due   Pneumococcal Vaccine for age over 66 (1 of 1 - PCV) Never done   Zoster (Shingles) Vaccine (1 of 2) Never done   Colon Cancer Screening  09/30/2016   DTaP/Tdap/Td vaccine (3 - Td or Tdap) 05/18/2024   COVID-19 Vaccine (1 - 2025-26 season) Never done   Medicare Annual Wellness Visit  08/14/2024   Flu Shot  12/21/2024*   Breast Cancer Screening  10/02/2025   DEXA scan (bone density measurement)  Completed    Hepatitis C Screening  Addressed   Meningitis B Vaccine  Aged Out  *Topic was postponed. The date shown is not the original due date.     -Denies all age-appropriate vaccines despite counseling. - Referrals placed for GI, GYN and bone density.    Tully Theophilus Andrews, Arellano [facility]

## 2024-07-14 ENCOUNTER — Ambulatory Visit: Payer: Self-pay | Admitting: Internal Medicine

## 2024-07-14 DIAGNOSIS — E559 Vitamin D deficiency, unspecified: Secondary | ICD-10-CM

## 2024-07-14 DIAGNOSIS — E782 Mixed hyperlipidemia: Secondary | ICD-10-CM

## 2024-07-14 MED ORDER — VITAMIN D (ERGOCALCIFEROL) 1.25 MG (50000 UNIT) PO CAPS: 50000.0000 [IU] | ORAL_CAPSULE | ORAL | 0 refills | Status: AC

## 2024-07-14 MED ORDER — ATORVASTATIN CALCIUM 20 MG PO TABS: 20.0000 mg | ORAL_TABLET | Freq: Every day | ORAL | 1 refills | Status: AC

## 2024-07-16 NOTE — Progress Notes (Signed)
 Pt attended 06/19/2024 screening event with BP of 145/90, blood sugar was 124, and A1C was 6.2. Pt noted at event that she does have a PCP. At event pt did not indicate any SDOH needs. Pt also noted that she is not a smoker and listed Medicare as her insurance at the event.   Per initial f/u pt was reached out via phone and was left vm. Pt was also mailed abnormal results letter per initial f/u.   Per chart review pt does have a PCP (Estela Y. Theophilus Andrews; [facility] [place]), insurance, and is not a smoker. Pt's last appt with PCP was 07/13/2024 and has upcoming appts on 08/16/2024 & 10/13/2024 . Pt does not indicate any SDOH needs at this time.  No additional pt f/u to be scheduled at this time per health equity protocol.

## 2024-07-22 ENCOUNTER — Other Ambulatory Visit (INDEPENDENT_AMBULATORY_CARE_PROVIDER_SITE_OTHER): Payer: Self-pay

## 2024-07-22 ENCOUNTER — Ambulatory Visit (INDEPENDENT_AMBULATORY_CARE_PROVIDER_SITE_OTHER): Admitting: Orthopedic Surgery

## 2024-07-22 DIAGNOSIS — M25572 Pain in left ankle and joints of left foot: Secondary | ICD-10-CM | POA: Diagnosis not present

## 2024-07-23 ENCOUNTER — Encounter: Payer: Self-pay | Admitting: Orthopedic Surgery

## 2024-07-23 NOTE — Progress Notes (Signed)
 Office Visit Note   Patient: Holly Arellano           Date of Birth: 05/25/1956           MRN: [id number] Visit Date: 07/22/2024              Requested by: Holly Arellano, Holly GRADE, MD [address] [place],  [place] [address] PCP: Holly Arellano, Holly GRADE, MD  Chief Complaint  Patient presents with   Left Ankle - Follow-up      HPI: Discussed the use of AI scribe software for clinical note transcription with the patient, who gave verbal consent to proceed.  History of Present Illness Holly Arellano is a 68 year old female who presents with ankle swelling and tingling in the toes.  She has been experiencing swelling around her ankle and tingling in her toes. The ankle is sore to touch, particularly on the bones, and the toes feel 'a little tingly'.  She has been exercising and is unsure if this is contributing to the swelling. No significant pain while walking, but there is soreness upon palpation.  She is currently wearing compression socks.     Assessment & Plan: Visit Diagnoses:  1. Pain in left ankle and joints of left foot     Plan: Assessment and Plan Assessment & Plan Left ankle and foot joint pain with swelling Mild swelling and tenderness in left ankle, good range of motion, tingling in toes likely from swelling. - Order x-ray of left ankle to rule out fractures or significant structural issues. - Advise wearing compression socks to manage swelling. - Recommend wearing sneakers for stability and to prevent further injury. - Provide handicap parking permit.      Follow-Up Instructions: Return if symptoms worsen or fail to improve.   Ortho Exam  Patient is alert, oriented, no adenopathy, well-dressed, normal affect, normal respiratory effort. Physical Exam EXTREMITIES: Palpable dorsalis pedis pulse, good range of motion of the ankle. Anterior joint line of the ankle with swelling and mild tenderness. Mild tenderness to palpation of the  medial and lateral malleolus.      Imaging: No results found. No images are attached to the encounter.  Labs: Lab Results  Component Value Date   HGBA1C 6.1 07/13/2024   HGBA1C 6.2 06/19/2024   HGBA1C 6.2 (A) 12/03/2023   LABORGA Normal Upper Respiratory Flora 05/18/2013   LABORGA No Beta Hemolytic Streptococci Isolated 05/18/2013     Lab Results  Component Value Date   ALBUMIN 4.3 07/13/2024   ALBUMIN 4.2 06/25/2022   ALBUMIN 4.2 02/07/2022    No results found for: MG Lab Results  Component Value Date   VD25OH 23.25 (L) 07/13/2024   VD25OH 46.13 09/25/2022   VD25OH 27.89 (L) 06/25/2022    No results found for: PREALBUMIN    Latest Ref Rng & Units 07/13/2024    1:20 PM 02/05/2023    9:36 AM 06/25/2022    8:27 AM  CBC EXTENDED  WBC 4.0 - 10.5 K/uL 3.8  5.1  3.7   RBC 3.87 - 5.11 Mil/uL 4.55  4.55  4.65   Hemoglobin 12.0 - 15.0 g/dL 86.7  87.0  86.4   HCT 36.0 - 46.0 % 38.8  39.4  39.8   Platelets 150.0 - 400.0 K/uL 222.0  195  198.0   NEUT# 1.4 - 7.7 K/uL 1.6   1.9   Lymph# 0.7 - 4.0 K/uL 1.9   1.4      There is  no height or weight on file to calculate BMI.  Orders:  Orders Placed This Encounter  Procedures   XR Ankle Complete Left   No orders of the defined types were placed in this encounter.    Procedures: No procedures performed  Clinical Data: No additional findings.  ROS:  All other systems negative, except as noted in the HPI. Review of Systems  Objective: Vital Signs: There were no vitals taken for this visit.  Specialty Comments:  No specialty comments available.  PMFS History: Patient Active Problem List   Diagnosis Date Noted   Closed bimalleolar fracture of left ankle 02/05/2023   Vitamin D  deficiency 04/25/2020   IGT (impaired glucose tolerance) 04/25/2020   Obesity, unspecified 05/18/2014   Hyperlipemia 05/18/2014   Hyperglycemia 05/18/2014   Primary hypertension 12/15/2007   Allergic rhinitis 12/15/2007   GERD  12/15/2007   Past Medical History:  Diagnosis Date   Chronic maxillary sinusitis 12/15/2007   Qualifier: Diagnosis of  By: [name], [name]    Cough variant asthma 10/19/2008   Qualifier: Diagnosis of  By: [name], [name]    GERD (gastroesophageal reflux disease)    HEMORRHOIDS, INTERNAL 12/15/2007   Qualifier: Diagnosis of  By: [name], [name], [name]    HIATAL HERNIA 12/15/2007   Qualifier: Diagnosis of  By: [name], [name], [name]    Hypertension    MAMMOGRAM, ABNORMAL 08/01/2008   Qualifier: Diagnosis of  By: [name] [name], [name]    RAYNAUD'S DISEASE 12/15/2007   Qualifier: Diagnosis of  By: [name] [name], [name]     Family History  Problem Relation Age of Onset   Diabetes Mother    Hypertension Father     Past Surgical History:  Procedure Laterality Date   CHOLECYSTECTOMY     KNEE SURGERY  1994   ORIF ANKLE FRACTURE Left 02/05/2023   Procedure: OPEN REDUCTION INTERNAL FIXATION (ORIF) LEFT ANKLE FRACTURE W/ SCREWS AND PLATES;  Surgeon: Harden Jerona GAILS, MD;  Location: [facility] OR;  Service: Orthopedics;  Laterality: Left;   trauma to aerm in machine   1996   Social History   Occupational History   Not on file  Tobacco Use   Smoking status: Never   Smokeless tobacco: Never  Vaping Use   Vaping status: Never Used  Substance and Sexual Activity   Alcohol use: No   Drug use: No   Sexual activity: Not on file

## 2024-07-26 ENCOUNTER — Encounter: Payer: Self-pay | Admitting: Radiology

## 2024-08-16 ENCOUNTER — Ambulatory Visit (INDEPENDENT_AMBULATORY_CARE_PROVIDER_SITE_OTHER)

## 2024-08-16 VITALS — BP 124/64 | Temp 98.1°F | Ht 66.5 in | Wt 210.0 lb

## 2024-08-16 DIAGNOSIS — Z1211 Encounter for screening for malignant neoplasm of colon: Secondary | ICD-10-CM

## 2024-08-16 DIAGNOSIS — Z0000 Encounter for general adult medical examination without abnormal findings: Secondary | ICD-10-CM | POA: Diagnosis not present

## 2024-08-16 NOTE — Patient Instructions (Addendum)
 Holly Arellano,  Thank you for taking the time for your Medicare Wellness Visit. I appreciate your continued commitment to your health goals. Please review the care plan we discussed, and feel free to reach out if I can assist you further.  Please note that Annual Wellness Visits do not include a physical exam. Some assessments may be limited, especially if the visit was conducted virtually. If needed, we may recommend an in-person follow-up with your provider.  Ongoing Care Seeing your primary care provider every 3 to 6 months helps us  monitor your health and provide consistent, personalized care.   Referrals If a referral was made during today's visit and you haven't received any updates within two weeks, please contact the referred provider directly to check on the status.  Recommended Screenings:  Health Maintenance  Topic Date Due   Pneumococcal Vaccine for age over 57 (1 of 1 - PCV) Never done   Zoster (Shingles) Vaccine (1 of 2) Never done   Colon Cancer Screening  09/30/2016   DTaP/Tdap/Td vaccine (3 - Td or Tdap) 05/18/2024   COVID-19 Vaccine (1 - 2025-26 season) Never done   Flu Shot  12/21/2024*   Medicare Annual Wellness Visit  08/16/2025   Breast Cancer Screening  10/02/2025   Osteoporosis screening with Bone Density Scan  Completed   Hepatitis C Screening  Addressed   Meningitis B Vaccine  Aged Out  *Topic was postponed. The date shown is not the original due date.       08/16/2024   10:04 AM  Advanced Directives  Does Patient Have a Medical Advance Directive? No  Would patient like information on creating a medical advance directive? No - Patient declined    Vision: Annual vision screenings are recommended for early detection of glaucoma, cataracts, and diabetic retinopathy. These exams can also reveal signs of chronic conditions such as diabetes and high blood pressure.  Dental: Annual dental screenings help detect early signs of oral cancer, gum disease, and other  conditions linked to overall health, including heart disease and diabetes.  Please see the attached documents for additional preventive care recommendations.

## 2024-08-16 NOTE — Progress Notes (Addendum)
 Chief Complaint  Patient presents with   Medicare Wellness     Subjective:   Holly Arellano is a 68 y.o. female who presents for a [employer] Visit.  Allergies (verified) Latex   History: Past Medical History:  Diagnosis Date   Chronic maxillary sinusitis 12/15/2007   Qualifier: Diagnosis of  By: [name], John E    Cough variant asthma 10/19/2008   Qualifier: Diagnosis of  By: [name], [name]    GERD (gastroesophageal reflux disease)    HEMORRHOIDS, INTERNAL 12/15/2007   Qualifier: Diagnosis of  By: [name], [name], [name]    HIATAL HERNIA 12/15/2007   Qualifier: Diagnosis of  By: [name], [name], [name]    Hypertension    MAMMOGRAM, ABNORMAL 08/01/2008   Qualifier: Diagnosis of  By: [name] [name], [name]    RAYNAUD'S DISEASE 12/15/2007   Qualifier: Diagnosis of  By: [name] [name], [name]    Past Surgical History:  Procedure Laterality Date   CHOLECYSTECTOMY     KNEE SURGERY  1994   ORIF ANKLE FRACTURE Left 02/05/2023   Procedure: OPEN REDUCTION INTERNAL FIXATION (ORIF) LEFT ANKLE FRACTURE W/ SCREWS AND PLATES;  Surgeon: Harden Jerona GAILS, MD;  Location: [facility] OR;  Service: Orthopedics;  Laterality: Left;   trauma to aerm in machine   1996   Family History  Problem Relation Age of Onset   Diabetes Mother    Hypertension Father    Social History   Occupational History   Not on file  Tobacco Use   Smoking status: Never   Smokeless tobacco: Never  Vaping Use   Vaping status: Never Used  Substance and Sexual Activity   Alcohol use: No   Drug use: No   Sexual activity: Not on file   Tobacco Counseling Counseling given: No  SDOH Screenings   Food Insecurity: No Food Insecurity (08/16/2024)  Housing: Unknown (08/16/2024)  Transportation Needs: No Transportation Needs (08/16/2024)  Utilities: Not At Risk (08/16/2024)  Alcohol Screen: Low Risk  (08/15/2023)  Depression (PHQ2-9): Low Risk  (08/16/2024)  Financial Resource Strain: Low Risk   (07/09/2024)  Physical Activity: Insufficiently Active (08/16/2024)  Social Connections: Socially Integrated (08/16/2024)  Stress: No Stress Concern Present (08/16/2024)  Tobacco Use: Low Risk  (08/16/2024)  Health Literacy: Adequate Health Literacy (08/16/2024)   See flowsheets for full screening details  Depression Screen PHQ 2 & 9 Depression Scale- Over the past 2 weeks, how often have you been bothered by any of the following problems? Little interest or pleasure in doing things: 0 Feeling down, depressed, or hopeless (PHQ Adolescent also includes...irritable): 0 PHQ-2 Total Score: 0     Goals Addressed               This Visit's Progress     Increase physical activity (pt-stated)        Remain active       Visit info / Clinical Intake: Medicare Wellness Visit Type:: Subsequent Annual Wellness Visit Persons participating in visit:: patient Medicare Wellness Visit Mode:: In-person (required for WTM) Information given by:: patient Interpreter Needed?: No Pre-visit prep was completed: yes AWV questionnaire completed by patient prior to visit?: yes Date:: 08/15/24 Living arrangements:: lives with spouse/significant other Patient's Overall Health Status Rating: very good Typical amount of pain: some (Left arm pain) Does pain affect daily life?: no Are you currently prescribed opioids?: no  Dietary Habits and Nutritional Risks How many meals a day?: 3 Eats fruit and vegetables daily?: yes  Most meals are obtained by: preparing own meals In the last 2 weeks, have you had any of the following?: none Diabetic:: no  Functional Status Activities of Daily Living (to include ambulation/medication): Independent Ambulation: Independent with device- listed below Home Assistive Devices/Equipment: Eyeglasses; Dentures (specify type) Medication Administration: Independent Home Management: Independent Manage your own finances?: yes Primary transportation is:  driving Concerns about vision?: no *vision screening is required for WTM* Concerns about hearing?: no  Fall Screening Falls in the past year?: 0 Number of falls in past year: 0 Was there an injury with Fall?: 0 Fall Risk Category Calculator: 0 Patient Fall Risk Level: Low Fall Risk  Fall Risk Patient at Risk for Falls Due to: No Fall Risks Fall risk Follow up: Falls evaluation completed  Home and Transportation Safety: All rugs have non-skid backing?: N/A, no rugs All stairs or steps have railings?: N/A, no stairs Grab bars in the bathtub or shower?: (!) no Have non-skid surface in bathtub or shower?: yes Good home lighting?: yes Regular seat belt use?: yes Hospital stays in the last year:: no  Cognitive Assessment Difficulty concentrating, remembering, or making decisions? : no Will 6CIT or Mini Cog be Completed: no 6CIT or Mini Cog Declined: patient alert, oriented, able to answer questions appropriately and recall recent events  Advance Directives (For Healthcare) Does Patient Have a Medical Advance Directive?: No Would patient like information on creating a medical advance directive?: No - Patient declined  Reviewed/Updated  Reviewed/Updated: Reviewed All (Medical, Surgical, Family, Medications, Allergies, Care Teams, Patient Goals)        Objective:    Today's Vitals   08/16/24 0953  BP: 124/64  Temp: 98.1 F (36.7 C)  TempSrc: Oral  SpO2: 97%  Weight: 210 lb (95.3 kg)  Height: 5' 6.5 (1.689 m)   Body mass index is 33.39 kg/m.  Current Medications (verified) Outpatient Encounter Medications as of 08/16/2024  Medication Sig   amLODipine  (NORVASC ) 5 MG tablet TAKE 1 TABLET(5 MG) BY MOUTH DAILY   atorvastatin  (LIPITOR) 20 MG tablet Take 1 tablet (20 mg total) by mouth daily.   valsartan -hydrochlorothiazide  (DIOVAN -HCT) 160-25 MG tablet TAKE 1 TABLET BY MOUTH DAILY (Patient not taking: Reported on 07/13/2024)   Vitamin D , Ergocalciferol , (DRISDOL ) 1.25 MG  (50000 UNIT) CAPS capsule Take 1 capsule (50,000 Units total) by mouth every 7 (seven) days for 12 doses.   No facility-administered encounter medications on file as of 08/16/2024.   Hearing/Vision screen Hearing Screening - Comments:: Denies hearing difficulties   Vision Screening - Comments:: Wears rx glasses - up to date with routine eye exams with  [facility] Immunizations and Health Maintenance Health Maintenance  Topic Date Due   Pneumococcal Vaccine: 50+ Years (1 of 1 - PCV) Never done   Zoster Vaccines- Shingrix (1 of 2) Never done   Colonoscopy  09/30/2016   DTaP/Tdap/Td (3 - Td or Tdap) 05/18/2024   COVID-19 Vaccine (1 - 2025-26 season) Never done   Influenza Vaccine  12/21/2024 (Originally 04/23/2024)   Medicare Annual Wellness (AWV)  08/16/2025   Mammogram  10/02/2025   Bone Density Scan  Completed   Hepatitis C Screening  Addressed   Meningococcal B Vaccine  Aged Out        Assessment/Plan:  This is a routine wellness examination for Holly Arellano.  Patient Care Team: Theophilus Andrews, Tully GRADE, MD as PCP - General (Internal Medicine)  I have personally reviewed and noted the following in the patient's chart:   Medical and social  history Use of alcohol, tobacco or illicit drugs  Current medications and supplements including opioid prescriptions. Functional ability and status Nutritional status Physical activity Advanced directives List of other physicians Hospitalizations, surgeries, and ER visits in previous 12 months Vitals Screenings to include cognitive, depression, and falls Referrals and appointments  Orders Placed This Encounter  Procedures   Ambulatory referral to Gastroenterology    Referral Priority:   Routine    Referral Type:   Consultation    Referral Reason:   Specialty Services Required    Number of Visits Requested:   1   In addition, I have reviewed and discussed with patient certain preventive protocols, quality metrics, and best practice  recommendations. A written personalized care plan for preventive services as well as general preventive health recommendations were provided to patient.   Rojelio LELON Blush, [name]   [id number]/75/7974   Return in 1 year on 08/22/25  After Visit Summary: (MyChart) Due to this being a telephonic visit, the after visit summary with patients personalized plan was offered to patient via MyChart   Nurse Notes: None

## 2024-10-04 ENCOUNTER — Other Ambulatory Visit: Payer: Self-pay | Admitting: Internal Medicine

## 2024-10-04 DIAGNOSIS — E559 Vitamin D deficiency, unspecified: Secondary | ICD-10-CM

## 2024-10-08 LAB — HM MAMMOGRAPHY

## 2024-10-11 ENCOUNTER — Encounter: Payer: Self-pay | Admitting: Internal Medicine

## 2024-10-13 ENCOUNTER — Ambulatory Visit: Admitting: Internal Medicine

## 2024-10-13 ENCOUNTER — Encounter: Payer: Self-pay | Admitting: Internal Medicine

## 2024-10-13 VITALS — BP 145/80 | HR 73 | Temp 98.2°F | Wt 209.0 lb

## 2024-10-13 DIAGNOSIS — E782 Mixed hyperlipidemia: Secondary | ICD-10-CM | POA: Diagnosis not present

## 2024-10-13 DIAGNOSIS — R7302 Impaired glucose tolerance (oral): Secondary | ICD-10-CM | POA: Diagnosis not present

## 2024-10-13 DIAGNOSIS — I10 Essential (primary) hypertension: Secondary | ICD-10-CM

## 2024-10-13 LAB — POCT GLYCOSYLATED HEMOGLOBIN (HGB A1C): Hemoglobin A1C: 6.1 % — AB (ref 4.0–5.6)

## 2024-10-13 NOTE — Assessment & Plan Note (Signed)
 We have discussed being more consistent with atorvastatin, refill sent.

## 2024-10-13 NOTE — Progress Notes (Signed)
 "    Established Patient Office Visit     CC/Reason for Visit: Follow-up chronic conditions  HPI: Holly Arellano is a 69 y.o. female who is coming in today for the above mentioned reasons. Past Medical History is significant for: Hypertension, hyperlipidemia, impaired glucose tolerance, obesity, vitamin D  deficiency.  No acute concerns or complaints.  She states she is only taking amlodipine , not taking Diovan  HCT or amlodipine  for reasons that are unclear to me.   Past Medical/Surgical History: Past Medical History:  Diagnosis Date   Chronic maxillary sinusitis 12/15/2007   Qualifier: Diagnosis of  By: [name], John E    Cough variant asthma 10/19/2008   Qualifier: Diagnosis of  By: [name], [name]    GERD (gastroesophageal reflux disease)    HEMORRHOIDS, INTERNAL 12/15/2007   Qualifier: Diagnosis of  By: [name], [name], [name]    HIATAL HERNIA 12/15/2007   Qualifier: Diagnosis of  By: [name], [name], [name]    Hypertension    MAMMOGRAM, ABNORMAL 08/01/2008   Qualifier: Diagnosis of  By: [name] [name], [name]    RAYNAUD'S DISEASE 12/15/2007   Qualifier: Diagnosis of  By: [name] [name], [name]     Past Surgical History:  Procedure Laterality Date   CHOLECYSTECTOMY     KNEE SURGERY  1994   ORIF ANKLE FRACTURE Left 02/05/2023   Procedure: OPEN REDUCTION INTERNAL FIXATION (ORIF) LEFT ANKLE FRACTURE W/ SCREWS AND PLATES;  Surgeon: Harden Jerona GAILS, MD;  Location: [facility] OR;  Service: Orthopedics;  Laterality: Left;   trauma to aerm in machine   1996    Social History:  reports that she has never smoked. She has never used smokeless tobacco. She reports that she does not drink alcohol and does not use drugs.  Allergies: Allergies[1]  Family History:  Family History  Problem Relation Age of Onset   Diabetes Mother    Hypertension Father     Current Medications[2]  Review of Systems:  Negative unless indicated in HPI.   Physical Exam: Vitals:   10/13/24 0728  10/13/24 0733  BP: (!) 160/100 (!) 145/80  Pulse: 73   Temp: 98.2 F (36.8 C)   TempSrc: Oral   SpO2: 98%   Weight: 209 lb (94.8 kg)     Body mass index is 33.23 kg/m.   Physical Exam Vitals reviewed.  Constitutional:      Appearance: Normal appearance. She is obese.  HENT:     Head: Normocephalic and atraumatic.  Eyes:     Conjunctiva/sclera: Conjunctivae normal.  Cardiovascular:     Rate and Rhythm: Normal rate and regular rhythm.  Pulmonary:     Effort: Pulmonary effort is normal.     Breath sounds: Normal breath sounds.  Skin:    General: Skin is warm and dry.  Neurological:     General: No focal deficit present.     Mental Status: She is alert and oriented to person, place, and time.  Psychiatric:        Mood and Affect: Mood normal.        Behavior: Behavior normal.        Thought Content: Thought content normal.        Judgment: Judgment normal.      Impression and Plan:  IGT (impaired glucose tolerance) Assessment & Plan: A1c has increased to 6.1.  We have discussed importance of lifestyle changes.  Orders: -     POCT glycosylated hemoglobin (Hb A1C)  Mixed hyperlipidemia Assessment &  Plan: We have discussed being more consistent with atorvastatin , refill sent.   Primary hypertension Assessment & Plan: Not well-controlled.  She f freely admits to not taking Diovan  HCT and I am unclear if she has been taking amlodipine  as pharmacy refills note that she has not been filling it appropriately.  Continue ambulatory blood pressure measurements and return in 3 months for follow-up.  We have discussed importance of compliance with blood pressure regimen.      Time spent:32 minutes reviewing chart, interviewing and examining patient and formulating plan of care.     Tully Theophilus Andrews, MD [facility]     [1]  Allergies Allergen Reactions   Latex Rash    Skin rash   [2]  Current Outpatient Medications:     amLODipine  (NORVASC ) 5 MG tablet, TAKE 1 TABLET(5 MG) BY MOUTH DAILY, Disp: 90 tablet, Rfl: 0   atorvastatin  (LIPITOR) 20 MG tablet, Take 1 tablet (20 mg total) by mouth daily. (Patient not taking: Reported on 10/13/2024), Disp: 90 tablet, Rfl: 1   valsartan -hydrochlorothiazide  (DIOVAN -HCT) 160-25 MG tablet, TAKE 1 TABLET BY MOUTH DAILY (Patient not taking: Reported on 10/13/2024), Disp: 90 tablet, Rfl: 1  "

## 2024-10-13 NOTE — Assessment & Plan Note (Signed)
 Not well-controlled.  She f freely admits to not taking Diovan  HCT and I am unclear if she has been taking amlodipine  as pharmacy refills note that she has not been filling it appropriately.  Continue ambulatory blood pressure measurements and return in 3 months for follow-up.  We have discussed importance of compliance with blood pressure regimen.

## 2024-10-13 NOTE — Assessment & Plan Note (Signed)
 A1c has increased to 6.1.  We have discussed importance of lifestyle changes.

## 2025-01-11 ENCOUNTER — Ambulatory Visit: Admitting: Internal Medicine

## 2025-06-01 ENCOUNTER — Other Ambulatory Visit (HOSPITAL_BASED_OUTPATIENT_CLINIC_OR_DEPARTMENT_OTHER)

## 2025-08-22 ENCOUNTER — Ambulatory Visit
